# Patient Record
Sex: Male | Born: 1985 | Race: Black or African American | Hispanic: No | Marital: Single | Smoking: Current every day smoker
Health system: Southern US, Community
[De-identification: ages and names within clinical notes are randomized; demographics above are authoritative.]

## PROBLEM LIST (undated history)

## (undated) DIAGNOSIS — R112 Nausea with vomiting, unspecified: Secondary | ICD-10-CM

## (undated) DIAGNOSIS — F1290 Cannabis use, unspecified, uncomplicated: Secondary | ICD-10-CM

## (undated) DIAGNOSIS — B20 Human immunodeficiency virus [HIV] disease: Secondary | ICD-10-CM

## (undated) DIAGNOSIS — J45909 Unspecified asthma, uncomplicated: Secondary | ICD-10-CM

## (undated) DIAGNOSIS — K859 Acute pancreatitis without necrosis or infection, unspecified: Secondary | ICD-10-CM

## (undated) DIAGNOSIS — Z21 Asymptomatic human immunodeficiency virus [HIV] infection status: Secondary | ICD-10-CM

## (undated) DIAGNOSIS — F1910 Other psychoactive substance abuse, uncomplicated: Secondary | ICD-10-CM

## (undated) HISTORY — PX: NO PAST SURGERIES: SHX2092

---

## 2013-11-24 ENCOUNTER — Emergency Department: Payer: Self-pay | Admitting: Emergency Medicine

## 2013-11-24 LAB — COMPREHENSIVE METABOLIC PANEL
ALK PHOS: 76 U/L
ALT: 120 U/L — AB (ref 12–78)
Albumin: 4.2 g/dL (ref 3.4–5.0)
Anion Gap: 10 (ref 7–16)
BUN: 8 mg/dL (ref 7–18)
Bilirubin,Total: 0.5 mg/dL (ref 0.2–1.0)
CALCIUM: 9.6 mg/dL (ref 8.5–10.1)
CO2: 25 mmol/L (ref 21–32)
Chloride: 102 mmol/L (ref 98–107)
Creatinine: 1.31 mg/dL — ABNORMAL HIGH (ref 0.60–1.30)
EGFR (Non-African Amer.): >60
Glucose: 101 mg/dL — ABNORMAL HIGH (ref 65–99)
Osmolality: 272 (ref 275–301)
Potassium: 3.7 mmol/L (ref 3.5–5.1)
SGOT(AST): 135 U/L — ABNORMAL HIGH (ref 15–37)
Sodium: 137 mmol/L (ref 136–145)
Total Protein: 9.4 g/dL — ABNORMAL HIGH (ref 6.4–8.2)

## 2013-11-24 LAB — URINALYSIS, COMPLETE
BACTERIA: NONE SEEN
Blood: NEGATIVE
Glucose,UR: NEGATIVE mg/dL (ref 0–75)
Leukocyte Esterase: NEGATIVE
Nitrite: NEGATIVE
Ph: 6 (ref 4.5–8.0)
Protein: >=500
RBC,UR: 5 /HPF (ref 0–5)
SQUAMOUS EPITHELIAL: NONE SEEN
Specific Gravity: 1.03 (ref 1.003–1.030)
WBC UR: 1 /HPF (ref 0–5)

## 2013-11-24 LAB — CBC WITH DIFFERENTIAL/PLATELET
BASOS PCT: 0.5 %
Basophil #: 0.1 10*3/uL (ref 0.0–0.1)
EOS ABS: 0 10*3/uL (ref 0.0–0.7)
EOS PCT: 0.1 %
HCT: 51.1 % (ref 40.0–52.0)
HGB: 16.9 g/dL (ref 13.0–18.0)
Lymphocyte #: 6.4 10*3/uL — ABNORMAL HIGH (ref 1.0–3.6)
Lymphocyte %: 31.1 %
MCH: 29.8 pg (ref 26.0–34.0)
MCHC: 33 g/dL (ref 32.0–36.0)
MCV: 90 fL (ref 80–100)
MONO ABS: 1.3 x10 3/mm — AB (ref 0.2–1.0)
Monocyte %: 6.5 %
NEUTROS ABS: 12.8 10*3/uL — AB (ref 1.4–6.5)
Neutrophil %: 61.8 %
PLATELETS: 240 10*3/uL (ref 150–440)
RBC: 5.67 10*6/uL (ref 4.40–5.90)
RDW: 14.3 % (ref 11.5–14.5)
WBC: 20.7 10*3/uL — AB (ref 3.8–10.6)

## 2013-11-24 LAB — LIPASE, BLOOD: Lipase: 89 U/L (ref 73–393)

## 2013-11-24 LAB — ETHANOL
ETHANOL %: 0.041 % (ref 0.000–0.080)
ETHANOL LVL: 41 mg/dL

## 2014-07-27 ENCOUNTER — Encounter (HOSPITAL_COMMUNITY): Payer: Self-pay | Admitting: Emergency Medicine

## 2014-07-27 ENCOUNTER — Emergency Department (HOSPITAL_COMMUNITY): Payer: Self-pay

## 2014-07-27 ENCOUNTER — Emergency Department (HOSPITAL_COMMUNITY)
Admission: EM | Admit: 2014-07-27 | Discharge: 2014-07-27 | Disposition: A | Discharge status: Home / Self Care | Payer: Self-pay | Attending: Emergency Medicine | Admitting: Emergency Medicine

## 2014-07-27 DIAGNOSIS — J45909 Unspecified asthma, uncomplicated: Secondary | ICD-10-CM | POA: Insufficient documentation

## 2014-07-27 DIAGNOSIS — R1084 Generalized abdominal pain: Secondary | ICD-10-CM | POA: Insufficient documentation

## 2014-07-27 DIAGNOSIS — R197 Diarrhea, unspecified: Secondary | ICD-10-CM | POA: Insufficient documentation

## 2014-07-27 HISTORY — DX: Unspecified asthma, uncomplicated: J45.909

## 2014-07-27 LAB — GI PATHOGEN PANEL BY PCR, STOOL
C difficile toxin A/B: NEGATIVE
CAMPYLOBACTER BY PCR: NEGATIVE
Cryptosporidium by PCR: NEGATIVE
E coli (ETEC) LT/ST: NEGATIVE
E coli (STEC): NEGATIVE
E coli 0157 by PCR: NEGATIVE
G LAMBLIA BY PCR: NEGATIVE
NOROVIRUS G1/G2: NEGATIVE
ROTAVIRUS A BY PCR: NEGATIVE
SHIGELLA BY PCR: NEGATIVE
Salmonella by PCR: NEGATIVE

## 2014-07-27 LAB — COMPREHENSIVE METABOLIC PANEL
ALBUMIN: 3.6 g/dL (ref 3.5–5.2)
ALT: 22 U/L (ref 0–53)
AST: 34 U/L (ref 0–37)
Alkaline Phosphatase: 66 U/L (ref 39–117)
Anion gap: 9 (ref 5–15)
BUN: 13 mg/dL (ref 6–23)
CHLORIDE: 100 meq/L (ref 96–112)
CO2: 25 mEq/L (ref 19–32)
CREATININE: 1.1 mg/dL (ref 0.50–1.35)
Calcium: 9.4 mg/dL (ref 8.4–10.5)
GFR calc Af Amer: >90 mL/min (ref >90)
GFR calc non Af Amer: 89 mL/min — ABNORMAL LOW (ref >90)
Glucose, Bld: 110 mg/dL — ABNORMAL HIGH (ref 70–99)
Potassium: 4.4 mEq/L (ref 3.7–5.3)
Sodium: 134 mEq/L — ABNORMAL LOW (ref 137–147)
TOTAL PROTEIN: 8.5 g/dL — AB (ref 6.0–8.3)
Total Bilirubin: 0.4 mg/dL (ref 0.3–1.2)

## 2014-07-27 LAB — CBC WITH DIFFERENTIAL/PLATELET
BASOS ABS: 0 10*3/uL (ref 0.0–0.1)
Basophils Relative: 0 % (ref 0–1)
Eosinophils Absolute: 0 10*3/uL (ref 0.0–0.7)
Eosinophils Relative: 0 % (ref 0–5)
HCT: 49.8 % (ref 39.0–52.0)
Hemoglobin: 16.9 g/dL (ref 13.0–17.0)
Lymphocytes Relative: 35 % (ref 12–46)
Lymphs Abs: 3.7 10*3/uL (ref 0.7–4.0)
MCH: 30.2 pg (ref 26.0–34.0)
MCHC: 33.9 g/dL (ref 30.0–36.0)
MCV: 88.9 fL (ref 78.0–100.0)
MONOS PCT: 8 % (ref 3–12)
Monocytes Absolute: 0.8 10*3/uL (ref 0.1–1.0)
Neutro Abs: 6.1 10*3/uL (ref 1.7–7.7)
Neutrophils Relative %: 57 % (ref 43–77)
PLATELETS: 187 10*3/uL (ref 150–400)
RBC: 5.6 MIL/uL (ref 4.22–5.81)
RDW: 14 % (ref 11.5–15.5)
WBC: 10.6 10*3/uL — AB (ref 4.0–10.5)

## 2014-07-27 LAB — URINALYSIS, ROUTINE W REFLEX MICROSCOPIC
Bilirubin Urine: NEGATIVE
Glucose, UA: NEGATIVE mg/dL
Hgb urine dipstick: NEGATIVE
Ketones, ur: NEGATIVE mg/dL
Leukocytes, UA: NEGATIVE
Nitrite: NEGATIVE
PH: 7 (ref 5.0–8.0)
Protein, ur: NEGATIVE mg/dL
SPECIFIC GRAVITY, URINE: 1.025 (ref 1.005–1.030)
Urobilinogen, UA: 1 mg/dL (ref 0.0–1.0)

## 2014-07-27 LAB — LIPASE, BLOOD: LIPASE: 42 U/L (ref 11–59)

## 2014-07-27 LAB — CLOSTRIDIUM DIFFICILE BY PCR: CDIFFPCR: NEGATIVE

## 2014-07-27 LAB — POC OCCULT BLOOD, ED: FECAL OCCULT BLD: NEGATIVE

## 2014-07-27 NOTE — ED Notes (Signed)
PCR stool collected via stool rectum. 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

## 2014-07-27 NOTE — Discharge Instructions (Signed)
°Emergency Department Resource Guide °1) Find a Doctor and Pay Out of Pocket °Although you won't have to find out who is covered by your insurance plan, it is a good idea to ask around and get recommendations. You will then need to call the office and see if the doctor you have chosen will accept you as a new patient and what types of options they offer for patients who are self-pay. Some doctors offer discounts or will set up payment plans for their patients who do not have insurance, but you will need to ask so you aren't surprised when you get to your appointment. ° °2) Contact Your Local Health Department °Not all health departments have doctors that can see patients for sick visits, but many do, so it is worth a call to see if yours does. If you don't know where your local health department is, you can check in your phone book. The CDC also has a tool to help you locate your state's health department, and many state websites also have listings of all of their local health departments. ° °3) Find a Walk-in Clinic °If your illness is not likely to be very severe or complicated, you may want to try a walk in clinic. These are popping up all over the country in pharmacies, drugstores, and shopping centers. They're usually staffed by nurse practitioners or physician assistants that have been trained to treat common illnesses and complaints. They're usually fairly quick and inexpensive. However, if you have serious medical issues or chronic medical problems, these are probably not your best option. ° °No Primary Care Doctor: °- Call [facility] at  832-8000 - they can help you locate a primary care doctor that  accepts your insurance, provides certain services, etc. °- Physician Referral Service- 1-800-533-3463 ° °Chronic Pain Problems: °Organization         Address  Phone   Notes  °[facility]  (336) 297-2271 Patients need to be referred by their primary care doctor.  ° °Medication  Assistance: °Organization         Address  Phone   Notes  °[place] [facility] [address] °[place], [place] [address] (336) 641-8030 --Must be a resident of [place] °-- Must have NO insurance coverage whatsoever (no Medicaid/ Medicare, etc.) °-- The pt. MUST have a primary care doctor that directs their care regularly and follows them in the community °  °[facility]  (866) 331-1348   °[employer]  (888) 892-1162   ° °Agencies that provide inexpensive medical care: °Organization         Address  Phone   Notes  °[facility]  (336) 832-8035   °[facility]    (336) 832-7272   °[facility] [address] °[place], [place] [address] (336) 832-4777   °[facility] [address], °[place] (336) 271-4999   °[facility]    (336) 373-0678   °[facility]    (336) 272-1050   °[facility] ° [address], [place] Phone:  (336) 832-4444, Fax:  (336) 832-4440 Hours of Operation:  9 am - 6 pm, M-F.  Also accepts Medicaid/Medicare and self-pay.  °[facility] ° [address], [place] Phone: (336) 832-3150, Fax: (336) [phone]. Hours of Operation:  8:30 am - 5:30 pm, M-F.  Also accepts Medicaid and self-pay.  °[facility] [place] [address], [place] Phone: (336) 878-6027   °[facility] [address], [place], [place] (336)723-1848, Ext. 123 Mondays & Thursdays: 7-9 AM.  First 15 patients are seen on a first come, first serve basis. °  ° °Medicaid-accepting [place] Providers: ° °Organization         Address  Phone   Notes  °[facility] [address], [place] (336) 641-2100 Also accepts self-pay patients.  °[facility] [address], [place] ° (336) 856-9996   °[facility] [address], [place]  (336) 288-8857   °[facility] [address] [place] [address], [place] (336) 299-7000   °Veita Bland [address], [place]  ° (336) 373-1557 Only accepts [place] Access Medicaid patients after they have their name applied to their card.  ° °Self-Pay (no insurance) in [place]: ° °Organization         Address  Phone   Notes  °Sickle Cell Patients, [facility] [address], [place] (336) 832-1970   °[facility] [address], [place] (336) 832-4400   °[facility] ° [address], [place] (336) 992-4800   °[facility]/Dr. Osei-Bonsu ° [address] [place] [address], [place] or [address], [place] (336) 841-8500 Phone number for both [place] and [place] locations is the same.  °[facility] [address], [place] (336) 299-0000   °[facility] [address] [place] [address], [place] or [address] (336) 852-7530 °(336) 878-2260   °[facility] [address], [place] (336) 350-1642, phone; (336) 294-5005, fax Sees patients 1st and 3rd Saturday of every month.  Must not qualify for public or private insurance (i.e. Medicaid, Medicare, [place] Health Choice, Veterans' Benefits) • Household income should be no more than 200% of the poverty level •The clinic cannot treat you if you are pregnant or think you are pregnant • Sexually transmitted diseases are not treated at the clinic.  ° ° °Dental Care: °Organization         Address  Phone  Notes  °[place] [facility] [address], [place] (336) 641-6152 Accepts children up to age 21 who are enrolled in Medicaid or [place] Health Choice; pregnant women with a Medicaid card; and children who have applied for Medicaid or [place] Health Choice, but were declined, whose parents can pay a reduced fee at time of service.  °[place]  [facility] [place]  [address], [place] (336) 641-7733 Accepts children up to age 21 who are enrolled in Medicaid or [place] Health Choice; pregnant women with a Medicaid card; and children who have applied for Medicaid or [place] Health Choice, but were declined, whose parents can pay a reduced fee at time of service.  °[facility] ° [address], [place] (336) 641-4533 Patients are seen by appointment only. Walk-ins are not accepted. [facility] will see patients 18 years of age and older. °Monday - Tuesday (8am-5pm) °Most Wednesdays (8:30-5pm) °$30 per visit, cash only  °[facility] ° [address], [place] (336) 641-4533 Patients are seen by appointment only. Walk-ins are not accepted. [facility] will see patients 18 years of age and older. °One   Wednesday Evening (Monthly: Volunteer Based).  $30 per visit, cash only  °[facility]  (919) 537-3737 for adults; Children under age 4, call Graduate Pediatric Dentistry at ([phone]) [phone]. Children aged 4-14, please call ([phone]) [phone] to request a pediatric application. ° Dental services are provided in all areas of dental care including fillings, crowns and bridges, complete and partial dentures, implants, gum treatment, root canals, and extractions. Preventive care is also provided. Treatment is provided to both adults and children. °Patients are selected via a lottery and there is often a waiting list. °  °[facility] [address], °[place] ° (336) 763-8833 [email or web address] °  °[facility] [address], [place], [place] (336)723-1848, Ext. 123 Second and Fourth Thursday of each month, opens at 6:30 AM; Clinic ends at 9 AM.  Patients are seen on a first-come first-served basis, and a limited number are seen during each clinic.  ° °[facility] ° [address], [place], [place] (336) 723-7904    Eligibility Requirements °You must have lived in [place], [place], or [place] counties for at least the last three months. °  You cannot be eligible for state or federal sponsored healthcare insurance, including Veterans Administration, Medicaid, or Medicare. °  You generally cannot be eligible for healthcare insurance through your employer.  °  How to apply: °Eligibility screenings are held every Tuesday and Wednesday afternoon from 1:00 pm until 4:00 pm. You do not need an appointment for the interview!  °[facility] [address], [place], [place] 336-631-2330   °[facility]  336-342-8273   °[facility]  336-703-3100   °[facility]  336-570-6415   ° °Behavioral Health Resources in the Community: °Intensive Outpatient Programs °Organization         Address  Phone  Notes  °[facility] [address], [place], [place] 336-878-6098   °[facility] [address], [place], [place] 336-832-9800   °[facility] [address], [place], [place] ° 336-882-2125   °[facility] [address],  °[place], [place] 1-800-853-5163 or 336-641-4981   °Substance Abuse Resources °Organization         Address  Phone  Notes  °[facility]  336-882-2125   °[facility]  336-784-9470   °[facility]  336-285-9073   °[facility]  336-845-3988   °Residential & Outpatient Substance Abuse Program  1-800-659-3381   °Psychological Services °Organization         Address  Phone  Notes  °[facility]  336- 832-9600   °[facility]  336- 378-7881   °[facility] [address], [place] 1-800-853-5163 or 336-641-4981   ° °Mobile Crisis Teams °Organization         Address  Phone  Notes  °[facility], Mobile Crisis Care Unit  1-877-626-1772   °Assertive °[facility] ° [address]  [place], [place] 336-834-9664   °Sharon DeEsch [address] °[place] 336-554-5454   ° °Self-Help/Support Groups °Organization         Address  Phone             Notes  °[facility] of  - variety of support groups  336- 373-1402 Call for more information  °Narcotics Anonymous (NA), [facility] [address], °[place]  2 meetings at this location  ° °  Residential Treatment Programs Organization         Address  Phone  Notes  [facility] [address],    [place]  [phone]   [facility]  [address], [place] [phone], [place], [place] [phone]   [facility] [address], [place] [phone] Admissions: 8am-3pm M-F  [facility] [address]    [place], [place] [phone]   [facility] [address], [place], [place] [phone]   [facility] [address]  [place], [place] [phone]   [facility] Intensive Outpatient [address], [place], [place] [phone]   [facility] [address]  [place], [place] [phone] or [phone]   [facility] [address] [place], [place] [phone] Accepts Medicaid  [facility] [address]  [place] [phone] Substance Abuse/Addiction Treatment   [facility]         Address  Phone  Notes  [facility]  [phone](888) [phone]   Angie FavaJulie Brannon, PhD [address], [address] [place], [place]   [phone](336) (212)[phone] or [phone](336) [phone]   [facility]   [address] [place], [place] [phone](336) [phone]   [facility] [address], [place], [place] ([phone]) [phone](336) [phone] Insurance/Medicaid/sponsorship through [facility] [address]                                    [place], [place] [phone]) [phone] Therapy/tele-psych/case    [facility] [address]   [place], [place] [phone]) [phone]    Dr. Lolly MustacheArfeen  [phone]) [phone]   [facility] [place]  [facility] 1) [address], [place] 2) [address], [place] 3)  [address], [place] [phone](336) [phone](336) [phone]  [phone](336) [phone]   [facility] Child Abuse Hotline 607-325-0203(336) 620-316-6417 or (915) 191-1733(336) [phone] (After Hours)      May take over the counter anti-diarrheal medicine, as directed on packaging, as needed for diarrhea. Increase your fluid intake (ie:  Gatoraide) for the next few days, as discussed. Avoid full strength juices, as well as milk and milk products until your diarrhea has resolved.  Call your regular medical doctor today to schedule a follow up appointment this week.  Return to the Emergency Department immediately if not improving (or even worsening) despite taking the medicines as prescribed, any black or bloody stool or vomit, if you develop a fever over "101," or for any other concerns.

## 2014-07-27 NOTE — ED Provider Notes (Signed)
CSN: [id number]     Arrival date & time 07/27/14  1011 History   First MD Initiated Contact with Patient 07/27/14 1028     Chief Complaint  Patient presents with  . Abdominal Pain  . Diarrhea     HPI  Pt was seen at 1055. Per pt, c/o gradual onset and persistence of multiple intermittent episodes of diarrhea that began 2 days ago. Describes the stools as "watery" yesterday, but "more formed" today. Has been associated with generalized abd "gurgling" discomfort. Pt had one episode of N/V yesterday, none since. Denies CP/SOB, no back pain, no fevers, no black or blood in stools or emesis.    Past Medical History  Diagnosis Date  . Asthma    History reviewed. No pertinent past surgical history.  History  Substance Use Topics  . Smoking status: Never Smoker   . Smokeless tobacco: Not on file  . Alcohol Use: Yes    Review of Systems ROS: Statement: All systems negative except as marked or noted in the HPI; Constitutional: Negative for fever and chills. ; ; Eyes: Negative for eye pain, redness and discharge. ; ; ENMT: Negative for ear pain, hoarseness, nasal congestion, sinus pressure and sore throat. ; ; Cardiovascular: Negative for chest pain, palpitations, diaphoresis, dyspnea and peripheral edema. ; ; Respiratory: Negative for cough, wheezing and stridor. ; ; Gastrointestinal: +abd pain, diarrhea, N/V x1. Negative for blood in stool, hematemesis, jaundice and rectal bleeding. . ; ; Genitourinary: Negative for dysuria, flank pain and hematuria. ; ; Musculoskeletal: Negative for back pain and neck pain. Negative for swelling and trauma.; ; Skin: Negative for pruritus, rash, abrasions, blisters, bruising and skin lesion.; ; Neuro: Negative for headache, lightheadedness and neck stiffness. Negative for weakness, altered level of consciousness , altered mental status, extremity weakness, paresthesias, involuntary movement, seizure and syncope.      Allergies  Review of patient's allergies  indicates no known allergies.  Home Medications   Prior to Admission medications   Not on File   BP 130/84 mmHg  Pulse 81  Temp(Src) 98.1 F (36.7 C) (Oral)  Resp 18  SpO2 100% Physical Exam  1100: Physical examination:  Nursing notes reviewed; Vital signs and O2 SAT reviewed;  Constitutional: Well developed, Well nourished, Well hydrated, In no acute distress; Head:  Normocephalic, atraumatic; Eyes: EOMI, PERRL, No scleral icterus; ENMT: Mouth and pharynx normal, Mucous membranes moist; Neck: Supple, Full range of motion, No lymphadenopathy; Cardiovascular: Regular rate and rhythm, No murmur, rub, or gallop; Respiratory: Breath sounds clear & equal bilaterally, No rales, rhonchi, wheezes.  Speaking full sentences with ease, Normal respiratory effort/excursion; Chest: Nontender, Movement normal; Abdomen: Soft, Nontender, Nondistended, Normal bowel sounds; Genitourinary: No CVA tenderness; Extremities: Pulses normal, No tenderness, No edema, No calf edema or asymmetry.; Neuro: AA&Ox3, Major CN grossly intact.  Speech clear. No gross focal motor or sensory deficits in extremities. Climbs on and off stretcher easily by himself. Gait steady.; Skin: Color normal, Warm, Dry.   ED Course  Procedures   MDM  MDM Reviewed: previous chart, nursing note and vitals Interpretation: labs and x-ray     Results for orders placed or performed during the hospital encounter of 07/27/14  CBC with Differential  Result Value Ref Range   WBC 10.6 (H) 4.0 - 10.5 K/uL   RBC 5.60 4.22 - 5.81 MIL/uL   Hemoglobin 16.9 13.0 - 17.0 g/dL   HCT 40.949.8 81.139.0 - 91.452.0 %   MCV 88.9 78.0 - 100.0 fL  MCH 30.2 26.0 - 34.0 pg   MCHC 33.9 30.0 - 36.0 g/dL   RDW 40.914.0 81.111.5 - 91.415.5 %   Platelets 187 150 - 400 K/uL   Neutrophils Relative % 57 43 - 77 %   Lymphocytes Relative 35 12 - 46 %   Monocytes Relative 8 3 - 12 %   Eosinophils Relative 0 0 - 5 %   Basophils Relative 0 0 - 1 %   Neutro Abs 6.1 1.7 - 7.7 K/uL    Lymphs Abs 3.7 0.7 - 4.0 K/uL   Monocytes Absolute 0.8 0.1 - 1.0 K/uL   Eosinophils Absolute 0.0 0.0 - 0.7 K/uL   Basophils Absolute 0.0 0.0 - 0.1 K/uL   Smear Review MORPHOLOGY UNREMARKABLE   Comprehensive metabolic panel  Result Value Ref Range   Sodium 134 (L) 137 - 147 mEq/L   Potassium 4.4 3.7 - 5.3 mEq/L   Chloride 100 96 - 112 mEq/L   CO2 25 19 - 32 mEq/L   Glucose, Bld 110 (H) 70 - 99 mg/dL   BUN 13 6 - 23 mg/dL   Creatinine, Ser 7.821.10 0.50 - 1.35 mg/dL   Calcium 9.4 8.4 - 95.610.5 mg/dL   Total Protein 8.5 (H) 6.0 - 8.3 g/dL   Albumin 3.6 3.5 - 5.2 g/dL   AST 34 0 - 37 U/L   ALT 22 0 - 53 U/L   Alkaline Phosphatase 66 39 - 117 U/L   Total Bilirubin 0.4 0.3 - 1.2 mg/dL   GFR calc non Af Amer 89 (L) >90 mL/min   GFR calc Af Amer >90 >90 mL/min   Anion gap 9 5 - 15  Lipase, blood  Result Value Ref Range   Lipase 42 11 - 59 U/L  Urinalysis, Routine w reflex microscopic  Result Value Ref Range   Color, Urine YELLOW YELLOW   APPearance CLEAR CLEAR   Specific Gravity, Urine 1.025 1.005 - 1.030   pH 7.0 5.0 - 8.0   Glucose, UA NEGATIVE NEGATIVE mg/dL   Hgb urine dipstick NEGATIVE NEGATIVE   Bilirubin Urine NEGATIVE NEGATIVE   Ketones, ur NEGATIVE NEGATIVE mg/dL   Protein, ur NEGATIVE NEGATIVE mg/dL   Urobilinogen, UA 1.0 0.0 - 1.0 mg/dL   Nitrite NEGATIVE NEGATIVE   Leukocytes, UA NEGATIVE NEGATIVE  POC occult blood, ED  Result Value Ref Range   Fecal Occult Bld NEGATIVE NEGATIVE   Dg Abd Acute W/chest 07/27/2014   CLINICAL DATA:  Abdominal pain and diarrhea for 2 days.  EXAM: ACUTE ABDOMEN SERIES (ABDOMEN 2 VIEW & CHEST 1 VIEW)  COMPARISON:  None.  FINDINGS: The upright chest x-ray is normal.  No pleural effusion.  Two views of the abdomen demonstrate an unremarkable bowel gas pattern. No findings for obstruction or perforation. The soft tissue shadows are maintained. No worrisome calcifications. The bony structures are intact.  IMPRESSION: No acute cardiopulmonary  findings.  No plain film findings for an acute abdominal process.   Electronically Signed   By: Loralie ChampagneMark  Gallerani M.D.   On: 07/27/2014 12:10    1345:  Pt has tol PO well while in the ED without N/V.  Has stooled several times while in the ED: GI pathogen panel and cdiff PCR pending.  Abd remains benign, VSS. Feels better and wants to go home now. Dx and testing d/w pt.  Questions answered.  Verb understanding, agreeable to d/c home with outpt f/u.      Samuel JesterKathleen Kit Mollett, DO 07/29/14 [phone]

## 2014-07-27 NOTE — ED Notes (Signed)
Pt drinks ice water without complaint.

## 2014-07-27 NOTE — ED Notes (Signed)
Pt c/o abd pain, n/v/d for 2-3 days.  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  Pt states that he gets done having BM then few minutes later having to be back in the bathroom going again.

## 2015-01-04 ENCOUNTER — Encounter (HOSPITAL_BASED_OUTPATIENT_CLINIC_OR_DEPARTMENT_OTHER): Payer: Self-pay | Admitting: *Deleted

## 2015-01-04 ENCOUNTER — Emergency Department (HOSPITAL_BASED_OUTPATIENT_CLINIC_OR_DEPARTMENT_OTHER)
Admission: EM | Admit: 2015-01-04 | Discharge: 2015-01-04 | Disposition: A | Discharge status: Home / Self Care | Payer: Self-pay | Attending: Emergency Medicine | Admitting: Emergency Medicine

## 2015-01-04 DIAGNOSIS — F1010 Alcohol abuse, uncomplicated: Secondary | ICD-10-CM | POA: Insufficient documentation

## 2015-01-04 DIAGNOSIS — Z21 Asymptomatic human immunodeficiency virus [HIV] infection status: Secondary | ICD-10-CM | POA: Insufficient documentation

## 2015-01-04 DIAGNOSIS — Z720 Tobacco use: Secondary | ICD-10-CM | POA: Insufficient documentation

## 2015-01-04 DIAGNOSIS — J45909 Unspecified asthma, uncomplicated: Secondary | ICD-10-CM | POA: Insufficient documentation

## 2015-01-04 DIAGNOSIS — Z79899 Other long term (current) drug therapy: Secondary | ICD-10-CM | POA: Insufficient documentation

## 2015-01-04 DIAGNOSIS — K2920 Alcoholic gastritis without bleeding: Secondary | ICD-10-CM | POA: Insufficient documentation

## 2015-01-04 HISTORY — DX: Human immunodeficiency virus (HIV) disease: B20

## 2015-01-04 HISTORY — DX: Asymptomatic human immunodeficiency virus (hiv) infection status: Z21

## 2015-01-04 LAB — URINALYSIS, ROUTINE W REFLEX MICROSCOPIC
BILIRUBIN URINE: NEGATIVE
GLUCOSE, UA: NEGATIVE mg/dL
Ketones, ur: >80 mg/dL — AB
Leukocytes, UA: NEGATIVE
Nitrite: NEGATIVE
PH: 7 (ref 5.0–8.0)
Protein, ur: >300 mg/dL — AB
Specific Gravity, Urine: 1.029 (ref 1.005–1.030)
Urobilinogen, UA: 1 mg/dL (ref 0.0–1.0)

## 2015-01-04 LAB — COMPREHENSIVE METABOLIC PANEL
ALK PHOS: 52 U/L (ref 39–117)
ALT: 57 U/L — ABNORMAL HIGH (ref 0–53)
ANION GAP: 13 (ref 5–15)
AST: 67 U/L — ABNORMAL HIGH (ref 0–37)
Albumin: 4.6 g/dL (ref 3.5–5.2)
BUN: 11 mg/dL (ref 6–23)
CHLORIDE: 103 mmol/L (ref 96–112)
CO2: 22 mmol/L (ref 19–32)
Calcium: 9.4 mg/dL (ref 8.4–10.5)
Creatinine, Ser: 1.09 mg/dL (ref 0.50–1.35)
GFR calc Af Amer: >90 mL/min (ref >90)
GFR calc non Af Amer: 90 mL/min — ABNORMAL LOW (ref >90)
Glucose, Bld: 117 mg/dL — ABNORMAL HIGH (ref 70–99)
POTASSIUM: 3.8 mmol/L (ref 3.5–5.1)
SODIUM: 138 mmol/L (ref 135–145)
TOTAL PROTEIN: 9.4 g/dL — AB (ref 6.0–8.3)
Total Bilirubin: 0.7 mg/dL (ref 0.3–1.2)

## 2015-01-04 LAB — CBC WITH DIFFERENTIAL/PLATELET
BASOS ABS: 0 10*3/uL (ref 0.0–0.1)
BASOS PCT: 0 % (ref 0–1)
Eosinophils Absolute: 0 10*3/uL (ref 0.0–0.7)
Eosinophils Relative: 0 % (ref 0–5)
HEMATOCRIT: 44.3 % (ref 39.0–52.0)
Hemoglobin: 15.9 g/dL (ref 13.0–17.0)
LYMPHS ABS: 1.6 10*3/uL (ref 0.7–4.0)
Lymphocytes Relative: 16 % (ref 12–46)
MCH: 30.5 pg (ref 26.0–34.0)
MCHC: 35.9 g/dL (ref 30.0–36.0)
MCV: 84.9 fL (ref 78.0–100.0)
MONOS PCT: 4 % (ref 3–12)
Monocytes Absolute: 0.4 10*3/uL (ref 0.1–1.0)
NEUTROS ABS: 8.3 10*3/uL — AB (ref 1.7–7.7)
Neutrophils Relative %: 80 % — ABNORMAL HIGH (ref 43–77)
Platelets: 198 10*3/uL (ref 150–400)
RBC: 5.22 MIL/uL (ref 4.22–5.81)
RDW: 13.1 % (ref 11.5–15.5)
WBC: 10.3 10*3/uL (ref 4.0–10.5)

## 2015-01-04 LAB — I-STAT CG4 LACTIC ACID, ED: LACTIC ACID, VENOUS: 2.97 mmol/L — AB (ref 0.5–2.0)

## 2015-01-04 LAB — URINE MICROSCOPIC-ADD ON

## 2015-01-04 LAB — LIPASE, BLOOD: Lipase: 21 U/L (ref 11–59)

## 2015-01-04 MED ORDER — SODIUM CHLORIDE 0.9 % IV BOLUS (SEPSIS)
1000.0000 mL | Freq: Once | INTRAVENOUS | Status: AC
  Administered 2015-01-04: 1000 mL via INTRAVENOUS

## 2015-01-04 MED ORDER — RANITIDINE HCL 150 MG PO TABS
150.0000 mg | ORAL_TABLET | Freq: Two times a day (BID) | ORAL | Status: DC
Start: 2015-01-04 — End: 2017-04-23

## 2015-01-04 MED ORDER — ONDANSETRON HCL 4 MG/2ML IJ SOLN
4.0000 mg | Freq: Once | INTRAMUSCULAR | Status: AC
  Administered 2015-01-04: 4 mg via INTRAVENOUS
  Filled 2015-01-04: qty 2

## 2015-01-04 MED ORDER — FAMOTIDINE IN NACL 20-0.9 MG/50ML-% IV SOLN
20.0000 mg | Freq: Once | INTRAVENOUS | Status: AC
  Administered 2015-01-04: 20 mg via INTRAVENOUS
  Filled 2015-01-04: qty 50

## 2015-01-04 MED ORDER — HYDROMORPHONE HCL 1 MG/ML IJ SOLN
1.0000 mg | Freq: Once | INTRAMUSCULAR | Status: AC
  Administered 2015-01-04: 1 mg via INTRAVENOUS
  Filled 2015-01-04: qty 1

## 2015-01-04 NOTE — Discharge Instructions (Signed)

## 2015-01-04 NOTE — ED Provider Notes (Addendum)
CSN: [id number]     Arrival date & time 01/04/15  1623 History   First MD Initiated Contact with Patient 01/04/15 1636     Chief Complaint  Patient presents with  . Abdominal Pain     (Consider location/radiation/quality/duration/timing/severity/associated sxs/prior Treatment) Patient is a 29 y.o. male presenting with abdominal pain. The history is provided by the patient.  Abdominal Pain Pain location:  Epigastric, LUQ and RUQ Pain quality: aching, burning, gnawing and sharp   Pain radiates to:  Does not radiate Pain severity:  Severe Onset quality:  Gradual Duration:  2 days Timing:  Constant Progression:  Worsening Chronicity:  New Context: alcohol use   Context comment:  Pain started after drinking a 5th of vodka Relieved by:  Nothing Worsened by:  Eating Ineffective treatments:  Liquids Associated symptoms: anorexia, cough, diarrhea, nausea and vomiting   Associated symptoms: no chest pain, no dysuria, no fever and no shortness of breath   Associated symptoms comment:  Some blood tinged vomit. No frank blood Risk factors: alcohol abuse   Risk factors: has not had multiple surgeries, no NSAID use and no recent hospitalization     Past Medical History  Diagnosis Date  . Asthma   . HIV (human immunodeficiency virus infection)    History reviewed. No pertinent past surgical history. History reviewed. No pertinent family history. History  Substance Use Topics  . Smoking status: Current Every Day Smoker -- 1.00 packs/day    Types: Cigarettes  . Smokeless tobacco: Not on file  . Alcohol Use: Yes    Review of Systems  Constitutional: Negative for fever.  Respiratory: Positive for cough. Negative for shortness of breath.   Cardiovascular: Negative for chest pain.  Gastrointestinal: Positive for nausea, vomiting, abdominal pain, diarrhea and anorexia.  Genitourinary: Negative for dysuria.  All other systems reviewed and are negative.     Allergies  Review of  patient's allergies indicates no known allergies.  Home Medications   Prior to Admission medications   Medication Sig Start Date End Date Taking? Authorizing Provider  albuterol (PROVENTIL HFA;VENTOLIN HFA) 108 (90 BASE) MCG/ACT inhaler Inhale 2 puffs into the lungs every 6 (six) hours as needed for wheezing or shortness of breath.   Yes Historical Provider, MD   BP 129/77 mmHg  Pulse 58  Temp(Src) 97.6 F (36.4 C)  Resp 28  Ht  (1.854 m)  Wt 180 lb (81.647 kg)  BMI 23.75 kg/m2  SpO2 99% Physical Exam  Constitutional: He is oriented to person, place, and time. He appears well-developed and well-nourished. No distress.  HENT:  Head: Normocephalic and atraumatic.  Mouth/Throat: Oropharynx is clear and moist. Mucous membranes are dry.  Eyes: Conjunctivae and EOM are normal. Pupils are equal, round, and reactive to light.  Neck: Normal range of motion. Neck supple.  Cardiovascular: Normal rate, regular rhythm and intact distal pulses.   No murmur heard. Pulmonary/Chest: Effort normal and breath sounds normal. No respiratory distress. He has no wheezes. He has no rales.  Abdominal: Soft. Normal appearance and bowel sounds are normal. He exhibits no distension. There is tenderness in the epigastric area and left upper quadrant. There is guarding. There is no rebound, no CVA tenderness and negative Murphy's sign.  Musculoskeletal: Normal range of motion. He exhibits no edema or tenderness.  Neurological: He is alert and oriented to person, place, and time.  Skin: Skin is warm and dry. No rash noted. No erythema.  Psychiatric: He has a normal mood and affect. His  behavior is normal.  Nursing note and vitals reviewed.   ED Course  Procedures (including critical care time) Labs Review Labs Reviewed  CBC WITH DIFFERENTIAL/PLATELET - Abnormal; Notable for the following:    Neutrophils Relative % 80 (*)    Neutro Abs 8.3 (*)    All other components within normal limits   COMPREHENSIVE METABOLIC PANEL - Abnormal; Notable for the following:    Glucose, Bld 117 (*)    Total Protein 9.4 (*)    AST 67 (*)    ALT 57 (*)    GFR calc non Af Amer 90 (*)    All other components within normal limits  URINALYSIS, ROUTINE W REFLEX MICROSCOPIC - Abnormal; Notable for the following:    Hgb urine dipstick MODERATE (*)    Ketones, ur >80 (*)    Protein, ur >300 (*)    All other components within normal limits  I-STAT CG4 LACTIC ACID, ED - Abnormal; Notable for the following:    Lactic Acid, Venous 2.97 (*)    All other components within normal limits  LIPASE, BLOOD  URINE MICROSCOPIC-ADD ON    Imaging Review No results found.   EKG Interpretation None      MDM   Final diagnoses:  Alcoholic gastritis    Patient here complaining of abdominal pain that started yesterday after drinking a substantial amount of alcohol. He has had excessive vomiting some blood tinged and minimal diarrhea with significant abdominal tenderness. [facility] that after waiting for 4 hours he came here for further evaluation. He currently denies taking any medication and has a history of asthma and HIV. On exam he has significant epigastric and left upper quadrant tenderness concerning for alcoholic gastritis versus pancreatitis. Low suspicion at this time for appendicitis or diverticulitis.  Patient given IV fluids, pain, nausea medication as well as Pepcid. CBC, CMP, lipase, UA, lactate pending  5:43 PM Lactic acid is mildly elevated at 2.9. Urine with greater than 80 ketones but otherwise LFTs only mildly elevated compared to prior with normal renal function and lipase normal. Feel most likely that patient has alcoholic gastritis. We'll continue to treat symptomatically  6:10 PM On reevaluation patient is feeling much better. He's tolerating by mouth's and requesting to go home. He will start on a two-week course of Zantac and stop alcohol  consumption.  Gwyneth SproutWhitney Cymone Yeske, MD 01/04/15 1811  Gwyneth SproutWhitney Mateus Rewerts, MD 01/04/15 [phone]

## 2015-01-04 NOTE — ED Notes (Signed)
Pt c/o abd pain with n/v/d x 2 days.  

## 2017-04-22 ENCOUNTER — Encounter (HOSPITAL_BASED_OUTPATIENT_CLINIC_OR_DEPARTMENT_OTHER): Payer: Self-pay | Admitting: *Deleted

## 2017-04-22 ENCOUNTER — Emergency Department (HOSPITAL_BASED_OUTPATIENT_CLINIC_OR_DEPARTMENT_OTHER)
Admission: EM | Admit: 2017-04-22 | Discharge: 2017-04-23 | Disposition: A | Discharge status: Home / Self Care | Payer: Self-pay | Attending: Emergency Medicine | Admitting: Emergency Medicine

## 2017-04-22 DIAGNOSIS — F17210 Nicotine dependence, cigarettes, uncomplicated: Secondary | ICD-10-CM | POA: Insufficient documentation

## 2017-04-22 DIAGNOSIS — R600 Localized edema: Secondary | ICD-10-CM | POA: Insufficient documentation

## 2017-04-22 DIAGNOSIS — K047 Periapical abscess without sinus: Secondary | ICD-10-CM

## 2017-04-22 DIAGNOSIS — J45909 Unspecified asthma, uncomplicated: Secondary | ICD-10-CM | POA: Insufficient documentation

## 2017-04-22 DIAGNOSIS — B20 Human immunodeficiency virus [HIV] disease: Secondary | ICD-10-CM | POA: Insufficient documentation

## 2017-04-22 NOTE — ED Triage Notes (Signed)
Pt reports left sided dental pain, facial swelling, pain that radiates up the left side of his face for several days. Denies fevers

## 2017-04-23 ENCOUNTER — Emergency Department (HOSPITAL_BASED_OUTPATIENT_CLINIC_OR_DEPARTMENT_OTHER)
Admission: EM | Admit: 2017-04-23 | Discharge: 2017-04-23 | Disposition: A | Discharge status: Home / Self Care | Payer: Self-pay | Attending: Emergency Medicine | Admitting: Emergency Medicine

## 2017-04-23 ENCOUNTER — Encounter (HOSPITAL_BASED_OUTPATIENT_CLINIC_OR_DEPARTMENT_OTHER): Payer: Self-pay

## 2017-04-23 ENCOUNTER — Emergency Department (HOSPITAL_BASED_OUTPATIENT_CLINIC_OR_DEPARTMENT_OTHER): Payer: Self-pay

## 2017-04-23 DIAGNOSIS — J45909 Unspecified asthma, uncomplicated: Secondary | ICD-10-CM | POA: Insufficient documentation

## 2017-04-23 DIAGNOSIS — K047 Periapical abscess without sinus: Secondary | ICD-10-CM | POA: Insufficient documentation

## 2017-04-23 DIAGNOSIS — F17210 Nicotine dependence, cigarettes, uncomplicated: Secondary | ICD-10-CM | POA: Insufficient documentation

## 2017-04-23 LAB — CBC
HEMATOCRIT: 39.9 % (ref 39.0–52.0)
HEMOGLOBIN: 13.5 g/dL (ref 13.0–17.0)
MCH: 29.7 pg (ref 26.0–34.0)
MCHC: 33.8 g/dL (ref 30.0–36.0)
MCV: 87.9 fL (ref 78.0–100.0)
Platelets: 214 10*3/uL (ref 150–400)
RBC: 4.54 MIL/uL (ref 4.22–5.81)
RDW: 14.6 % (ref 11.5–15.5)
WBC: 14 10*3/uL — ABNORMAL HIGH (ref 4.0–10.5)

## 2017-04-23 LAB — CBC WITH DIFFERENTIAL/PLATELET
BASOS PCT: 0 %
Basophils Absolute: 0 10*3/uL (ref 0.0–0.1)
EOS PCT: 1 %
Eosinophils Absolute: 0.1 10*3/uL (ref 0.0–0.7)
HCT: 38 % — ABNORMAL LOW (ref 39.0–52.0)
Hemoglobin: 12.9 g/dL — ABNORMAL LOW (ref 13.0–17.0)
Lymphocytes Relative: 25 %
Lymphs Abs: 2.6 10*3/uL (ref 0.7–4.0)
MCH: 29.7 pg (ref 26.0–34.0)
MCHC: 33.9 g/dL (ref 30.0–36.0)
MCV: 87.4 fL (ref 78.0–100.0)
MONO ABS: 0.9 10*3/uL (ref 0.1–1.0)
Monocytes Relative: 8 %
Neutro Abs: 7.1 10*3/uL (ref 1.7–7.7)
Neutrophils Relative %: 66 %
Platelets: 217 10*3/uL (ref 150–400)
RBC: 4.35 MIL/uL (ref 4.22–5.81)
RDW: 14.2 % (ref 11.5–15.5)
WBC: 10.7 10*3/uL — ABNORMAL HIGH (ref 4.0–10.5)

## 2017-04-23 LAB — BASIC METABOLIC PANEL
Anion gap: 7 (ref 5–15)
Anion gap: 8 (ref 5–15)
BUN: 12 mg/dL (ref 6–20)
BUN: 9 mg/dL (ref 6–20)
CALCIUM: 9.2 mg/dL (ref 8.9–10.3)
CHLORIDE: 101 mmol/L (ref 101–111)
CO2: 26 mmol/L (ref 22–32)
CO2: 28 mmol/L (ref 22–32)
CREATININE: 1.32 mg/dL — AB (ref 0.61–1.24)
CREATININE: 1.35 mg/dL — AB (ref 0.61–1.24)
Calcium: 8.9 mg/dL (ref 8.9–10.3)
Chloride: 102 mmol/L (ref 101–111)
GFR calc Af Amer: >60 mL/min (ref >60)
GFR calc non Af Amer: >60 mL/min (ref >60)
GFR calc non Af Amer: >60 mL/min (ref >60)
GLUCOSE: 112 mg/dL — AB (ref 65–99)
Glucose, Bld: 92 mg/dL (ref 65–99)
Potassium: 3.6 mmol/L (ref 3.5–5.1)
Potassium: 4 mmol/L (ref 3.5–5.1)
Sodium: 136 mmol/L (ref 135–145)
Sodium: 136 mmol/L (ref 135–145)

## 2017-04-23 MED ORDER — MORPHINE SULFATE (PF) 4 MG/ML IV SOLN
4.0000 mg | Freq: Once | INTRAVENOUS | Status: AC
  Administered 2017-04-23: 4 mg via INTRAVENOUS
  Filled 2017-04-23: qty 1

## 2017-04-23 MED ORDER — CLINDAMYCIN PHOSPHATE 600 MG/50ML IV SOLN
600.0000 mg | Freq: Once | INTRAVENOUS | Status: AC
  Administered 2017-04-23: 600 mg via INTRAVENOUS
  Filled 2017-04-23: qty 50

## 2017-04-23 MED ORDER — HYDROCODONE-ACETAMINOPHEN 7.5-325 MG/15ML PO SOLN: 10.0000 mL | Freq: Four times a day (QID) | ORAL | 0 refills | Status: AC | PRN

## 2017-04-23 MED ORDER — DIAZEPAM 5 MG/ML IJ SOLN
5.0000 mg | Freq: Once | INTRAMUSCULAR | Status: AC
  Administered 2017-04-23: 5 mg via INTRAVENOUS
  Filled 2017-04-23: qty 2

## 2017-04-23 MED ORDER — CLINDAMYCIN HCL 150 MG PO CAPS
300.0000 mg | ORAL_CAPSULE | Freq: Once | ORAL | Status: AC
  Administered 2017-04-23: 300 mg via ORAL
  Filled 2017-04-23: qty 2

## 2017-04-23 MED ORDER — DEXAMETHASONE SODIUM PHOSPHATE 10 MG/ML IJ SOLN
10.0000 mg | Freq: Once | INTRAMUSCULAR | Status: AC
  Administered 2017-04-23: 10 mg via INTRAVENOUS
  Filled 2017-04-23: qty 1

## 2017-04-23 MED ORDER — HYDROMORPHONE HCL 1 MG/ML IJ SOLN
1.0000 mg | Freq: Once | INTRAMUSCULAR | Status: AC
  Administered 2017-04-23: 1 mg via INTRAVENOUS
  Filled 2017-04-23: qty 1

## 2017-04-23 MED ORDER — KETOROLAC TROMETHAMINE 15 MG/ML IJ SOLN
15.0000 mg | Freq: Once | INTRAMUSCULAR | Status: AC
  Administered 2017-04-23: 15 mg via INTRAVENOUS
  Filled 2017-04-23: qty 1

## 2017-04-23 MED ORDER — CLINDAMYCIN HCL 300 MG PO CAPS: 600.0000 mg | ORAL_CAPSULE | Freq: Three times a day (TID) | ORAL | 0 refills | Status: DC

## 2017-04-23 MED ORDER — IOPAMIDOL (ISOVUE-300) INJECTION 61%
80.0000 mL | Freq: Once | INTRAVENOUS | Status: AC | PRN
  Administered 2017-04-23: 80 mL via INTRAVENOUS

## 2017-04-23 MED FILL — CLINDAMYCIN HCL 300 MG CAPS: 300 | 7 days supply | Qty: 21 | Fill #0

## 2017-04-23 MED FILL — HYDROCOD-APAP 7.5-325/15ML: 7.5-325 | 3 days supply | Qty: 120 | Fill #0

## 2017-04-23 NOTE — Discharge Instructions (Signed)
Return to the ED with any concerns including increased swelling, difficulty breathing or swallowing, vomiting and not able to keep down liquids, fever/chills, decreased level of alertness/lethargy, or any other alarming symptoms

## 2017-04-23 NOTE — ED Notes (Signed)
Pt directed to pharmacy to pick up Rx. Pt has family at bedside to drive. Pt and family Asencion Gowda(Aunt Cheryl) verbalize understanding to f/u with Dr. Chales Salmonwsley today at 1230 and to call office this am to confirm time they should arrive at his office. Pt ambulatory to d/c window without need for assist.

## 2017-04-23 NOTE — ED Provider Notes (Signed)
[facility]-EMERGENCY DEPT Provider Note   CSN: [id number] Arrival date & time: 04/22/17  2309     History   Chief Complaint Chief Complaint  Patient presents with  . Facial Swelling    HPI Render Carlos Guzman is a 31 y.o. male.  HPI  Pt with hx of HIV, Hepatitis last CD4 was 750 comes in with cc of facial swelling. Pt reports that he started having some tooth ache few days back, but over the past 2 days he has had L sided facial swelling and worsening pain. Pt denies n/v/f/c. Pt does indicate that he has trouble opening his mouth and unable to eat or chew food the last 2 days. Pt denies sore throat.  Past Medical History:  Diagnosis Date  . Asthma   . HIV (human immunodeficiency virus infection) (HCC)     There are no active problems to display for this patient.   History reviewed. No pertinent surgical history.     Home Medications    Prior to Admission medications   Medication Sig Start Date End Date Taking? Authorizing Provider  clindamycin (CLEOCIN) 300 MG capsule Take 2 capsules (600 mg total) by mouth 3 (three) times daily. 04/23/17   Derwood Kaplan, MD  HYDROcodone-acetaminophen (HYCET) 7.5-325 mg/15 ml solution Take 10 mLs by mouth 4 (four) times daily as needed for moderate pain. 04/23/17 04/23/18  Azalia Bilis, MD    Family History No family history on file.  Social History Social History  Substance Use Topics  . Smoking status: Current Every Day Smoker    Packs/day: 1.00    Types: Cigarettes  . Smokeless tobacco: Never Used  . Alcohol use Yes     Allergies   Patient has no known allergies.   Review of Systems Review of Systems  Constitutional: Positive for activity change.  HENT: Positive for dental problem and facial swelling. Negative for drooling, rhinorrhea and trouble swallowing.   Respiratory: Negative for shortness of breath.   Cardiovascular: Negative for chest pain.  Allergic/Immunologic: Positive for immunocompromised state.  All other  systems reviewed and are negative.    Physical Exam Updated Vital Signs BP 135/82   Pulse 60   Temp 98.6 F (37 C) (Oral)   Resp 16   Ht 6\' 1"  (1.854 m)   Wt 83.9 kg (185 lb)   SpO2 100%   BMI 24.41 kg/m   Physical Exam  Constitutional: He is oriented to person, place, and time. He appears well-developed.  HENT:  Head: Atraumatic.  L sided facial swelling and pt has toothache in the LUQ and LLQ. There is no gingival swelling  Neck: Neck supple.  Cardiovascular: Normal rate.   Pulmonary/Chest: Effort normal. No stridor.  Lymphadenopathy:    He has no cervical adenopathy.  Neurological: He is alert and oriented to person, place, and time.  Skin: Skin is warm.  Nursing note and vitals reviewed.    ED Treatments / Results  Labs (all labs ordered are listed, but only abnormal results are displayed) Labs Reviewed  CBC WITH DIFFERENTIAL/PLATELET - Abnormal; Notable for the following:       Result Value   WBC 10.7 (*)    Hemoglobin 12.9 (*)    HCT 38.0 (*)    All other components within normal limits  BASIC METABOLIC PANEL - Abnormal; Notable for the following:    Creatinine, Ser 1.32 (*)    All other components within normal limits    EKG  EKG Interpretation None  Radiology Ct Soft Tissue Neck W Contrast  Result Date: 04/23/2017 CLINICAL DATA:  LEFT dental pain and facial swelling for several days, difficulty chewing. History of HIV. EXAM: CT NECK WITH CONTRAST TECHNIQUE: Multidetector CT imaging of the neck was performed using the standard protocol following the bolus administration of intravenous contrast. CONTRAST:  80mL ISOVUE-300 IOPAMIDOL (ISOVUE-300) INJECTION 61% COMPARISON:  None. FINDINGS: PHARYNX AND LARYNX: Normal.  Widely patent airway. SALIVARY GLANDS: Normal. THYROID: Normal. LYMPH NODES: Multilevel mild lymphadenopathy with reniform morphology, likely reactive. VASCULAR: Normal. LIMITED INTRACRANIAL: Normal. VISUALIZED ORBITS: Normal. MASTOIDS  AND VISUALIZED PARANASAL SINUSES: Well-aerated. SKELETON: Tooth 16 dental carie, dehiscent periapical lucency/abscess without oral antral fistula. Adjacent gingival 3 x 10 mm abscess. UPPER CHEST: Lung apices are clear. No superior mediastinal lymphadenopathy. OTHER: None. IMPRESSION: 1. Tooth 16 dental carie, periapical abscess and adjacent 3 x 10 mm gingival abscess. 2. Mild cervical lymphadenopathy is likely reactive, may be related to patient's immunocompromised state. Electronically Signed   By: Awilda Metroourtnay  Bloomer M.D.   On: 04/23/2017 02:12    Procedures Procedures (including critical care time)  Medications Ordered in ED Medications  clindamycin (CLEOCIN) IVPB 600 mg (0 mg Intravenous Stopped 04/23/17 0136)  ketorolac (TORADOL) 15 MG/ML injection 15 mg (15 mg Intravenous Given 04/23/17 0059)  iopamidol (ISOVUE-300) 61 % injection 80 mL (80 mLs Intravenous Contrast Given 04/23/17 0113)  diazepam (VALIUM) injection 5 mg (5 mg Intravenous Given 04/23/17 0746)  HYDROmorphone (DILAUDID) injection 1 mg (1 mg Intravenous Given 04/23/17 0746)  clindamycin (CLEOCIN) capsule 300 mg (300 mg Oral Given 04/23/17 0849)     Initial Impression / Assessment and Plan / ED Course  I have reviewed the triage vital signs and the nursing notes.  Pertinent labs & imaging results that were available during my care of the patient were reviewed by me and considered in my medical decision making (see chart for details).  Clinical Course as of Apr 23 2301  Wed Apr 23, 2017  0802 Exam after anesthesia reveals no clear abscess right over the gingiva, instead there is abscess on the buccal side, and it is not big enough for us to drain. Dr. Chales Salmonwsley to see the patient at 12:30 today.  [AN]    Clinical Course User Index [AN] Derwood KaplanNanavati, Natalia Wittmeyer, MD    Pt with HIV and hepatitis comes in with cc of facial swelling. DDX: Dental abscess Dental infection Parotitis  CT ordered. Pt has trismus - which is concerning but  there is no stridor or crepitus over the neck. Pt has no tonsillar enlargement and exudates.   Final Clinical Impressions(s) / ED Diagnoses   Final diagnoses:  Dental abscess    New Prescriptions Discharge Medication List as of 04/23/2017  8:31 AM    START taking these medications   Details  clindamycin (CLEOCIN) 300 MG capsule Take 2 capsules (600 mg total) by mouth 3 (three) times daily., Starting Wed 04/23/2017, Print    HYDROcodone-acetaminophen (HYCET) 7.5-325 mg/15 ml solution Take 10 mLs by mouth 4 (four) times daily as needed for moderate pain., Starting Wed 04/23/2017, Until Thu 04/23/2018, Print         Derwood KaplanNanavati, Lymon Kidney, MD 04/23/17 [phone]

## 2017-04-23 NOTE — ED Provider Notes (Signed)
[facility]-EMERGENCY DEPT Provider Note   CSN: [id number] Arrival date & time: 04/23/17  1626     History   Chief Complaint Chief Complaint  Patient presents with  . Dental Pain    HPI Lourdes Sledgeaul Topel is a 31 y.o. male.  HPI  Pt with hx of asthma, HIV presenting with worsening left facial pain and swelling.  He was seen in the ED last night, saw oral surgeon today and had left upper posterior molar pulled in the setting of periapical and gingival abscess.  Pt was sedated for the procedure.  He states after the procedure the swelling and pain worsened.  No fever/chills.  He states it is difficult to swallow due to the pain.  No difficulty breathing.  There are no other associated systemic symptoms, there are no other alleviating or modifying factors.   Past Medical History:  Diagnosis Date  . Asthma   . HIV (human immunodeficiency virus infection) (HCC)     There are no active problems to display for this patient.   History reviewed. No pertinent surgical history.     Home Medications    Prior to Admission medications   Medication Sig Start Date End Date Taking? Authorizing Provider  clindamycin (CLEOCIN) 300 MG capsule Take 2 capsules (600 mg total) by mouth 3 (three) times daily. 04/23/17   Derwood KaplanNanavati, Ankit, MD  HYDROcodone-acetaminophen (HYCET) 7.5-325 mg/15 ml solution Take 10 mLs by mouth 4 (four) times daily as needed for moderate pain. 04/23/17 04/23/18  Azalia Bilisampos, Kevin, MD    Family History No family history on file.  Social History Social History  Substance Use Topics  . Smoking status: Current Every Day Smoker    Packs/day: 1.00    Types: Cigarettes  . Smokeless tobacco: Never Used  . Alcohol use Yes     Allergies   Patient has no known allergies.   Review of Systems Review of Systems  ROS reviewed and all otherwise negative except for mentioned in HPI   Physical Exam Updated Vital Signs BP (!) 143/83 (BP Location: Right Arm)   Pulse 74   Temp 98.9 F  (37.2 C) (Oral)   Resp 16   SpO2 97%  Vitals reviewed Physical Exam  Physical Examination: General appearance - alert, well appearing, and in no distress Mental status - alert, oriented to person, place, and time Eyes - no conjunctival injection, no scleral icterus Mouth - mucous membranes moist, pharynx normal without lesions, left sided facial swelling, left upper posterior molar extracted, no significant bleeding, no lip or tongue swelling, OP clear Neck - supple, no significant adenopathy Chest - clear to auscultation, no wheezes, rales or rhonchi, symmetric air entry, no stridor Heart - normal rate, regular rhythm, normal S1, S2, no murmurs, rubs, clicks or gallops Neurological - alert, oriented, normal speech, Extremities - peripheral pulses normal, no pedal edema, no clubbing or cyanosis Skin - normal coloration and turgor, no rashes   ED Treatments / Results  Labs (all labs ordered are listed, but only abnormal results are displayed) Labs Reviewed  CBC - Abnormal; Notable for the following:       Result Value   WBC 14.0 (*)    All other components within normal limits  BASIC METABOLIC PANEL - Abnormal; Notable for the following:    Glucose, Bld 112 (*)    Creatinine, Ser 1.35 (*)    All other components within normal limits    EKG  EKG Interpretation None       Radiology  Ct Soft Tissue Neck W Contrast  Result Date: 04/23/2017 CLINICAL DATA:  LEFT dental pain and facial swelling for several days, difficulty chewing. History of HIV. EXAM: CT NECK WITH CONTRAST TECHNIQUE: Multidetector CT imaging of the neck was performed using the standard protocol following the bolus administration of intravenous contrast. CONTRAST:  80mL ISOVUE-300 IOPAMIDOL (ISOVUE-300) INJECTION 61% COMPARISON:  None. FINDINGS: PHARYNX AND LARYNX: Normal.  Widely patent airway. SALIVARY GLANDS: Normal. THYROID: Normal. LYMPH NODES: Multilevel mild lymphadenopathy with reniform morphology, likely  reactive. VASCULAR: Normal. LIMITED INTRACRANIAL: Normal. VISUALIZED ORBITS: Normal. MASTOIDS AND VISUALIZED PARANASAL SINUSES: Well-aerated. SKELETON: Tooth 16 dental carie, dehiscent periapical lucency/abscess without oral antral fistula. Adjacent gingival 3 x 10 mm abscess. UPPER CHEST: Lung apices are clear. No superior mediastinal lymphadenopathy. OTHER: None. IMPRESSION: 1. Tooth 16 dental carie, periapical abscess and adjacent 3 x 10 mm gingival abscess. 2. Mild cervical lymphadenopathy is likely reactive, may be related to patient's immunocompromised state. Electronically Signed   By: Awilda Metro M.D.   On: 04/23/2017 02:12    Procedures Procedures (including critical care time)  Medications Ordered in ED Medications  clindamycin (CLEOCIN) IVPB 600 mg (0 mg Intravenous Stopped 04/23/17 1804)  dexamethasone (DECADRON) injection 10 mg (10 mg Intravenous Given 04/23/17 1811)  morphine 4 MG/ML injection 4 mg (4 mg Intravenous Given 04/23/17 1811)     Initial Impression / Assessment and Plan / ED Course  I have reviewed the triage vital signs and the nursing notes.  Pertinent labs & imaging results that were available during my care of the patient were reviewed by me and considered in my medical decision making (see chart for details).    6:38 PM pt was able to drink liquids without difficulty in the ED.  CT scan was reviewed from last night and showed periapical abscess and gingival abscess of left upper molar.  Pt has no stridor or difficulty breathing.  WBC is increased but he will continue antibiotics orally as he can tolerate fluids by mouth.  Discharged with strict return precautions.  Pt agreeable with plan.   Final Clinical Impressions(s) / ED Diagnoses   Final diagnoses:  Dental abscess    New Prescriptions Discharge Medication List as of 04/23/2017  6:42 PM       Chrislynn Mosely, Latanya Maudlin, MD 04/24/17 1821

## 2017-04-23 NOTE — ED Notes (Signed)
ED Provider at bedside. 

## 2017-04-23 NOTE — ED Notes (Signed)
Given sprite for fluid challenge  

## 2017-04-23 NOTE — Discharge Instructions (Signed)
We saw you in the ER for the toothache.  YOU WILL NEED TO SEE Dr. Chales Salmonwsley today at 12:30 pm for optimal care. Please take the antibiotics.  Return to the ER if there is drooling, difficulty breathing, difficulty opening mouth, fevers, chills, confusion, headaches, seizures.

## 2017-04-23 NOTE — ED Triage Notes (Signed)
Pt was seen here yesterday for dental abscess-saw dentist today and had tooth extracted-increase in pain-steady gait-grimacing in pain

## 2017-04-23 NOTE — ED Notes (Signed)
ED Provider at bedside discussing dispo plan of care. 

## 2017-10-11 ENCOUNTER — Other Ambulatory Visit: Payer: Self-pay

## 2017-10-11 ENCOUNTER — Emergency Department (HOSPITAL_COMMUNITY): Payer: Self-pay

## 2017-10-11 ENCOUNTER — Encounter (HOSPITAL_COMMUNITY): Payer: Self-pay | Admitting: Emergency Medicine

## 2017-10-11 ENCOUNTER — Emergency Department (HOSPITAL_COMMUNITY)
Admission: EM | Admit: 2017-10-11 | Discharge: 2017-10-11 | Disposition: A | Discharge status: Home / Self Care | Payer: Self-pay | Attending: Emergency Medicine | Admitting: Emergency Medicine

## 2017-10-11 DIAGNOSIS — J45909 Unspecified asthma, uncomplicated: Secondary | ICD-10-CM | POA: Insufficient documentation

## 2017-10-11 DIAGNOSIS — K2920 Alcoholic gastritis without bleeding: Secondary | ICD-10-CM | POA: Insufficient documentation

## 2017-10-11 DIAGNOSIS — Z79899 Other long term (current) drug therapy: Secondary | ICD-10-CM | POA: Insufficient documentation

## 2017-10-11 DIAGNOSIS — F1410 Cocaine abuse, uncomplicated: Secondary | ICD-10-CM | POA: Insufficient documentation

## 2017-10-11 DIAGNOSIS — F17210 Nicotine dependence, cigarettes, uncomplicated: Secondary | ICD-10-CM | POA: Insufficient documentation

## 2017-10-11 DIAGNOSIS — F1010 Alcohol abuse, uncomplicated: Secondary | ICD-10-CM | POA: Insufficient documentation

## 2017-10-11 LAB — BASIC METABOLIC PANEL
ANION GAP: 13 (ref 5–15)
BUN: 11 mg/dL (ref 6–20)
CHLORIDE: 104 mmol/L (ref 101–111)
CO2: 21 mmol/L — AB (ref 22–32)
Calcium: 9.1 mg/dL (ref 8.9–10.3)
Creatinine, Ser: 1.24 mg/dL (ref 0.61–1.24)
GFR calc Af Amer: >60 mL/min (ref >60)
GFR calc non Af Amer: >60 mL/min (ref >60)
GLUCOSE: 109 mg/dL — AB (ref 65–99)
POTASSIUM: 3.5 mmol/L (ref 3.5–5.1)
Sodium: 138 mmol/L (ref 135–145)

## 2017-10-11 LAB — CBC
HEMATOCRIT: 43.5 % (ref 39.0–52.0)
HEMOGLOBIN: 14.6 g/dL (ref 13.0–17.0)
MCH: 30.8 pg (ref 26.0–34.0)
MCHC: 33.6 g/dL (ref 30.0–36.0)
MCV: 91.8 fL (ref 78.0–100.0)
Platelets: 229 10*3/uL (ref 150–400)
RBC: 4.74 MIL/uL (ref 4.22–5.81)
RDW: 13.6 % (ref 11.5–15.5)
WBC: 9.4 10*3/uL (ref 4.0–10.5)

## 2017-10-11 LAB — I-STAT TROPONIN, ED
Troponin i, poc: 0.01 ng/mL (ref 0.00–0.08)
Troponin i, poc: 0 ng/mL (ref 0.00–0.08)

## 2017-10-11 LAB — RAPID URINE DRUG SCREEN, HOSP PERFORMED
Amphetamines: NOT DETECTED
BENZODIAZEPINES: NOT DETECTED
Barbiturates: NOT DETECTED
COCAINE: POSITIVE — AB
Opiates: NOT DETECTED
TETRAHYDROCANNABINOL: POSITIVE — AB

## 2017-10-11 LAB — ETHANOL: Alcohol, Ethyl (B): 105 mg/dL — ABNORMAL HIGH (ref <10)

## 2017-10-11 MED ORDER — GI COCKTAIL ~~LOC~~
30.0000 mL | Freq: Once | ORAL | Status: AC
  Administered 2017-10-11: 30 mL via ORAL
  Filled 2017-10-11: qty 30

## 2017-10-11 MED ORDER — ONDANSETRON HCL 4 MG/2ML IJ SOLN
4.0000 mg | Freq: Once | INTRAMUSCULAR | Status: AC
  Administered 2017-10-11: 4 mg via INTRAVENOUS
  Filled 2017-10-11: qty 2

## 2017-10-11 MED ORDER — SODIUM CHLORIDE 0.9 % IV BOLUS (SEPSIS)
1000.0000 mL | Freq: Once | INTRAVENOUS | Status: AC
Start: 2017-10-11 — End: 2017-10-11
  Administered 2017-10-11: 1000 mL via INTRAVENOUS

## 2017-10-11 MED ORDER — FAMOTIDINE 20 MG PO TABS: 20.0000 mg | ORAL_TABLET | Freq: Two times a day (BID) | ORAL | 0 refills | Status: DC

## 2017-10-11 MED ORDER — LORAZEPAM 2 MG/ML IJ SOLN
2.0000 mg | Freq: Once | INTRAMUSCULAR | Status: AC
Start: 2017-10-11 — End: 2017-10-11
  Administered 2017-10-11: 2 mg via INTRAVENOUS
  Filled 2017-10-11: qty 1

## 2017-10-11 MED ORDER — LORAZEPAM 2 MG/ML IJ SOLN: 1.0000 mg | Freq: Once | INTRAMUSCULAR | Status: DC

## 2017-10-11 MED ORDER — CHLORDIAZEPOXIDE HCL 25 MG PO CAPS: ORAL_CAPSULE | ORAL | 0 refills | Status: DC

## 2017-10-11 NOTE — ED Notes (Signed)
ED Provider at bedside. 

## 2017-10-11 NOTE — ED Triage Notes (Signed)
Pt arrives via [facility] c/o  Central CP, denies radiation, describes as "heavy," with epigastric pain.  Pt reports ETOH and cocaine use last night. EMS reports giving 324 ASA and 1 NTG with some relief.  EMS reports 1 episode emesis en route.  Pt sleeping at this time. resp e/u.

## 2017-10-11 NOTE — ED Notes (Signed)
Gave patient the urinal to get the urine for patient test patient is resting with call bell in reach

## 2017-10-11 NOTE — ED Provider Notes (Signed)
[facility] Provider Note   CSN: [id number] Arrival date & time: 10/11/17  0755     History   Chief Complaint Chief Complaint  Patient presents with  . Chest Pain    HPI Lourdes Sledgeaul Calico is a 32 y.o. male.  The history is provided by the patient. No language interpreter was used.  Chest Pain   This is a new problem. The current episode started 6 to 12 hours ago. The problem occurs constantly. The problem has been gradually improving. The pain is associated with coughing (vomiting). The pain is present in the substernal region. The pain is moderate. The pain radiates to the epigastrium. Associated symptoms include nausea. He has tried nothing for the symptoms. The treatment provided no relief. Risk factors include alcohol intake, substance abuse and male gender.  Pt reports he drinks every day.  Pt reports he used cocaine last pm.  Pt complains of vomiting today.  Pt complains of pain in epigastric area. Pt reports he has a history of reflux and gastritis from drinking.    Past Medical History:  Diagnosis Date  . Asthma   . HIV (human immunodeficiency virus infection) (HCC)     There are no active problems to display for this patient.   History reviewed. No pertinent surgical history.     Home Medications    Prior to Admission medications   Medication Sig Start Date End Date Taking? Authorizing Provider  clindamycin (CLEOCIN) 300 MG capsule Take 2 capsules (600 mg total) by mouth 3 (three) times daily. 04/23/17   Derwood KaplanNanavati, Ankit, MD  HYDROcodone-acetaminophen (HYCET) 7.5-325 mg/15 ml solution Take 10 mLs by mouth 4 (four) times daily as needed for moderate pain. 04/23/17 04/23/18  Azalia Bilisampos, Kevin, MD    Family History No family history on file.  Social History Social History   Tobacco Use  . Smoking status: Current Every Day Smoker    Packs/day: 1.00    Types: Cigarettes  . Smokeless tobacco: Never Used  Substance Use Topics  .  Alcohol use: Yes  . Drug use: Yes    Types: Cocaine    Comment: pt reports use on 10/10/17     Allergies   Patient has no known allergies.   Review of Systems Review of Systems  Cardiovascular: Positive for chest pain.  Gastrointestinal: Positive for nausea.     Physical Exam Updated Vital Signs BP (!) 142/89 (BP Location: Right Arm)   Pulse 60   Temp 98.6 F (37 C) (Oral)   Resp 18   SpO2 99%   Physical Exam  Constitutional: He is oriented to person, place, and time. He appears well-developed and well-nourished.  HENT:  Head: Normocephalic.  Eyes: Pupils are equal, round, and reactive to light.  Neck: Normal range of motion.  Cardiovascular: Normal rate, regular rhythm and normal pulses.  Pulmonary/Chest: Effort normal and breath sounds normal.  Musculoskeletal:       Right lower leg: Normal.       Left lower leg: Normal.  Neurological: He is alert and oriented to person, place, and time.  Skin: Skin is warm.  Psychiatric: He has a normal mood and affect.  Nursing note and vitals reviewed.    ED Treatments / Results  Labs (all labs ordered are listed, but only abnormal results are displayed) Labs Reviewed  BASIC METABOLIC PANEL - Abnormal; Notable for the following components:      Result Value   CO2 21 (*)    Glucose,  Bld 109 (*)    All other components within normal limits  ETHANOL - Abnormal; Notable for the following components:   Alcohol, Ethyl (B) 105 (*)    All other components within normal limits  RAPID URINE DRUG SCREEN, HOSP PERFORMED - Abnormal; Notable for the following components:   Cocaine POSITIVE (*)    Tetrahydrocannabinol POSITIVE (*)    All other components within normal limits  CBC  I-STAT TROPONIN, ED  I-STAT TROPONIN, ED    EKG  EKG Interpretation  Date/Time:  Saturday October 11 2017 07:56:52 EST Ventricular Rate:  62 PR Interval:    QRS Duration: 109 QT Interval:  442 QTC Calculation: 449 R Axis:   85 Text  Interpretation:  Sinus arrhythmia ECG OTHERWISE WITHIN NORMAL LIMITS No old tracing to compare Confirmed by Eber Hong ([id number]) on 10/11/2017 8:03:24 AM       Radiology Dg Chest 2 View  Result Date: 10/11/2017 CLINICAL DATA:  Chest pain. EXAM: CHEST  2 VIEW COMPARISON:  July 27, 2014 FINDINGS: The cardiac silhouette is borderline and mildly more prominent since 2015. The hila and mediastinum are normal. No pneumothorax. No pulmonary nodules, masses, or focal infiltrates. IMPRESSION: The cardiac silhouette is borderline and mildly more prominent since 2015. No other abnormalities or changes. Electronically Signed   By: Gerome Sam III M.D   On: 10/11/2017 08:43    Procedures Procedures (including critical care time)  Medications Ordered in ED Medications - No data to display   Initial Impression / Assessment and Plan / ED Course  I have reviewed the triage vital signs and the nursing notes.  Pertinent labs & imaging results that were available during my care of the patient were reviewed by me and considered in my medical decision making (see chart for details).     ED course.  Pt given Iv fluids and zofran.  He reports feeling better.  Pt has normal EKG.  While he is waiting for second troponin. He ambulated and reported increased pain.  Pt became nauseated, vomited and developed shaking.    Pt given ativan 2mg  Iv.  All symptoms resolved.  Pt reports feeling better.  Troponin is negative x 2.    Pt reports he does not feel he needs detox.  He is not interested alcohol treatment program.  I will give him rx for librium and pepcid.      Final Clinical Impressions(s) / ED Diagnoses   Final diagnoses:  Acute alcoholic gastritis without hemorrhage  Cocaine abuse (HCC)  Alcohol abuse    ED Discharge Orders    None    An After Visit Summary was printed and given to the patient.    Elson Areas, PA-C 10/11/17 1459    Eber Hong, MD 10/12/17 [phone]

## 2017-10-11 NOTE — ED Provider Notes (Signed)
Is a 32 year old male, states that he drinks heavily every day including a pint of liquor without difficulty, states that he was drinking most of the night, was also using cocaine which she states was the first time ever, he now reports that he is having epigastric pain with multiple episodes of nausea and vomiting.  He denies chest pain above the epigastrium, denies back pain, denies shortness of breath, swelling of the legs or any other problems.  He reports that he feels better after medication given by paramedics though he still has his epigastric pain his nausea is less.  He denies any prior cardiac history, prior exertional history, denies injuries, denies fevers or significant amount of coughing.  He reports this is my "alcoholic gastritis"  On exam the patient has clear heart and lung sounds without murmurs, soft nontender abdomen except for minimal tenderness in the epigastrium, no edema, he is sleepy but easily arousable.  Follows commands without any difficulties.   EKG Interpretation  Date/Time:  Saturday October 11 2017 07:56:52 EST Ventricular Rate:  62 PR Interval:    QRS Duration: 109 QT Interval:  442 QTC Calculation: 449 R Axis:   85 Text Interpretation:  Sinus arrhythmia ECG OTHERWISE WITHIN NORMAL LIMITS No old tracing to compare Confirmed by Eber HongMiller, Aleiya Rye ([id number]) on 10/11/2017 8:03:24 AM Also confirmed by Eber HongMiller, Lacresia Darwish ([id number]), editor Barbette Hairassel, Kerry [phone]([id number])  on 10/11/2017 8:40:38 AM       Medical screening examination/treatment/procedure(s) were conducted as a shared visit with non-physician practitioner(s) and myself.  I personally evaluated the patient during the encounter.  Clinical Impression:   Final diagnoses:  Acute alcoholic gastritis without hemorrhage  Cocaine abuse (HCC)  Alcohol abuse         Eber HongMiller, Marlean Mortell, MD 10/12/17 [phone]

## 2017-10-11 NOTE — Discharge Instructions (Signed)
Return if any problems.

## 2017-10-11 NOTE — ED Notes (Signed)
Pt calling out frequently for pain medication. PA made aware. Pt stating the room is cold, adjusted thermostat in both surrounding rooms to help. Offered warm blanket.

## 2019-06-02 ENCOUNTER — Other Ambulatory Visit: Payer: Self-pay

## 2019-06-02 ENCOUNTER — Encounter (HOSPITAL_COMMUNITY): Payer: Self-pay

## 2019-06-02 DIAGNOSIS — Z5321 Procedure and treatment not carried out due to patient leaving prior to being seen by health care provider: Secondary | ICD-10-CM | POA: Insufficient documentation

## 2019-06-02 MED ORDER — SODIUM CHLORIDE 0.9% FLUSH: 3.0000 mL | Freq: Once | INTRAVENOUS | Status: DC

## 2019-06-02 NOTE — ED Triage Notes (Signed)
Pt reports generalized abdominal pain and vomiting x3 days. Denies fever or chest pain. Reports intermittent SOB with nausea episodes. A&Ox4. Denies any sick contacts.

## 2019-06-03 ENCOUNTER — Emergency Department (HOSPITAL_COMMUNITY)
Admission: EM | Admit: 2019-06-03 | Discharge: 2019-06-03 | Discharge status: Against Medical Advice | Payer: Self-pay | Attending: Emergency Medicine | Admitting: Emergency Medicine

## 2019-06-08 ENCOUNTER — Emergency Department (HOSPITAL_BASED_OUTPATIENT_CLINIC_OR_DEPARTMENT_OTHER)
Admission: EM | Admit: 2019-06-08 | Discharge: 2019-06-08 | Disposition: A | Discharge status: Home / Self Care | Payer: Self-pay | Attending: Emergency Medicine | Admitting: Emergency Medicine

## 2019-06-08 ENCOUNTER — Encounter (HOSPITAL_BASED_OUTPATIENT_CLINIC_OR_DEPARTMENT_OTHER): Payer: Self-pay | Admitting: Emergency Medicine

## 2019-06-08 ENCOUNTER — Other Ambulatory Visit: Payer: Self-pay

## 2019-06-08 DIAGNOSIS — F1110 Opioid abuse, uncomplicated: Secondary | ICD-10-CM | POA: Insufficient documentation

## 2019-06-08 DIAGNOSIS — R1013 Epigastric pain: Secondary | ICD-10-CM | POA: Insufficient documentation

## 2019-06-08 DIAGNOSIS — F1210 Cannabis abuse, uncomplicated: Secondary | ICD-10-CM | POA: Insufficient documentation

## 2019-06-08 DIAGNOSIS — Z79899 Other long term (current) drug therapy: Secondary | ICD-10-CM | POA: Insufficient documentation

## 2019-06-08 DIAGNOSIS — F17210 Nicotine dependence, cigarettes, uncomplicated: Secondary | ICD-10-CM | POA: Insufficient documentation

## 2019-06-08 DIAGNOSIS — F1410 Cocaine abuse, uncomplicated: Secondary | ICD-10-CM | POA: Insufficient documentation

## 2019-06-08 DIAGNOSIS — R112 Nausea with vomiting, unspecified: Secondary | ICD-10-CM | POA: Insufficient documentation

## 2019-06-08 DIAGNOSIS — J45909 Unspecified asthma, uncomplicated: Secondary | ICD-10-CM | POA: Insufficient documentation

## 2019-06-08 DIAGNOSIS — B20 Human immunodeficiency virus [HIV] disease: Secondary | ICD-10-CM | POA: Insufficient documentation

## 2019-06-08 LAB — URINALYSIS, ROUTINE W REFLEX MICROSCOPIC
Bilirubin Urine: NEGATIVE
Glucose, UA: NEGATIVE mg/dL
Ketones, ur: 15 mg/dL — AB
Leukocytes,Ua: NEGATIVE
Nitrite: NEGATIVE
Protein, ur: NEGATIVE mg/dL
Specific Gravity, Urine: 1.02 (ref 1.005–1.030)
pH: 8 (ref 5.0–8.0)

## 2019-06-08 LAB — CBC WITH DIFFERENTIAL/PLATELET
Abs Immature Granulocytes: 0.09 10*3/uL — ABNORMAL HIGH (ref 0.00–0.07)
Basophils Absolute: 0.1 10*3/uL (ref 0.0–0.1)
Basophils Relative: 0 %
Eosinophils Absolute: 0 10*3/uL (ref 0.0–0.5)
Eosinophils Relative: 0 %
HCT: 50.7 % (ref 39.0–52.0)
Hemoglobin: 16.7 g/dL (ref 13.0–17.0)
Immature Granulocytes: 0 %
Lymphocytes Relative: 9 %
Lymphs Abs: 1.8 10*3/uL (ref 0.7–4.0)
MCH: 30.3 pg (ref 26.0–34.0)
MCHC: 32.9 g/dL (ref 30.0–36.0)
MCV: 91.8 fL (ref 80.0–100.0)
Monocytes Absolute: 0.6 10*3/uL (ref 0.1–1.0)
Monocytes Relative: 3 %
Neutro Abs: 18 10*3/uL — ABNORMAL HIGH (ref 1.7–7.7)
Neutrophils Relative %: 88 %
Platelets: 283 10*3/uL (ref 150–400)
RBC: 5.52 MIL/uL (ref 4.22–5.81)
RDW: 12.7 % (ref 11.5–15.5)
WBC: 20.6 10*3/uL — ABNORMAL HIGH (ref 4.0–10.5)
nRBC: 0 % (ref 0.0–0.2)

## 2019-06-08 LAB — RAPID URINE DRUG SCREEN, HOSP PERFORMED
Amphetamines: NOT DETECTED
Barbiturates: NOT DETECTED
Benzodiazepines: NOT DETECTED
Cocaine: NOT DETECTED
Opiates: POSITIVE — AB
Tetrahydrocannabinol: POSITIVE — AB

## 2019-06-08 LAB — COMPREHENSIVE METABOLIC PANEL
ALT: 17 U/L (ref 0–44)
AST: 23 U/L (ref 15–41)
Albumin: 4.3 g/dL (ref 3.5–5.0)
Alkaline Phosphatase: 56 U/L (ref 38–126)
Anion gap: 13 (ref 5–15)
BUN: 10 mg/dL (ref 6–20)
CO2: 20 mmol/L — ABNORMAL LOW (ref 22–32)
Calcium: 9.6 mg/dL (ref 8.9–10.3)
Chloride: 106 mmol/L (ref 98–111)
Creatinine, Ser: 1.17 mg/dL (ref 0.61–1.24)
GFR calc Af Amer: >60 mL/min (ref >60)
GFR calc non Af Amer: >60 mL/min (ref >60)
Glucose, Bld: 144 mg/dL — ABNORMAL HIGH (ref 70–99)
Potassium: 3.5 mmol/L (ref 3.5–5.1)
Sodium: 139 mmol/L (ref 135–145)
Total Bilirubin: 0.7 mg/dL (ref 0.3–1.2)
Total Protein: 8.7 g/dL — ABNORMAL HIGH (ref 6.5–8.1)

## 2019-06-08 LAB — URINALYSIS, MICROSCOPIC (REFLEX): Squamous Epithelial / LPF: NONE SEEN (ref 0–5)

## 2019-06-08 LAB — ETHANOL: Alcohol, Ethyl (B): <10 mg/dL (ref <10)

## 2019-06-08 LAB — LIPASE, BLOOD: Lipase: 24 U/L (ref 11–51)

## 2019-06-08 MED ORDER — HALOPERIDOL LACTATE 5 MG/ML IJ SOLN
5.0000 mg | Freq: Once | INTRAMUSCULAR | Status: AC
  Administered 2019-06-08: 13:00:00 5 mg via INTRAVENOUS
  Filled 2019-06-08: qty 1

## 2019-06-08 MED ORDER — SODIUM CHLORIDE 0.9 % IV BOLUS
1000.0000 mL | Freq: Once | INTRAVENOUS | Status: AC
  Administered 2019-06-08: 1000 mL via INTRAVENOUS

## 2019-06-08 MED ORDER — LIDOCAINE VISCOUS HCL 2 % MT SOLN
15.0000 mL | Freq: Once | OROMUCOSAL | Status: AC
  Administered 2019-06-08: 15 mL via ORAL
  Filled 2019-06-08: qty 15

## 2019-06-08 MED ORDER — MORPHINE SULFATE (PF) 4 MG/ML IV SOLN
4.0000 mg | Freq: Once | INTRAVENOUS | Status: AC
  Administered 2019-06-08: 4 mg via INTRAVENOUS
  Filled 2019-06-08: qty 1

## 2019-06-08 MED ORDER — MORPHINE SULFATE (PF) 4 MG/ML IV SOLN
4.0000 mg | Freq: Once | INTRAVENOUS | Status: AC
Start: 2019-06-08 — End: 2019-06-08
  Administered 2019-06-08: 13:00:00 4 mg via INTRAVENOUS
  Filled 2019-06-08: qty 1

## 2019-06-08 MED ORDER — ALUM & MAG HYDROXIDE-SIMETH 200-200-20 MG/5ML PO SUSP
30.0000 mL | Freq: Once | ORAL | Status: AC
  Administered 2019-06-08: 13:00:00 30 mL via ORAL
  Filled 2019-06-08: qty 30

## 2019-06-08 MED ORDER — ONDANSETRON HCL 4 MG/2ML IJ SOLN
4.0000 mg | Freq: Once | INTRAMUSCULAR | Status: AC
  Administered 2019-06-08: 11:00:00 4 mg via INTRAVENOUS
  Filled 2019-06-08: qty 2

## 2019-06-08 MED ORDER — PANTOPRAZOLE SODIUM 20 MG PO TBEC: 20.0000 mg | DELAYED_RELEASE_TABLET | Freq: Every day | ORAL | 0 refills | Status: DC

## 2019-06-08 NOTE — Discharge Instructions (Signed)
Your history, exam, and labs today are consistent with nausea and vomiting due to alcohol-related gastritis.  As she did not fill or take the prescription of Protonix, we are giving another prescription.  Please use your home nausea medicine and maintain hydration.  Please avoid alcohol.  After our shared decision made conversation, we agreed to hold on repeat imaging and since you are feeling better, we feel you are safe for discharge home.  Your white blood cell count was elevated likely due to all of the vomiting you are doing.  Please follow-up with a GI doctor and a PCP for further management.  If any symptoms change or worsen, please return to the nearest emergency department.

## 2019-06-08 NOTE — ED Triage Notes (Signed)
Pt having abdominal pain with N/V x one week.

## 2019-06-08 NOTE — ED Notes (Signed)
Pt unable to provide urine sample at this time 

## 2019-06-08 NOTE — ED Notes (Signed)
abd pain w n/v x 4 days  diaphoretic

## 2019-06-08 NOTE — ED Provider Notes (Signed)
[facility] EMERGENCY DEPARTMENT Provider Note   CSN: [id number] Arrival date & time: 06/08/19  1016     History   Chief Complaint Chief Complaint  Patient presents with  . Abdominal Pain    HPI Wolf Boulay is a 33 y.o. male.     The history is provided by the patient, a significant other and medical records.  Abdominal Pain Pain location:  Generalized Pain quality: aching and cramping   Pain radiates to:  Does not radiate Pain severity:  Severe Onset quality:  Gradual Duration:  1 day Timing:  Constant Progression:  Worsening Chronicity:  Recurrent Context: alcohol use   Context: not previous surgeries   Relieved by:  Nothing Worsened by:  Nothing Ineffective treatments:  None tried Associated symptoms: nausea and vomiting   Associated symptoms: no chest pain, no chills, no constipation, no cough, no diarrhea, no dysuria, no fatigue, no fever, no flatus and no shortness of breath   Risk factors: alcohol abuse     Past Medical History:  Diagnosis Date  . Asthma   . HIV (human immunodeficiency virus infection) ([place])     There are no active problems to display for this patient.   History reviewed. No pertinent surgical history.      Home Medications    Prior to Admission medications   Medication Sig Start Date End Date Taking? Authorizing Provider  abacavir-dolutegravir-lamiVUDine (TRIUMEQ) 600-50-300 MG tablet Take 1 tablet by mouth daily. 10/02/17   [provider]  chlordiazePOXIDE (LIBRIUM) 25 MG capsule 50mg  PO TID x 1D, then 25-50mg  PO BID X 1D, then 25-50mg  PO QD X 1D 10/11/17   Fransico Meadow, PA-C  clindamycin (CLEOCIN) 300 MG capsule Take 2 capsules (600 mg total) by mouth 3 (three) times daily. Patient not taking: Reported on 10/11/2017 04/23/17   Varney Biles, MD  famotidine (PEPCID) 20 MG tablet Take 1 tablet (20 mg total) by mouth 2 (two) times daily. 10/11/17   Fransico Meadow, PA-C    Family History History reviewed. No  pertinent family history.  Social History Social History   Tobacco Use  . Smoking status: Current Every Day Smoker    Packs/day: 1.00    Types: Cigarettes  . Smokeless tobacco: Never Used  Substance Use Topics  . Alcohol use: Yes  . Drug use: Yes    Types: Cocaine, Marijuana    Comment: pt reports use on 10/10/17     Allergies   Patient has no known allergies.   Review of Systems Review of Systems  Constitutional: Positive for diaphoresis. Negative for chills, fatigue and fever.  HENT: Negative for congestion.   Eyes: Negative for visual disturbance.  Respiratory: Negative for cough, chest tightness, shortness of breath, wheezing and stridor.   Cardiovascular: Negative for chest pain, palpitations and leg swelling.  Gastrointestinal: Positive for abdominal pain, nausea and vomiting. Negative for blood in stool, constipation, diarrhea and flatus.  Genitourinary: Negative for dysuria, flank pain and frequency.  Musculoskeletal: Negative for back pain, neck pain and neck stiffness.  Skin: Negative for rash and wound.  Neurological: Negative for light-headedness and headaches.  Psychiatric/Behavioral: Negative for agitation.  All other systems reviewed and are negative.    Physical Exam Updated Vital Signs BP (!) 150/108   Pulse 66   Temp (!) 96.9 F (36.1 C) (Axillary)   Resp (!) 22   Ht 6\' 1"  (1.854 m)   Wt 79.4 kg   SpO2 100%   BMI 23.09 kg/m  Physical Exam Vitals signs and nursing note reviewed.  Constitutional:      General: He is not in acute distress.    Appearance: He is well-developed. He is not ill-appearing, toxic-appearing or diaphoretic.  HENT:     Head: Normocephalic and atraumatic.     Mouth/Throat:     Mouth: Mucous membranes are moist.     Pharynx: No pharyngeal swelling.  Eyes:     Extraocular Movements: Extraocular movements intact.     Conjunctiva/sclera: Conjunctivae normal.  Neck:     Musculoskeletal: Neck supple.  Cardiovascular:      Rate and Rhythm: Normal rate and regular rhythm.     Heart sounds: Normal heart sounds. No murmur.  Pulmonary:     Effort: Pulmonary effort is normal. No respiratory distress.     Breath sounds: Normal breath sounds. No wheezing, rhonchi or rales.  Chest:     Chest wall: No tenderness.  Abdominal:     General: Abdomen is flat. Bowel sounds are normal. There is no distension.     Palpations: Abdomen is soft.     Tenderness: There is generalized abdominal tenderness. There is no right CVA tenderness or left CVA tenderness.  Skin:    General: Skin is warm and dry.     Capillary Refill: Capillary refill takes less than 2 seconds.  Neurological:     General: No focal deficit present.     Mental Status: He is alert.  Psychiatric:        Mood and Affect: Mood normal.      ED Treatments / Results  Labs (all labs ordered are listed, but only abnormal results are displayed) Labs Reviewed  CBC WITH DIFFERENTIAL/PLATELET - Abnormal; Notable for the following components:      Result Value   WBC 20.6 (*)    Neutro Abs 18.0 (*)    Abs Immature Granulocytes 0.09 (*)    All other components within normal limits  COMPREHENSIVE METABOLIC PANEL - Abnormal; Notable for the following components:   CO2 20 (*)    Glucose, Bld 144 (*)    Total Protein 8.7 (*)    All other components within normal limits  URINALYSIS, ROUTINE W REFLEX MICROSCOPIC - Abnormal; Notable for the following components:   Hgb urine dipstick TRACE (*)    Ketones, ur 15 (*)    All other components within normal limits  RAPID URINE DRUG SCREEN, HOSP PERFORMED - Abnormal; Notable for the following components:   Opiates POSITIVE (*)    Tetrahydrocannabinol POSITIVE (*)    All other components within normal limits  URINALYSIS, MICROSCOPIC (REFLEX) - Abnormal; Notable for the following components:   Bacteria, UA MANY (*)    All other components within normal limits  LIPASE, BLOOD  ETHANOL    EKG None  Radiology No  results found.  Procedures Procedures (including critical care time)  Medications Ordered in ED Medications  sodium chloride 0.9 % bolus 1,000 mL (0 mLs Intravenous Stopped 06/08/19 1227)  ondansetron (ZOFRAN) injection 4 mg (4 mg Intravenous Given 06/08/19 1118)  alum & mag hydroxide-simeth (MAALOX/MYLANTA) 200-200-20 MG/5ML suspension 30 mL (30 mLs Oral Given 06/08/19 1231)    And  lidocaine (XYLOCAINE) 2 % viscous mouth solution 15 mL (15 mLs Oral Given 06/08/19 1231)  morphine 4 MG/ML injection 4 mg (4 mg Intravenous Given 06/08/19 1122)  haloperidol lactate (HALDOL) injection 5 mg (5 mg Intravenous Given 06/08/19 1230)  sodium chloride 0.9 % bolus 1,000 mL (0 mLs Intravenous Stopped 06/08/19  1517)  morphine 4 MG/ML injection 4 mg (4 mg Intravenous Given 06/08/19 1231)     Initial Impression / Assessment and Plan / ED Course  I have reviewed the triage vital signs and the nursing notes.  Pertinent labs & imaging results that were available during my care of the patient were reviewed by me and considered in my medical decision making (see chart for details).        Casper Pagliuca is a 33 y.o. male with a past medical history significant for asthma, HIV, prior pancreatitis, and prior visits over the last week for abdominal pain diagnosed with alcoholic gastritis who presents with continued abdominal pain, nausea, vomiting.  Patient is coming by family who reports that last week patient went 3 times to emergency department to other facilities for similar abdominal pain nausea, and vomiting.  Patient symptoms are all related to after drinking alcohol.  He had CT imaging that was reassuring.  He reports that he had done well for several days but last night drank alcohol again.  Today he is having the same nausea vomiting, and abdominal pain as before.  He has not been taking the Zofran or the Protonix according to family.  He describes the pain is severe and across his abdomen.  Denies any testicle  pains.  Denies any constipation or diarrhea.  Denies any fevers, chills, chest pain, shortness of breath.  No recent injuries.  On exam, abdomen is tender to palpation.  Lungs are clear and chest is nontender.  Patient is not tachycardic on exam.  Good pulses in all extremities.  Patient is actively vomiting in the room.  Clinically I suspect recurrent and re-aggravation of alcoholic gastritis versus pancreatitis.  Had a shared decision-making conversation with patient and family and we agreed to hold on CT imaging or other imaging at this time as symptoms are the same as they were last week with reassuring imaging.  They would rather focus on rehydration and getting his symptoms taken care of.  We will give further pain medicine, nausea medicine, and fluids.  Anticipate reassessment after labs.  Patient's labs showed elevated leukocytosis but were otherwise reassuring.  Suspect this is due to the nausea, vomiting, and demargination.  Lower suspicion for infection at this time.  Patient was offered imaging given a leukocytosis but he does not want to pursue this.  After medications, patient felt much better and was able to tolerate p.o.  Patient now wants to go home.  We agreed to do no imaging at this time and patient was given a new prescription for the Protonix which was prescribed before.  He reports he will fill this and still has nausea medicine at home.  He wants to go home.  Patient will follow-up with a gastroenterologist and PCP.  He understands return precautions.  He will avoid alcohol.  He had no questions or concerns and was discharged in good condition with improved symptoms.   Final Clinical Impressions(s) / ED Diagnoses   Final diagnoses:  Epigastric pain  Non-intractable vomiting with nausea, unspecified vomiting type    ED Discharge Orders         Ordered    pantoprazole (PROTONIX) 20 MG tablet  Daily     06/08/19 1446          Clinical Impression: 1. Epigastric  pain   2. Non-intractable vomiting with nausea, unspecified vomiting type     Disposition: Discharge  Condition: Good  I have discussed the results, Dx and  Tx plan with the pt(& family if present). He/she/they expressed understanding and agree(s) with the plan. Discharge instructions discussed at great length. Strict return precautions discussed and pt &/or family have verbalized understanding of the instructions. No further questions at time of discharge.    New Prescriptions   PANTOPRAZOLE (PROTONIX) 20 MG TABLET    Take 1 tablet (20 mg total) by mouth daily.    Follow Up: [facility] [address]  [place] [id number] [phone]       Maanav Kassabian, Canary Brimhristopher J, MD 06/08/19 [phone]

## 2019-06-10 ENCOUNTER — Encounter (HOSPITAL_BASED_OUTPATIENT_CLINIC_OR_DEPARTMENT_OTHER): Payer: Self-pay | Admitting: *Deleted

## 2019-06-10 ENCOUNTER — Other Ambulatory Visit: Payer: Self-pay

## 2019-06-10 ENCOUNTER — Emergency Department (HOSPITAL_BASED_OUTPATIENT_CLINIC_OR_DEPARTMENT_OTHER)
Admission: EM | Admit: 2019-06-10 | Discharge: 2019-06-10 | Disposition: A | Discharge status: Home / Self Care | Payer: Self-pay | Attending: Emergency Medicine | Admitting: Emergency Medicine

## 2019-06-10 DIAGNOSIS — R1013 Epigastric pain: Secondary | ICD-10-CM | POA: Insufficient documentation

## 2019-06-10 DIAGNOSIS — F17210 Nicotine dependence, cigarettes, uncomplicated: Secondary | ICD-10-CM | POA: Insufficient documentation

## 2019-06-10 DIAGNOSIS — B20 Human immunodeficiency virus [HIV] disease: Secondary | ICD-10-CM | POA: Insufficient documentation

## 2019-06-10 DIAGNOSIS — Z79899 Other long term (current) drug therapy: Secondary | ICD-10-CM | POA: Insufficient documentation

## 2019-06-10 HISTORY — DX: Acute pancreatitis without necrosis or infection, unspecified: K85.90

## 2019-06-10 LAB — CBC WITH DIFFERENTIAL/PLATELET
Abs Immature Granulocytes: 0.1 10*3/uL — ABNORMAL HIGH (ref 0.00–0.07)
Basophils Absolute: 0.1 10*3/uL (ref 0.0–0.1)
Basophils Relative: 0 %
Eosinophils Absolute: 0.1 10*3/uL (ref 0.0–0.5)
Eosinophils Relative: 1 %
HCT: 49.3 % (ref 39.0–52.0)
Hemoglobin: 16.1 g/dL (ref 13.0–17.0)
Immature Granulocytes: 1 %
Lymphocytes Relative: 17 %
Lymphs Abs: 3 10*3/uL (ref 0.7–4.0)
MCH: 30.3 pg (ref 26.0–34.0)
MCHC: 32.7 g/dL (ref 30.0–36.0)
MCV: 92.7 fL (ref 80.0–100.0)
Monocytes Absolute: 0.8 10*3/uL (ref 0.1–1.0)
Monocytes Relative: 5 %
Neutro Abs: 13.6 10*3/uL — ABNORMAL HIGH (ref 1.7–7.7)
Neutrophils Relative %: 76 %
Platelets: 284 10*3/uL (ref 150–400)
RBC: 5.32 MIL/uL (ref 4.22–5.81)
RDW: 12.9 % (ref 11.5–15.5)
WBC: 17.7 10*3/uL — ABNORMAL HIGH (ref 4.0–10.5)
nRBC: 0 % (ref 0.0–0.2)

## 2019-06-10 LAB — COMPREHENSIVE METABOLIC PANEL
ALT: 17 U/L (ref 0–44)
AST: 29 U/L (ref 15–41)
Albumin: 3.6 g/dL (ref 3.5–5.0)
Alkaline Phosphatase: 48 U/L (ref 38–126)
Anion gap: 10 (ref 5–15)
BUN: 8 mg/dL (ref 6–20)
CO2: 24 mmol/L (ref 22–32)
Calcium: 8.9 mg/dL (ref 8.9–10.3)
Chloride: 105 mmol/L (ref 98–111)
Creatinine, Ser: 1.2 mg/dL (ref 0.61–1.24)
GFR calc Af Amer: >60 mL/min (ref >60)
GFR calc non Af Amer: >60 mL/min (ref >60)
Glucose, Bld: 114 mg/dL — ABNORMAL HIGH (ref 70–99)
Potassium: 3.8 mmol/L (ref 3.5–5.1)
Sodium: 139 mmol/L (ref 135–145)
Total Bilirubin: 0.9 mg/dL (ref 0.3–1.2)
Total Protein: 7.3 g/dL (ref 6.5–8.1)

## 2019-06-10 LAB — LIPASE, BLOOD: Lipase: 36 U/L (ref 11–51)

## 2019-06-10 LAB — ETHANOL: Alcohol, Ethyl (B): <10 mg/dL (ref <10)

## 2019-06-10 MED ORDER — PANTOPRAZOLE SODIUM 40 MG IV SOLR
40.0000 mg | Freq: Once | INTRAVENOUS | Status: AC
  Administered 2019-06-10: 40 mg via INTRAVENOUS
  Filled 2019-06-10: qty 40

## 2019-06-10 MED ORDER — SODIUM CHLORIDE 0.9 % IV BOLUS
1000.0000 mL | Freq: Once | INTRAVENOUS | Status: AC
  Administered 2019-06-10: 1000 mL via INTRAVENOUS

## 2019-06-10 MED ORDER — HALOPERIDOL LACTATE 5 MG/ML IJ SOLN
5.0000 mg | Freq: Once | INTRAMUSCULAR | Status: AC
  Administered 2019-06-10: 5 mg via INTRAVENOUS
  Filled 2019-06-10: qty 1

## 2019-06-10 MED ORDER — METOCLOPRAMIDE HCL 10 MG PO TABS: 10.0000 mg | ORAL_TABLET | Freq: Four times a day (QID) | ORAL | 0 refills | Status: DC | PRN

## 2019-06-10 MED ORDER — ONDANSETRON HCL 4 MG/2ML IJ SOLN
4.0000 mg | Freq: Once | INTRAMUSCULAR | Status: AC
  Administered 2019-06-10: 4 mg via INTRAVENOUS
  Filled 2019-06-10: qty 2

## 2019-06-10 MED ORDER — SUCRALFATE 1 GM/10ML PO SUSP
1.0000 g | Freq: Once | ORAL | Status: AC
  Administered 2019-06-10: 1 g via ORAL
  Filled 2019-06-10: qty 10

## 2019-06-10 MED ORDER — FENTANYL CITRATE (PF) 100 MCG/2ML IJ SOLN
100.0000 ug | Freq: Once | INTRAMUSCULAR | Status: AC
  Administered 2019-06-10: 100 ug via INTRAVENOUS
  Filled 2019-06-10: qty 2

## 2019-06-10 MED ORDER — SUCRALFATE 1 G PO TABS: 1.0000 g | ORAL_TABLET | Freq: Three times a day (TID) | ORAL | 0 refills | Status: DC

## 2019-06-10 NOTE — ED Notes (Signed)
ED Provider went in to assess pt  Pt resting quietly with eyes closed  No acute distress noted

## 2019-06-10 NOTE — ED Notes (Signed)
ED Provider at bedside. 

## 2019-06-10 NOTE — ED Notes (Signed)
Pt states did not consume alcohol yesterday.

## 2019-06-10 NOTE — ED Notes (Signed)
Dr Long in to see pt.

## 2019-06-10 NOTE — ED Triage Notes (Signed)
Pt c/o diffuse abd pain x 2 hrs after eating , seen here yesterday for same

## 2019-06-10 NOTE — ED Notes (Signed)
Patient verbalizes understanding of discharge instructions. Opportunity for questioning and answers were provided. Armband removed by staff, pt discharged from ED.  

## 2019-06-10 NOTE — ED Provider Notes (Signed)
[facility] Provider Note: Georgena Spurling, MD, FACEP  CSN: [id number] MRN: [id number] ARRIVAL: 06/10/19 at [place]: [place]  Abdominal Pain   HISTORY OF PRESENT ILLNESS  06/10/19 1:34 AM Carlos Guzman is a 33 y.o. male on [place] for HIV infection.  He was seen 2 days for epigastric pain and discharged on pantoprazole which he did not get filled.  Differential diagnosis at that time included alcoholic gastritis and pancreatitis he returns with a return of his epigastric pain after eating yesterday.  He rates the pain as a 10 out of 10.  Localizes it to the epigastrium, and states it feels like previous episodes of pancreatitis.  He has had associated nausea and vomiting.  He has not been able to have a bowel movement.  His abdomen is not distended.  He was seen at [facility] for this on the 21st and a CT scan was unremarkable except for hepatic steatosis.  He has a history of alcohol, cocaine and marijuana abuse.  His most recent drug screen was positive for cannabis.   Past Medical History:  Diagnosis Date  . Asthma   . HIV (human immunodeficiency virus infection) ([place])   . Pancreatitis     History reviewed. No pertinent surgical history.  History reviewed. No pertinent family history.  Social History   Tobacco Use  . Smoking status: Current Every Day Smoker    Packs/day: 1.00    Types: Cigarettes  . Smokeless tobacco: Never Used  Substance Use Topics  . Alcohol use: Yes  . Drug use: Yes    Types: Cocaine, Marijuana    Comment: pt reports use on 10/10/17    Prior to Admission medications   Medication Sig Start Date End Date Taking? Authorizing Provider  abacavir-dolutegravir-lamiVUDine (TRIUMEQ) 600-50-300 MG tablet Take 1 tablet by mouth daily. 10/02/17   [provider]  metoCLOPramide (REGLAN) 10 MG tablet Take 1 tablet (10 mg total) by mouth every 6 (six) hours as needed for nausea or vomiting. 06/10/19    Emmalise Huard, MD  pantoprazole (PROTONIX) 20 MG tablet Take 1 tablet (20 mg total) by mouth daily. 06/08/19   Tegeler, Gwenyth Allegra, MD  famotidine (PEPCID) 20 MG tablet Take 1 tablet (20 mg total) by mouth 2 (two) times daily. 10/11/17 06/10/19  Fransico Meadow, PA-C    Allergies Patient has no known allergies.   REVIEW OF SYSTEMS  Negative except as noted here or in the History of Present Illness.   PHYSICAL EXAMINATION  Initial Vital Signs Blood pressure (!) 159/118, pulse 73, temperature 98.1 F (36.7 C), resp. rate 20, height 6\' 1"  (1.854 m), weight 79 kg, SpO2 100 %.  Examination General: Well-developed, well-nourished male in no acute distress but appears uncomfortable; appearance consistent with age of record HENT: normocephalic; atraumatic Eyes: pupils equal, round and reactive to light; extraocular muscles intact Neck: supple Heart: regular rate and rhythm; no murmurs, rubs or gallops Lungs: clear to auscultation bilaterally Abdomen: soft; nondistended; epigastric tenderness; no masses or hepatosplenomegaly; bowel sounds present Extremities: No deformity; full range of motion; pulses normal Neurologic: Awake, alert and oriented; motor function intact in all extremities and symmetric; no facial droop Skin: Diaphoretic Psychiatric: Grimacing   RESULTS  Summary of this visit's results, reviewed by myself:   EKG Interpretation  Date/Time:    Ventricular Rate:    PR Interval:    QRS Duration:   QT Interval:    QTC Calculation:  R Axis:     Text Interpretation:        Laboratory Studies: Results for orders placed or performed during the hospital encounter of 06/10/19 (from the past 24 hour(s))  CBC with Differential/Platelet     Status: Abnormal   Collection Time: 06/10/19  2:09 AM  Result Value Ref Range   WBC 17.7 (H) 4.0 - 10.5 K/uL   RBC 5.32 4.22 - 5.81 MIL/uL   Hemoglobin 16.1 13.0 - 17.0 g/dL   HCT 40.949.3 81.139.0 - 91.452.0 %   MCV 92.7 80.0 - 100.0 fL    MCH 30.3 26.0 - 34.0 pg   MCHC 32.7 30.0 - 36.0 g/dL   RDW 78.212.9 95.611.5 - 21.315.5 %   Platelets 284 150 - 400 K/uL   nRBC 0.0 0.0 - 0.2 %   Neutrophils Relative % 76 %   Neutro Abs 13.6 (H) 1.7 - 7.7 K/uL   Lymphocytes Relative 17 %   Lymphs Abs 3.0 0.7 - 4.0 K/uL   Monocytes Relative 5 %   Monocytes Absolute 0.8 0.1 - 1.0 K/uL   Eosinophils Relative 1 %   Eosinophils Absolute 0.1 0.0 - 0.5 K/uL   Basophils Relative 0 %   Basophils Absolute 0.1 0.0 - 0.1 K/uL   Immature Granulocytes 1 %   Abs Immature Granulocytes 0.10 (H) 0.00 - 0.07 K/uL  Comprehensive metabolic panel     Status: Abnormal   Collection Time: 06/10/19  2:09 AM  Result Value Ref Range   Sodium 139 135 - 145 mmol/L   Potassium 3.8 3.5 - 5.1 mmol/L   Chloride 105 98 - 111 mmol/L   CO2 24 22 - 32 mmol/L   Glucose, Bld 114 (H) 70 - 99 mg/dL   BUN 8 6 - 20 mg/dL   Creatinine, Ser 0.861.20 0.61 - 1.24 mg/dL   Calcium 8.9 8.9 - 57.810.3 mg/dL   Total Protein 7.3 6.5 - 8.1 g/dL   Albumin 3.6 3.5 - 5.0 g/dL   AST 29 15 - 41 U/L   ALT 17 0 - 44 U/L   Alkaline Phosphatase 48 38 - 126 U/L   Total Bilirubin 0.9 0.3 - 1.2 mg/dL   GFR calc non Af Amer >60 >60 mL/min   GFR calc Af Amer >60 >60 mL/min   Anion gap 10 5 - 15  Lipase, blood     Status: None   Collection Time: 06/10/19  2:09 AM  Result Value Ref Range   Lipase 36 11 - 51 U/L  Ethanol     Status: None   Collection Time: 06/10/19  2:09 AM  Result Value Ref Range   Alcohol, Ethyl (B) <10 <10 mg/dL   Imaging Studies: No results found.  ED COURSE and MDM  Nursing notes and initial vitals signs, including pulse oximetry, reviewed.  Vitals:   06/10/19 0051 06/10/19 0052 06/10/19 0155 06/10/19 0216  BP:  (!) 159/118 (!) 131/94 (!) 159/118  Pulse:  73 68 70  Resp:  20 20   Temp:  98.1 F (36.7 C)    SpO2:  100% 100%   Weight: 79 kg     Height: 6\' 1"  (1.854 m)      4:19 AM Sleeping comfortably after IV fluids and medications.  No evidence of acute pancreatitis on  lab work.  As noted above, CT scan was unremarkable on the 21st.   6:07 AM Patient sleeping but arousable.  Abdomen soft, minimally tender.  Patient states his pain is improved.  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  He was advised to fill his PPI and we will add Reglan.  PROCEDURES    ED DIAGNOSES     ICD-10-CM   1. Epigastric pain  R10.13        Paula Libra, MD 06/10/19 [phone]

## 2019-07-23 ENCOUNTER — Emergency Department (HOSPITAL_BASED_OUTPATIENT_CLINIC_OR_DEPARTMENT_OTHER): Payer: Self-pay

## 2019-07-23 ENCOUNTER — Observation Stay (HOSPITAL_BASED_OUTPATIENT_CLINIC_OR_DEPARTMENT_OTHER)
Admission: EM | Admit: 2019-07-23 | Discharge: 2019-07-24 | Disposition: A | Discharge status: Home / Self Care | Payer: Self-pay | Attending: Internal Medicine | Admitting: Internal Medicine

## 2019-07-23 ENCOUNTER — Other Ambulatory Visit: Payer: Self-pay

## 2019-07-23 ENCOUNTER — Encounter (HOSPITAL_BASED_OUTPATIENT_CLINIC_OR_DEPARTMENT_OTHER): Payer: Self-pay | Admitting: *Deleted

## 2019-07-23 DIAGNOSIS — J45909 Unspecified asthma, uncomplicated: Secondary | ICD-10-CM | POA: Insufficient documentation

## 2019-07-23 DIAGNOSIS — F1290 Cannabis use, unspecified, uncomplicated: Secondary | ICD-10-CM | POA: Diagnosis present

## 2019-07-23 DIAGNOSIS — R112 Nausea with vomiting, unspecified: Secondary | ICD-10-CM | POA: Diagnosis present

## 2019-07-23 DIAGNOSIS — R1110 Vomiting, unspecified: Secondary | ICD-10-CM | POA: Insufficient documentation

## 2019-07-23 DIAGNOSIS — N179 Acute kidney failure, unspecified: Secondary | ICD-10-CM | POA: Diagnosis present

## 2019-07-23 DIAGNOSIS — B20 Human immunodeficiency virus [HIV] disease: Secondary | ICD-10-CM | POA: Insufficient documentation

## 2019-07-23 DIAGNOSIS — Z21 Asymptomatic human immunodeficiency virus [HIV] infection status: Secondary | ICD-10-CM | POA: Diagnosis present

## 2019-07-23 DIAGNOSIS — F1210 Cannabis abuse, uncomplicated: Secondary | ICD-10-CM | POA: Insufficient documentation

## 2019-07-23 DIAGNOSIS — D72829 Elevated white blood cell count, unspecified: Secondary | ICD-10-CM | POA: Diagnosis present

## 2019-07-23 DIAGNOSIS — Z79899 Other long term (current) drug therapy: Secondary | ICD-10-CM | POA: Insufficient documentation

## 2019-07-23 DIAGNOSIS — F17210 Nicotine dependence, cigarettes, uncomplicated: Secondary | ICD-10-CM | POA: Insufficient documentation

## 2019-07-23 DIAGNOSIS — F1011 Alcohol abuse, in remission: Secondary | ICD-10-CM | POA: Diagnosis present

## 2019-07-23 DIAGNOSIS — E860 Dehydration: Principal | ICD-10-CM | POA: Insufficient documentation

## 2019-07-23 DIAGNOSIS — Z720 Tobacco use: Secondary | ICD-10-CM | POA: Diagnosis present

## 2019-07-23 DIAGNOSIS — E876 Hypokalemia: Secondary | ICD-10-CM | POA: Diagnosis present

## 2019-07-23 DIAGNOSIS — M6282 Rhabdomyolysis: Secondary | ICD-10-CM | POA: Diagnosis present

## 2019-07-23 DIAGNOSIS — Z20828 Contact with and (suspected) exposure to other viral communicable diseases: Secondary | ICD-10-CM | POA: Insufficient documentation

## 2019-07-23 DIAGNOSIS — R1010 Upper abdominal pain, unspecified: Secondary | ICD-10-CM | POA: Insufficient documentation

## 2019-07-23 LAB — COMPREHENSIVE METABOLIC PANEL
ALT: 20 U/L (ref 0–44)
AST: 33 U/L (ref 15–41)
Albumin: 5.2 g/dL — ABNORMAL HIGH (ref 3.5–5.0)
Alkaline Phosphatase: 67 U/L (ref 38–126)
Anion gap: 16 — ABNORMAL HIGH (ref 5–15)
BUN: 31 mg/dL — ABNORMAL HIGH (ref 6–20)
CO2: 20 mmol/L — ABNORMAL LOW (ref 22–32)
Calcium: 10.3 mg/dL (ref 8.9–10.3)
Chloride: 96 mmol/L — ABNORMAL LOW (ref 98–111)
Creatinine, Ser: 2.4 mg/dL — ABNORMAL HIGH (ref 0.61–1.24)
GFR calc Af Amer: 39 mL/min — ABNORMAL LOW (ref >60)
GFR calc non Af Amer: 34 mL/min — ABNORMAL LOW (ref >60)
Glucose, Bld: 131 mg/dL — ABNORMAL HIGH (ref 70–99)
Potassium: 3.4 mmol/L — ABNORMAL LOW (ref 3.5–5.1)
Sodium: 132 mmol/L — ABNORMAL LOW (ref 135–145)
Total Bilirubin: 1.1 mg/dL (ref 0.3–1.2)
Total Protein: 10.3 g/dL — ABNORMAL HIGH (ref 6.5–8.1)

## 2019-07-23 LAB — CBC WITH DIFFERENTIAL/PLATELET
Abs Immature Granulocytes: 0.12 10*3/uL — ABNORMAL HIGH (ref 0.00–0.07)
Basophils Absolute: 0.1 10*3/uL (ref 0.0–0.1)
Basophils Relative: 0 %
Eosinophils Absolute: 0 10*3/uL (ref 0.0–0.5)
Eosinophils Relative: 0 %
HCT: 54.5 % — ABNORMAL HIGH (ref 39.0–52.0)
Hemoglobin: 18.7 g/dL — ABNORMAL HIGH (ref 13.0–17.0)
Immature Granulocytes: 1 %
Lymphocytes Relative: 12 %
Lymphs Abs: 2.7 10*3/uL (ref 0.7–4.0)
MCH: 30.2 pg (ref 26.0–34.0)
MCHC: 34.3 g/dL (ref 30.0–36.0)
MCV: 87.9 fL (ref 80.0–100.0)
Monocytes Absolute: 1.2 10*3/uL — ABNORMAL HIGH (ref 0.1–1.0)
Monocytes Relative: 6 %
Neutro Abs: 18.2 10*3/uL — ABNORMAL HIGH (ref 1.7–7.7)
Neutrophils Relative %: 81 %
Platelets: 316 10*3/uL (ref 150–400)
RBC: 6.2 MIL/uL — ABNORMAL HIGH (ref 4.22–5.81)
RDW: 13.2 % (ref 11.5–15.5)
WBC: 22.4 10*3/uL — ABNORMAL HIGH (ref 4.0–10.5)
nRBC: 0 % (ref 0.0–0.2)

## 2019-07-23 LAB — URINALYSIS, MICROSCOPIC (REFLEX)

## 2019-07-23 LAB — URINALYSIS, ROUTINE W REFLEX MICROSCOPIC
Bilirubin Urine: NEGATIVE
Glucose, UA: NEGATIVE mg/dL
Ketones, ur: NEGATIVE mg/dL
Leukocytes,Ua: NEGATIVE
Nitrite: NEGATIVE
Protein, ur: 100 mg/dL — AB
Specific Gravity, Urine: 1.025 (ref 1.005–1.030)
pH: 6 (ref 5.0–8.0)

## 2019-07-23 LAB — RAPID URINE DRUG SCREEN, HOSP PERFORMED
Amphetamines: NOT DETECTED
Barbiturates: NOT DETECTED
Benzodiazepines: NOT DETECTED
Cocaine: NOT DETECTED
Opiates: NOT DETECTED
Tetrahydrocannabinol: POSITIVE — AB

## 2019-07-23 LAB — LIPASE, BLOOD: Lipase: 26 U/L (ref 11–51)

## 2019-07-23 MED ORDER — METOCLOPRAMIDE HCL 10 MG PO TABS: 10.0000 mg | ORAL_TABLET | Freq: Four times a day (QID) | ORAL | Status: DC | PRN

## 2019-07-23 MED ORDER — PANTOPRAZOLE SODIUM 40 MG IV SOLR
40.0000 mg | Freq: Once | INTRAVENOUS | Status: AC
  Administered 2019-07-23: 18:00:00 40 mg via INTRAVENOUS
  Filled 2019-07-23: qty 40

## 2019-07-23 MED ORDER — OXYCODONE HCL 5 MG PO TABS
5.0000 mg | ORAL_TABLET | ORAL | Status: DC | PRN
  Administered 2019-07-23 – 2019-07-24 (×3): 5 mg via ORAL
  Filled 2019-07-23 (×4): qty 1

## 2019-07-23 MED ORDER — SODIUM CHLORIDE 0.9 % IV SOLN
INTRAVENOUS | Status: DC
  Administered 2019-07-23: 23:00:00 via INTRAVENOUS

## 2019-07-23 MED ORDER — BICTEGRAVIR-EMTRICITAB-TENOFOV 50-200-25 MG PO TABS
1.0000 | ORAL_TABLET | Freq: Every day | ORAL | Status: DC
  Filled 2019-07-23: qty 1

## 2019-07-23 MED ORDER — SODIUM CHLORIDE 0.9 % IV BOLUS
1000.0000 mL | Freq: Once | INTRAVENOUS | Status: AC
  Administered 2019-07-23: 18:00:00 1000 mL via INTRAVENOUS

## 2019-07-23 MED ORDER — ONDANSETRON HCL 4 MG/2ML IJ SOLN: 4.0000 mg | Freq: Four times a day (QID) | INTRAMUSCULAR | Status: DC | PRN

## 2019-07-23 MED ORDER — SODIUM CHLORIDE 0.9 % IV SOLN
Freq: Once | INTRAVENOUS | Status: AC
  Administered 2019-07-23: 16:00:00 via INTRAVENOUS

## 2019-07-23 MED ORDER — SODIUM CHLORIDE 0.9 % IV BOLUS: 1000.0000 mL | Freq: Once | INTRAVENOUS | Status: DC

## 2019-07-23 MED ORDER — SODIUM CHLORIDE 0.9 % IV BOLUS
1000.0000 mL | Freq: Once | INTRAVENOUS | Status: AC
  Administered 2019-07-23: 19:00:00 1000 mL via INTRAVENOUS

## 2019-07-23 MED ORDER — ONDANSETRON HCL 4 MG/2ML IJ SOLN
4.0000 mg | Freq: Once | INTRAMUSCULAR | Status: AC
  Administered 2019-07-23: 4 mg via INTRAVENOUS
  Filled 2019-07-23: qty 2

## 2019-07-23 MED ORDER — POTASSIUM CHLORIDE 10 MEQ/100ML IV SOLN
10.0000 meq | INTRAVENOUS | Status: AC
  Administered 2019-07-23 – 2019-07-24 (×2): 10 meq via INTRAVENOUS
  Filled 2019-07-23 (×2): qty 100

## 2019-07-23 MED ORDER — HYDROMORPHONE HCL 1 MG/ML IJ SOLN
1.0000 mg | Freq: Once | INTRAMUSCULAR | Status: AC
  Administered 2019-07-23: 16:00:00 1 mg via INTRAVENOUS
  Filled 2019-07-23: qty 1

## 2019-07-23 MED ORDER — ONDANSETRON HCL 4 MG PO TABS
4.0000 mg | ORAL_TABLET | Freq: Four times a day (QID) | ORAL | Status: DC | PRN
  Administered 2019-07-24: 4 mg via ORAL
  Filled 2019-07-23 (×2): qty 1

## 2019-07-23 MED ORDER — METOCLOPRAMIDE HCL 5 MG/ML IJ SOLN
10.0000 mg | Freq: Four times a day (QID) | INTRAMUSCULAR | Status: DC | PRN
  Administered 2019-07-23 – 2019-07-24 (×2): 10 mg via INTRAVENOUS
  Filled 2019-07-23 (×2): qty 2

## 2019-07-23 NOTE — Plan of Care (Signed)
33 yo M w hx HIV with AKI cr 2.4 At baseline 1.2 Hx of gastritis N/V no Diarrhea  for days CT negative WBC 22 Hg 18 K 3.4 Utox pending Covid pending  .accepted as obs meds sur  Tahlia Deamer 7:05 PM

## 2019-07-23 NOTE — ED Provider Notes (Signed)
[facility] EMERGENCY DEPARTMENT Provider Note   CSN: [id number] Arrival date & time: 11/13161096045/20  1503     History   Chief Complaint Chief Complaint  Patient presents with   Emesis   Abdominal Pain    HPI Carlos Guzman is a 33 y.o. male past medical history of pancreatitis, HIV on [place], presenting to the emergency department with 3 days of upper abdominal pain with associated nausea and vomiting.  He states he has been unable to keep anything down over the last few days.  His pain does feel quite similar to prior episodes of abdominal pain with nausea and vomiting which he relates to episodes of gastritis versus pancreatitis.  He states he has not had alcohol since his last ED visit in October.  He states his symptoms began after he ate fried chicken a couple of days ago and at that has been worsening since.  Denies associated fever, diarrhea, constipation, urinary symptoms.  No medications tried for symptoms.     The history is provided by the patient and medical records.    Past Medical History:  Diagnosis Date   Asthma    HIV (human immunodeficiency virus infection) ([facility])    Pancreatitis     There are no active problems to display for this patient.   History reviewed. No pertinent surgical history.      Home Medications    Prior to Admission medications   Medication Sig Start Date End Date Taking? Authorizing Provider  bictegravir-emtricitabine-tenofovir AF (BIKTARVY) 50-200-25 MG TABS tablet Take by mouth. 04/03/19  Yes [provider]  abacavir-dolutegravir-lamiVUDine (TRIUMEQ) 600-50-300 MG tablet Take 1 tablet by mouth daily. 10/02/17   [provider]  metoCLOPramide (REGLAN) 10 MG tablet Take 1 tablet (10 mg total) by mouth every 6 (six) hours as needed for nausea or vomiting. 06/10/19   Molpus, John, MD  pantoprazole (PROTONIX) 20 MG tablet Take 1 tablet (20 mg total) by mouth daily. 06/08/19   Tegeler, Canary Brimhristopher J, MD    famotidine (PEPCID) 20 MG tablet Take 1 tablet (20 mg total) by mouth 2 (two) times daily. 10/11/17 06/10/19  Elson AreasSofia, Leslie K, PA-C  sucralfate (CARAFATE) 1 g tablet Take 1 tablet (1 g total) by mouth 4 (four) times daily -  with meals and at bedtime. 06/10/19 06/10/19  Molpus, John, MD    Family History No family history on file.  Social History Social History   Tobacco Use   Smoking status: Current Every Day Smoker    Packs/day: 1.00    Types: Cigarettes   Smokeless tobacco: Never Used  Substance Use Topics   Alcohol use: Yes   Drug use: Yes    Types: Cocaine, Marijuana    Comment: pt reports use on 10/10/17     Allergies   Patient has no known allergies.   Review of Systems Review of Systems  All other systems reviewed and are negative.    Physical Exam Updated Vital Signs BP (!) 153/96    Pulse 93    Temp 98.8 F (37.1 C) (Oral)    Resp 19    Ht 6\' 1"  (1.854 m)    Wt 79 kg    SpO2 99%    BMI 22.98 kg/m   Physical Exam Vitals signs and nursing note reviewed.  Constitutional:      Appearance: He is well-developed.     Comments: Patient appears very uncomfortable  HENT:     Head: Normocephalic and atraumatic.  Eyes:  Conjunctiva/sclera: Conjunctivae normal.  Cardiovascular:     Rate and Rhythm: Normal rate and regular rhythm.  Pulmonary:     Effort: Pulmonary effort is normal. No respiratory distress.     Breath sounds: Normal breath sounds.  Abdominal:     General: Abdomen is flat. Bowel sounds are normal.     Palpations: Abdomen is soft.     Comments: Generalized tenderness is present with guarding.  Skin:    General: Skin is warm.  Neurological:     Mental Status: He is alert.  Psychiatric:        Behavior: Behavior normal.      ED Treatments / Results  Labs (all labs ordered are listed, but only abnormal results are displayed) Labs Reviewed  CBC WITH DIFFERENTIAL/PLATELET - Abnormal; Notable for the following components:      Result Value    WBC 22.4 (*)    RBC 6.20 (*)    Hemoglobin 18.7 (*)    HCT 54.5 (*)    Neutro Abs 18.2 (*)    Monocytes Absolute 1.2 (*)    Abs Immature Granulocytes 0.12 (*)    All other components within normal limits  COMPREHENSIVE METABOLIC PANEL - Abnormal; Notable for the following components:   Sodium 132 (*)    Potassium 3.4 (*)    Chloride 96 (*)    CO2 20 (*)    Glucose, Bld 131 (*)    BUN 31 (*)    Creatinine, Ser 2.40 (*)    Total Protein 10.3 (*)    Albumin 5.2 (*)    GFR calc non Af Amer 34 (*)    GFR calc Af Amer 39 (*)    Anion gap 16 (*)    All other components within normal limits  SARS CORONAVIRUS 2 (TAT 6-24 HRS)  LIPASE, BLOOD    EKG None  Radiology Ct Abdomen Pelvis Wo Contrast  Result Date: 07/23/2019 CLINICAL DATA:  Abdominal pain and nausea and vomiting for 2 days. Elevated white blood cell count. History of renal failure and HIV positivity. EXAM: CT ABDOMEN AND PELVIS WITHOUT CONTRAST TECHNIQUE: Multidetector CT imaging of the abdomen and pelvis was performed following the standard protocol without IV contrast. COMPARISON:  CT scan 05/31/2019 FINDINGS: Lower chest: The lung bases are clear of acute process. No pleural effusion or pulmonary lesions. The heart is normal in size. No pericardial effusion. The distal esophagus and aorta are unremarkable. Hepatobiliary: No focal hepatic lesions are identified without contrast. The gallbladder appears normal. No common bile duct dilatation. Pancreas: No mass, inflammation or ductal dilatation. Spleen: Normal size.  No focal lesions. Adrenals/Urinary Tract: The adrenal glands and kidneys are grossly normal. No renal, ureteral or bladder calculi or mass is identified without contrast. Stomach/Bowel: The stomach, duodenum, small bowel and colon are grossly normal. No obvious acute inflammatory changes, mass lesions or obstructive findings. The terminal ileum is grossly normal. Do not see the appendix for certain but I do not see  any definite findings to suggest acute appendicitis. Vascular/Lymphatic: The aorta is normal in caliber. No atheroscerlotic calcifications. No mesenteric of retroperitoneal mass or adenopathy. Small scattered lymph nodes are noted. Reproductive: The prostate gland and seminal vesicles are unremarkable. Other: Right inguinal hernia containing fluid is noted. Musculoskeletal: No significant bony findings. IMPRESSION: 1. No acute abdominal/pelvic findings, mass lesions or adenopathy identified without oral or IV contrast. 2. Right inguinal hernia containing fluid. Electronically Signed   By: Rudie Meyer M.D.   On: 07/23/2019 17:31  Procedures Procedures (including critical care time)  Medications Ordered in ED Medications  sodium chloride 0.9 % bolus 1,000 mL (has no administration in time range)  pantoprazole (PROTONIX) injection 40 mg (has no administration in time range)  0.9 %  sodium chloride infusion ( Intravenous Stopped 07/23/19 1732)  ondansetron (ZOFRAN) injection 4 mg (4 mg Intravenous Given 07/23/19 1608)  HYDROmorphone (DILAUDID) injection 1 mg (1 mg Intravenous Given 07/23/19 1610)  sodium chloride 0.9 % bolus 1,000 mL (1,000 mLs Intravenous New Bag/Given 07/23/19 1813)     Initial Impression / Assessment and Plan / ED Course  I have reviewed the triage vital signs and the nursing notes.  Pertinent labs & imaging results that were available during my care of the patient were reviewed by me and considered in my medical decision making (see chart for details).        Patient with history of GERD and pancreatitis presenting with 2 to 3 days of upper abdominal pain, nausea and vomiting.  Patient does have a history of similar episodes, however worse this week.  Afebrile with stable vital signs.  Does not meet sepsis criteria.  On exam, he appears very uncomfortable, has diffuse tenderness with guarding.  Labs, IV fluids, pain medication and antiemetics administered.  Lab work  reveals significant AKI, with creatinine up to 2.4 from 1.2 last month.  BUN is also elevated at 31 with a bicarb of 20.  Blood counts consistent with dehydration with elevated hemoglobin of 18.7.  He also has a significant leukocytosis of 22.4.  Lipase is within normal limits.  CT scan is negative for acute surgical cause of patient's symptoms.  Suspect symptoms may be secondary to gastritis flare though with patient significant AKI and dehydration will require admission for IV hydration.  Hospitalist consulted for admission to [facility].  Patient discussed with Dr. Rogene Houston, who agrees with work-up and care plan for admission.  The patient appears reasonably stabilized for admission considering the current resources, flow, and capabilities available in the ED at this time, and I doubt any other [facility] requiring further screening and/or treatment in the ED prior to admission.   Final Clinical Impressions(s) / ED Diagnoses   Final diagnoses:  Upper abdominal pain  Dehydration  AKI (acute kidney injury) [facility])    ED Discharge Orders    None       Kashlyn Salinas, [place] N, PA-C 07/23/19 Nicholaus Bloom, MD 07/31/19 [phone]

## 2019-07-23 NOTE — H&P (Signed)
Carlos Guzman ZOX:[id number] DOB: 05-22-1986 DOA: 07/23/2019     PCP: Patient, No Pcp Per   Outpatient Specialists:   ID at [facility]  Patient arrived to ER on 07/23/19 at 1503  Patient coming from: home Lives   With family    Chief Complaint:   Chief Complaint  Patient presents with   Emesis   Abdominal Pain    HPI: Carlos Guzman is a 33 y.o. male with medical history significant of marijuana abuse,  HIV  EtOH abuse hx of tobacco abuse  Presented with    abdominal pain nausea and vomiting ports he is feeling somewhat chilly.  Denies any Covid exposure.  Denies any fevers no chest pain or shortness of breath patient states his abdomen is hurting he does not wish to talk to me in details.  Reports he used to drink EtOh but quit 3 wks Reports Milks tobacco occasionally but has been smoking marijuana on a regular basis although states he has been cutting down a bit  States he is followed by ID at [facility] for his HIV States that he is unsure when was the last time his CD4 count was checked  Of note per review of records patient has longstanding history of elevated white blood cell count going back to September 2020  Had similar admission to [facility] in September 2020 also presented with abdominal pain nausea and vomiting That time CT scan abdomen also was unremarkable Infectious risk factors:  Reports  N/V    In  ER RAPID COVID TEST  in house testing  Pending  No results found for: SARSCOV2NAA   Regarding pertinent Chronic problems:    HIV - followed by [facility]    While in ER: CT abd WNL Evidence of AKI The following Work up has been ordered so far:  Orders Placed This Encounter  Procedures   SARS CORONAVIRUS 2 (TAT 6-24 HRS) Nasopharyngeal Nasopharyngeal Swab   CT Abdomen Pelvis Wo Contrast   CBC with Differential   Comprehensive metabolic panel   Lipase, blood   Urine rapid drug screen (hosp performed)not at [facility]   Urinalysis,  Routine w reflex microscopic   Urinalysis, Microscopic (reflex)   Consult to hospitalist  ALL PATIENTS BEING ADMITTED/HAVING PROCEDURES NEED COVID-19 SCREENING   EKG   EKG 12-Lead   Place in observation (patient's expected length of stay will be less than 2 midnights)    Following Medications were ordered in ER: Medications  sodium chloride 0.9 % bolus 1,000 mL (1,000 mLs Intravenous Not Given 07/23/19 1915)  0.9 %  sodium chloride infusion ( Intravenous Stopped 07/23/19 1732)  ondansetron (ZOFRAN) injection 4 mg (4 mg Intravenous Given 07/23/19 1608)  HYDROmorphone (DILAUDID) injection 1 mg (1 mg Intravenous Given 07/23/19 1610)  sodium chloride 0.9 % bolus 1,000 mL (0 mLs Intravenous Stopped 07/23/19 1916)  pantoprazole (PROTONIX) injection 40 mg (40 mg Intravenous Given 07/23/19 1825)  sodium chloride 0.9 % bolus 1,000 mL (0 mLs Intravenous Stopped 07/23/19 2001)        Consult Orders  (From admission, onward)         Start     Ordered   07/23/19 1742  Consult to hospitalist  ALL PATIENTS BEING ADMITTED/HAVING PROCEDURES NEED COVID-19 SCREENING Called [facility] spoke with Michele Mcalpine  Once    Comments: ALL PATIENTS BEING ADMITTED/HAVING PROCEDURES NEED COVID-19 SCREENING  Provider:  (Not yet assigned)  Question Answer Comment  Place call to: [facility]   Reason for Consult  Admit      07/23/19 1741           Significant initial  Findings: Abnormal Labs Reviewed  CBC WITH DIFFERENTIAL/PLATELET - Abnormal; Notable for the following components:      Result Value   WBC 22.4 (*)    RBC 6.20 (*)    Hemoglobin 18.7 (*)    HCT 54.5 (*)    Neutro Abs 18.2 (*)    Monocytes Absolute 1.2 (*)    Abs Immature Granulocytes 0.12 (*)    All other components within normal limits  COMPREHENSIVE METABOLIC PANEL - Abnormal; Notable for the following components:   Sodium 132 (*)    Potassium 3.4 (*)    Chloride 96 (*)    CO2 20 (*)    Glucose, Bld 131 (*)    BUN 31 (*)     Creatinine, Ser 2.40 (*)    Total Protein 10.3 (*)    Albumin 5.2 (*)    GFR calc non Af Amer 34 (*)    GFR calc Af Amer 39 (*)    Anion gap 16 (*)    All other components within normal limits  RAPID URINE DRUG SCREEN, HOSP PERFORMED - Abnormal; Notable for the following components:   Tetrahydrocannabinol POSITIVE (*)    All other components within normal limits  URINALYSIS, ROUTINE W REFLEX MICROSCOPIC - Abnormal; Notable for the following components:   Hgb urine dipstick SMALL (*)    Protein, ur 100 (*)    All other components within normal limits  URINALYSIS, MICROSCOPIC (REFLEX) - Abnormal; Notable for the following components:   Bacteria, UA FEW (*)    All other components within normal limits     Otherwise labs showing:    Recent Labs  Lab 07/23/19 1556  NA 132*  K 3.4*  CO2 20*  GLUCOSE 131*  BUN 31*  CREATININE 2.40*  CALCIUM 10.3    Cr   Up from baseline see below Lab Results  Component Value Date   CREATININE 2.40 (H) 07/23/2019   CREATININE 1.20 06/10/2019   CREATININE 1.17 06/08/2019    Recent Labs  Lab 07/23/19 1556  AST 33  ALT 20  ALKPHOS 67  BILITOT 1.1  PROT 10.3*  ALBUMIN 5.2*   Lab Results  Component Value Date   CALCIUM 10.3 07/23/2019      WBC      Component Value Date/Time   WBC 22.4 (H) 07/23/2019 1556   ANC    Component Value Date/Time   NEUTROABS 18.2 (H) 07/23/2019 1556   NEUTROABS 12.8 (H) 11/24/2013 1333   ALC No components found for: LYMPHAB    Plt: Lab Results  Component Value Date   PLT 316 07/23/2019    Lactic Acid, Venous    Component Value Date/Time   LATICACIDVEN 2.97 (HH) 01/04/2015 1701     COVID-19 Labs  No results for input(s): DDIMER, FERRITIN, LDH, CRP in the last 72 hours.  No results found for: SARSCOV2NAA   HG/HCT Up from baseline see below    Component Value Date/Time   HGB 18.7 (H) 07/23/2019 1556   HGB 16.9 11/24/2013 1333   HCT 54.5 (H) 07/23/2019 1556   HCT 51.1 11/24/2013  1333    Recent Labs  Lab 07/23/19 1556  LIPASE 26     ECG: Ordered Personally reviewed by me showing: HR : 71 Rhythm:  NSR,    nonspecific changes  QTC 451    UA  no evidence of UTI  Urine analysis:    Component Value Date/Time   COLORURINE YELLOW 07/23/2019 2036   APPEARANCEUR CLEAR 07/23/2019 2036   APPEARANCEUR Clear 11/24/2013 1333   LABSPEC 1.025 07/23/2019 2036   LABSPEC 1.030 11/24/2013 1333   PHURINE 6.0 07/23/2019 2036   GLUCOSEU NEGATIVE 07/23/2019 2036   GLUCOSEU Negative 11/24/2013 1333   HGBUR SMALL (A) 07/23/2019 2036   BILIRUBINUR NEGATIVE 07/23/2019 2036   BILIRUBINUR 1+ 11/24/2013 1333   KETONESUR NEGATIVE 07/23/2019 2036   PROTEINUR 100 (A) 07/23/2019 2036   UROBILINOGEN 1.0 01/04/2015 1651   NITRITE NEGATIVE 07/23/2019 2036   LEUKOCYTESUR NEGATIVE 07/23/2019 2036   LEUKOCYTESUR Negative 11/24/2013 1333       Ordered   CTabd/pelvis -  nonacute   ED Triage Vitals  Enc Vitals Group     BP 07/23/19 1519 (!) 153/96     Pulse Rate 07/23/19 1519 93     Resp 07/23/19 1519 19     Temp 07/23/19 1519 98.8 F (37.1 C)     Temp Source 07/23/19 1519 Oral     SpO2 07/23/19 1519 99 %     Weight 07/23/19 1516 174 lb 2.6 oz (79 kg)     Height 07/23/19 1516 6\' 1"  (1.854 m)     Head Circumference --      Peak Flow --      Pain Score 07/23/19 1516 10     Pain Loc --      Pain Edu? --      Excl. in GC? --   TMAX(24)@       Latest  Blood pressure 135/81, pulse 70, temperature 98.2 F (36.8 C), temperature source Oral, resp. rate 18, height 6\' 1"  (1.854 m), weight 79 kg, SpO2 100 %.    Hospitalist was called for admission for hyperemesis in the setting of history of marijuana abuse resulting in AKI   Review of Systems:    Pertinent positives include: nausea, vomiting,chills, fatigue,   Constitutional:  No weight loss, night sweats, Fevers, weight loss  HEENT:  No headaches, Difficulty swallowing,Tooth/dental problems,Sore throat,  No  sneezing, itching, ear ache, nasal congestion, post nasal drip,  Cardio-vascular:  No chest pain, Orthopnea, PND, anasarca, dizziness, palpitations.no Bilateral lower extremity swelling  GI:  No heartburn, indigestion, abdominal pain,  diarrhea, change in bowel habits, loss of appetite, melena, blood in stool, hematemesis Resp:  no shortness of breath at rest. No dyspnea on exertion, No excess mucus, no productive cough, No non-productive cough, No coughing up of blood.No change in color of mucus.No wheezing. Skin:  no rash or lesions. No jaundice GU:  no dysuria, change in color of urine, no urgency or frequency. No straining to urinate.  No flank pain.  Musculoskeletal:  No joint pain or no joint swelling. No decreased range of motion. No back pain.  Psych:  No change in mood or affect. No depression or anxiety. No memory loss.  Neuro: no localizing neurological complaints, no tingling, no weakness, no double vision, no gait abnormality, no slurred speech, no confusion  All systems reviewed and apart from HOPI all are negative  Past Medical History:   Past Medical History:  Diagnosis Date   Asthma    HIV (human immunodeficiency virus infection) (HCC)    Pancreatitis       History reviewed. No pertinent surgical history.  Social History:  Ambulatory  independently      reports that he has been smoking cigarettes. He has been smoking about 1.00 pack per day.  He has never used smokeless tobacco. He reports current alcohol use. He reports current drug use. Drugs: Cocaine and Marijuana.   Family History:   Family History  Problem Relation Age of Onset   CAD Neg Hx    Diabetes Neg Hx     Allergies: No Known Allergies   Prior to Admission medications   Medication Sig Start Date End Date Taking? Authorizing Provider  bictegravir-emtricitabine-tenofovir AF (BIKTARVY) 50-200-25 MG TABS tablet Take by mouth. 04/03/19  Yes [provider]    abacavir-dolutegravir-lamiVUDine (TRIUMEQ) 600-50-300 MG tablet Take 1 tablet by mouth daily. 10/02/17   [provider]  metoCLOPramide (REGLAN) 10 MG tablet Take 1 tablet (10 mg total) by mouth every 6 (six) hours as needed for nausea or vomiting. 06/10/19   Molpus, John, MD  pantoprazole (PROTONIX) 20 MG tablet Take 1 tablet (20 mg total) by mouth daily. 06/08/19   Tegeler, Canary Brim, MD  famotidine (PEPCID) 20 MG tablet Take 1 tablet (20 mg total) by mouth 2 (two) times daily. 10/11/17 06/10/19  Elson Areas, PA-C  sucralfate (CARAFATE) 1 g tablet Take 1 tablet (1 g total) by mouth 4 (four) times daily -  with meals and at bedtime. 06/10/19 06/10/19  Molpus, John, MD   Physical Exam: Blood pressure 135/81, pulse 70, temperature 98.2 F (36.8 C), temperature source Oral, resp. rate 18, height  (1.854 m), weight 79 kg, SpO2 100 %. 1. General:  in No  Acute distress   well  -appearing 2. Psychological: Alert and  Oriented 3. Head/ENT:    Dry Mucous Membranes                          Head Non traumatic, neck supple                           Poor Dentition 4. SKIN:   decreased Skin turgor,  Skin clean Dry and intact no rash 5. Heart: Regular rate and rhythm no  Murmur, no Rub or gallop 6. Lungs:  , no wheezes or crackles   7. Abdomen: Soft,  non-tender, Non distended   bowel sounds present 8. Lower extremities: no clubbing, cyanosis, no  edema 9. Neurologically Grossly intact, moving all 4 extremities equally  10. MSK: Normal range of motion   All other LABS:     Recent Labs  Lab 07/23/19 1556  WBC 22.4*  NEUTROABS 18.2*  HGB 18.7*  HCT 54.5*  MCV 87.9  PLT 316     Recent Labs  Lab 07/23/19 1556  NA 132*  K 3.4*  CL 96*  CO2 20*  GLUCOSE 131*  BUN 31*  CREATININE 2.40*  CALCIUM 10.3     Recent Labs  Lab 07/23/19 1556  AST 33  ALT 20  ALKPHOS 67  BILITOT 1.1  PROT 10.3*  ALBUMIN 5.2*      Cultures: No results found for: SDES, SPECREQUEST,  CULT, REPTSTATUS   Radiological Exams on Admission: Ct Abdomen Pelvis Wo Contrast  Result Date: 07/23/2019 CLINICAL DATA:  Abdominal pain and nausea and vomiting for 2 days. Elevated white blood cell count. History of renal failure and HIV positivity. EXAM: CT ABDOMEN AND PELVIS WITHOUT CONTRAST TECHNIQUE: Multidetector CT imaging of the abdomen and pelvis was performed following the standard protocol without IV contrast. COMPARISON:  CT scan 05/31/2019 FINDINGS: Lower chest: The lung bases are clear of acute process. No pleural effusion or  pulmonary lesions. The heart is normal in size. No pericardial effusion. The distal esophagus and aorta are unremarkable. Hepatobiliary: No focal hepatic lesions are identified without contrast. The gallbladder appears normal. No common bile duct dilatation. Pancreas: No mass, inflammation or ductal dilatation. Spleen: Normal size.  No focal lesions. Adrenals/Urinary Tract: The adrenal glands and kidneys are grossly normal. No renal, ureteral or bladder calculi or mass is identified without contrast. Stomach/Bowel: The stomach, duodenum, small bowel and colon are grossly normal. No obvious acute inflammatory changes, mass lesions or obstructive findings. The terminal ileum is grossly normal. Do not see the appendix for certain but I do not see any definite findings to suggest acute appendicitis. Vascular/Lymphatic: The aorta is normal in caliber. No atheroscerlotic calcifications. No mesenteric of retroperitoneal mass or adenopathy. Small scattered lymph nodes are noted. Reproductive: The prostate gland and seminal vesicles are unremarkable. Other: Right inguinal hernia containing fluid is noted. Musculoskeletal: No significant bony findings. IMPRESSION: 1. No acute abdominal/pelvic findings, mass lesions or adenopathy identified without oral or IV contrast. 2. Right inguinal hernia containing fluid. Electronically Signed   By: Rudie Meyer M.D.   On: 07/23/2019 17:31     Chart has been reviewed    Assessment/Plan   32 y.o. male with medical history significant of marijuana abuse,  HIV  EtOH abuse hx of tobacco abuse Admitted for hyperemesis in the setting of marijuana abuse resulting in AKI  Present on Admission:  AKI (acute kidney injury) (HCC) setting of hyperemesis we will rehydrate and follow kidney function obtain urine electrolytes CT scan showing no renal abnormalities Check CK level   HIV (human immunodeficiency virus infection) (HCC) -continue home medications check CD4 count and viral load patient need to follow-up with infectious disease at [facility]   Cannabinoid hyperemesis syndrome -spoke about importance of discontinuing marijuana abuse.  This is patient's recurrent admission prior was to [facility] with similar presentation.  Supportive management and antiemetics with rehydration ordered   Hypokalemia -replace and check magnesium level   Leukocytosis -chronic at this point no evidence of infectious process could be also hemoconcentration   Tobacco abuse spoke about importance of discontinuation.   Alcohol abuse, in remission states he have not had drink in the past 3 weeks denies shaking or other signs of alcohol withdrawal.  Continue to monitor for any sign of withdrawals at this point appears stable  Abdominal pain likely related to intractable nausea and vomiting at this point CT scan was done and showed no evidence of intra-abdominal abnormality.  Supportive management would attempt to avoid IV narcotics if possible as this can lead to constipation and other side effects  Other plan as per orders.  DVT prophylaxis:  SCD     Code Status:  FULL CODE   as per patient  I had personally discussed CODE STATUS with patient    Family Communication:   Family not at  Bedside    Disposition Plan:     To home once workup is complete and patient is stable                      Consults called: none   Admission status:   ED Disposition    ED Disposition Condition Comment   Admit  Hospital Area: [facility] [[id number]]  Level of Care: Med-Surg [16]  Covid Evaluation: Asymptomatic Screening Protocol (No Symptoms)  Diagnosis: AKI (acute kidney injury) [facility]) [[id number]]  Admitting Physician: Therisa Doyne [[id number]]  Attending Physician:  Chrisopher Pustejovsky [[id number]]  PT Class (Do Not Modify): Observation [104]  PT Acc Code (Do Not Modify): Observation [10022]       Obs      Level of care        medical floor     Precautions:  NONe  PPE: Used by the provider:   P100  eye Goggles,  Gloves     Mikelle Myrick 07/23/2019, 11:12 PM    [facility]     after 2 AM please page floor coverage PA If 7AM-7PM, please contact the day team taking care of the patient using [email or web address]

## 2019-07-23 NOTE — ED Notes (Signed)
C/o abd pain w n/v x 2 days  Denies etoh in last 2-3 weeks

## 2019-07-23 NOTE — ED Notes (Signed)
Patient aware that we need urine sample for testing, unable at this time. Pt given instruction on providing urine sample when able to do so.   

## 2019-07-23 NOTE — ED Provider Notes (Addendum)
7:06 PM Admitted to Dr. Roel Cluck.  Will check UDS, UA, EKG.  Patient rechecked.  Appears comfortable.  States that he used to smoke marijuana very heavily.  Endorses intermittent use, not daily use, at the current time.  We will give additional fluids.  Pending admission.  BP (!) 153/96   Pulse 93   Temp 98.8 F (37.1 C) (Oral)   Resp 19   Ht 6\' 1"  (1.854 m)   Wt 79 kg   SpO2 99%   BMI 22.98 kg/m   ED ECG REPORT   Date: 07/23/2019  Rate: 79  Rhythm: normal sinus rhythm  QRS Axis: normal  Intervals: normal  ST/T Wave abnormalities: normal  Conduction Disutrbances:none  Narrative Interpretation: Qtc shorter today.  Old EKG Reviewed: unchanged  I have personally reviewed the EKG tracing and agree with the computerized printout as noted.    Carlisle Cater, PA-C 07/23/19 1907    Carlisle Cater, PA-C 07/23/19 1933    Fredia Sorrow, MD 08/01/19 [phone]

## 2019-07-23 NOTE — ED Triage Notes (Signed)
Abdominal pain and vomiting. States he has not been drinking alcohol x 2 weeks.

## 2019-07-23 NOTE — ED Notes (Signed)
[facility] notified (Tammy) - patient ready for transport 

## 2019-07-24 DIAGNOSIS — M6282 Rhabdomyolysis: Secondary | ICD-10-CM | POA: Diagnosis present

## 2019-07-24 LAB — CBC
HCT: 46.2 % (ref 39.0–52.0)
Hemoglobin: 15.7 g/dL (ref 13.0–17.0)
MCH: 30.7 pg (ref 26.0–34.0)
MCHC: 34 g/dL (ref 30.0–36.0)
MCV: 90.4 fL (ref 80.0–100.0)
Platelets: 257 10*3/uL (ref 150–400)
RBC: 5.11 MIL/uL (ref 4.22–5.81)
RDW: 13 % (ref 11.5–15.5)
WBC: 17.6 10*3/uL — ABNORMAL HIGH (ref 4.0–10.5)
nRBC: 0 % (ref 0.0–0.2)

## 2019-07-24 LAB — COMPREHENSIVE METABOLIC PANEL
ALT: 18 U/L (ref 0–44)
AST: 32 U/L (ref 15–41)
Albumin: 3.8 g/dL (ref 3.5–5.0)
Alkaline Phosphatase: 47 U/L (ref 38–126)
Anion gap: 8 (ref 5–15)
BUN: 22 mg/dL — ABNORMAL HIGH (ref 6–20)
CO2: 21 mmol/L — ABNORMAL LOW (ref 22–32)
Calcium: 8.6 mg/dL — ABNORMAL LOW (ref 8.9–10.3)
Chloride: 103 mmol/L (ref 98–111)
Creatinine, Ser: 1.48 mg/dL — ABNORMAL HIGH (ref 0.61–1.24)
GFR calc Af Amer: >60 mL/min (ref >60)
GFR calc non Af Amer: >60 mL/min (ref >60)
Glucose, Bld: 135 mg/dL — ABNORMAL HIGH (ref 70–99)
Potassium: 3.7 mmol/L (ref 3.5–5.1)
Sodium: 132 mmol/L — ABNORMAL LOW (ref 135–145)
Total Bilirubin: 1.2 mg/dL (ref 0.3–1.2)
Total Protein: 7.4 g/dL (ref 6.5–8.1)

## 2019-07-24 LAB — TSH: TSH: 1.589 u[IU]/mL (ref 0.350–4.500)

## 2019-07-24 LAB — CREATININE, URINE, RANDOM: Creatinine, Urine: 327.7 mg/dL

## 2019-07-24 LAB — ETHANOL: Alcohol, Ethyl (B): <10 mg/dL (ref <10)

## 2019-07-24 LAB — LACTIC ACID, PLASMA: Lactic Acid, Venous: 0.9 mmol/L (ref 0.5–1.9)

## 2019-07-24 LAB — PHOSPHORUS: Phosphorus: 2.7 mg/dL (ref 2.5–4.6)

## 2019-07-24 LAB — MAGNESIUM: Magnesium: 1.7 mg/dL (ref 1.7–2.4)

## 2019-07-24 LAB — SODIUM, URINE, RANDOM: Sodium, Ur: 32 mmol/L

## 2019-07-24 LAB — SARS CORONAVIRUS 2 (TAT 6-24 HRS): SARS Coronavirus 2: NEGATIVE

## 2019-07-24 LAB — CK: Total CK: 990 U/L — ABNORMAL HIGH (ref 49–397)

## 2019-07-24 MED ORDER — INFLUENZA VAC SPLIT QUAD 0.5 ML IM SUSY: 0.5000 mL | PREFILLED_SYRINGE | INTRAMUSCULAR | Status: DC

## 2019-07-24 MED ORDER — SODIUM CHLORIDE 0.9 % IV SOLN: INTRAVENOUS | Status: DC

## 2019-07-24 MED ORDER — MAGNESIUM SULFATE IN D5W 1-5 GM/100ML-% IV SOLN
1.0000 g | Freq: Once | INTRAVENOUS | Status: AC
  Administered 2019-07-24: 1 g via INTRAVENOUS
  Filled 2019-07-24: qty 100

## 2019-07-24 MED ORDER — PNEUMOCOCCAL VAC POLYVALENT 25 MCG/0.5ML IJ INJ: 0.5000 mL | INJECTION | INTRAMUSCULAR | Status: DC

## 2019-07-24 MED ORDER — MAGNESIUM SULFATE 50 % IJ SOLN: 1.0000 g | Freq: Once | INTRAMUSCULAR | Status: DC

## 2019-07-24 MED ORDER — SODIUM CHLORIDE 0.9 % IV BOLUS
1000.0000 mL | Freq: Once | INTRAVENOUS | Status: AC
  Administered 2019-07-24: 1000 mL via INTRAVENOUS

## 2019-07-24 NOTE — Progress Notes (Signed)
Pt stable with no needs at time of discharge. Pt was able to ambulate with no needs at time of d/c. Pt denies pain. Pt was able to eat well prior to d/c.

## 2019-07-24 NOTE — Plan of Care (Signed)
  Problem: Activity: Goal: Risk for activity intolerance will decrease Outcome: Progressing   Problem: Nutrition: Goal: Adequate nutrition will be maintained Outcome: Progressing   Problem: Safety: Goal: Ability to remain free from injury will improve Outcome: Progressing   

## 2019-07-24 NOTE — Progress Notes (Addendum)
Patient arrived at approximately 2149 from [facility]. He is alert and verbally responsive. Denies pain or nausea at this time but c/o being hungry as he had not eaten for the last 3 days. Will inform Dr. Roel Cluck of pt's arrival. Patient has a tracking device to his left ankle.

## 2019-07-24 NOTE — Discharge Summary (Signed)
Carlos Guzman, is a 33 y.o. male  DOB 1986-07-05  MRN [id number].  Admission date:  07/23/2019  Admitting Physician  Therisa DoyneAnastassia Doutova, MD  Discharge Date:  07/24/2019   Primary MD  Patient, No Pcp Per  Recommendations for primary care physician for things to follow:  -Please check CBC, BMP during next visit. -Continue counseling about THC/tobacco use cessation.   Admission Diagnosis  Dehydration [E86.0] Upper abdominal pain [R10.10] AKI (acute kidney injury) (HCC) [N17.9]   Discharge Diagnosis  Dehydration [E86.0] Upper abdominal pain [R10.10] AKI (acute kidney injury) (HCC) [N17.9]   Active Problems:   AKI (acute kidney injury) (HCC)   HIV (human immunodeficiency virus infection) (HCC)   Cannabinoid hyperemesis syndrome   Hypokalemia   Leukocytosis   Tobacco abuse   Alcohol abuse, in remission   Rhabdomyolysis      Past Medical History:  Diagnosis Date  . Asthma   . HIV (human immunodeficiency virus infection) (HCC)   . Pancreatitis     History reviewed. No pertinent surgical history.     History of present illness and  Hospital Course:     Kindly see H&P for history of present illness and admission details, please review complete Labs, Consult reports and Test reports for all details in brief  HPI  from the history and physical done on the day of admission 07/23/2019  Carlos Guzman is a 33 y.o. male with medical history significant of marijuana abuse,  HIV  EtOH abuse hx of tobacco abuse  Presented with    abdominal pain nausea and vomiting ports he is feeling somewhat chilly.  Denies any Covid exposure.  Denies any fevers no chest pain or shortness of breath patient states his abdomen is hurting he does not wish to talk to me in details.  Reports he used to drink EtOh but quit 3 wks Reports Milks tobacco occasionally but has been smoking marijuana on a regular basis  although states he has been cutting down a bit  States he is [facility] States that he is unsure when was the last time his CD4 count was checked  Of note per review of records patient has longstanding history of elevated white blood cell count going back to September 2020  [facility] also presented with abdominal pain nausea and vomiting That time CT scan abdomen also was unremarkable Infectious risk factors:   Hospital Course   AKI  -In the setting of volume depletion and dehydration from hyperemesis, he was started on IV fluid which has helped significantly, continue on admission was doubled his baseline, creatinine was 2.4 on admission, improved with IV fluid, it is 1.48 today, is tolerating oral intake, he was encouraged to increase his fluid intake mainly, .  HIV (human immunodeficiency virus infection) (HCC) -continue home medications   Cannabinoid hyperemesis syndrome  -Counseled patient about importance to discontinue marijuana, as it is causing his symptoms, CT abdomen pelvis with no acute finding.  Hypokalemia -repleted  Leukocytosis -chronic at this point no evidence of infectious process could be also hemoconcentration  Tobacco abuse spoke about importance of discontinuation.  Alcohol abuse, in remission states he have not had drink in the past 3 weeks denies shaking or other signs of alcohol withdrawal.  He had no signs of withdrawals during hospital stay.  Abdominal pain likely related to intractable nausea and vomiting at this point CT scan was done and showed no evidence of intra-abdominal abnormality.  Abdominal exam is benign today.  Discharge Condition: Stable    Discharge Instructions  and  Discharge Medications     Discharge Instructions    Discharge instructions   Complete by: As directed    Follow with Primary MD  in 7 days   Get CBC, CMP,checked  by Primary MD next  visit.    Activity: As tolerated with Full fall precautions use walker/cane & assistance as needed   Disposition Home    Diet: Soft Diet   On your next visit with your primary care physician please Get Medicines reviewed and adjusted.   Please request your Prim.MD to go over all Hospital Tests and Procedure/Radiological results at the follow up, please get all Hospital records sent to your Prim MD by signing hospital release before you go home.   If you experience worsening of your admission symptoms, develop shortness of breath, life threatening emergency, suicidal or homicidal thoughts you must seek medical attention immediately by calling 911 or calling your MD immediately  if symptoms less severe.  You Must read complete instructions/literature along with all the possible adverse reactions/side effects for all the Medicines you take and that have been prescribed to you. Take any new Medicines after you have completely understood and accpet all the possible adverse reactions/side effects.   Do not drive, operating heavy machinery, perform activities at heights, swimming or participation in water activities or provide baby sitting services if your were admitted for syncope or siezures until you have seen by Primary MD or a Neurologist and advised to do so again.  Do not drive when taking Pain medications.    Do not take more than prescribed Pain, Sleep and Anxiety Medications  Special Instructions: If you have smoked or chewed Tobacco  in the last 2 yrs please stop smoking, stop any regular Alcohol  and or any Recreational drug use.  Wear Seat belts while driving.   Please note  You were cared for by a hospitalist during your hospital stay. If you have any questions about your discharge medications or the care you received while you were in the hospital after you are discharged, you can call the unit and asked to speak with the hospitalist on call if the hospitalist that took  care of you is not available. Once you are discharged, your primary care physician will handle any further medical issues. Please note that NO REFILLS for any discharge medications will be authorized once you are discharged, as it is imperative that you return to your primary care physician (or establish a relationship with a primary care physician if you do not have one) for your aftercare needs so that they can reassess your need for medications and monitor your lab values.   Increase activity slowly   Complete by: As directed      Allergies as of 07/24/2019   No Known Allergies     Medication List    TAKE these medications   abacavir-dolutegravir-lamiVUDine 600-50-300 MG tablet Commonly known as: TRIUMEQ  Take 1 tablet by mouth daily.   bictegravir-emtricitabine-tenofovir AF 50-200-25 MG Tabs tablet Commonly known as: BIKTARVY Take 1 tablet by mouth daily.   metoCLOPramide 10 MG tablet Commonly known as: Reglan Take 1 tablet (10 mg total) by mouth every 6 (six) hours as needed for nausea or vomiting.   pantoprazole 20 MG tablet Commonly known as: PROTONIX Take 1 tablet (20 mg total) by mouth daily.         Diet and Activity recommendation: See Discharge Instructions above  Consults obtained - None   Major procedures and Radiology Reports - PLEASE review detailed and final reports for all details, in brief -      Ct Abdomen Pelvis Wo Contrast  Result Date: 07/23/2019 CLINICAL DATA:  Abdominal pain and nausea and vomiting for 2 days. Elevated white blood cell count. History of renal failure and HIV positivity. EXAM: CT ABDOMEN AND PELVIS WITHOUT CONTRAST TECHNIQUE: Multidetector CT imaging of the abdomen and pelvis was performed following the standard protocol without IV contrast. COMPARISON:  CT scan 05/31/2019 FINDINGS: Lower chest: The lung bases are clear of acute process. No pleural effusion or pulmonary lesions. The heart is normal in size. No pericardial effusion.  The distal esophagus and aorta are unremarkable. Hepatobiliary: No focal hepatic lesions are identified without contrast. The gallbladder appears normal. No common bile duct dilatation. Pancreas: No mass, inflammation or ductal dilatation. Spleen: Normal size.  No focal lesions. Adrenals/Urinary Tract: The adrenal glands and kidneys are grossly normal. No renal, ureteral or bladder calculi or mass is identified without contrast. Stomach/Bowel: The stomach, duodenum, small bowel and colon are grossly normal. No obvious acute inflammatory changes, mass lesions or obstructive findings. The terminal ileum is grossly normal. Do not see the appendix for certain but I do not see any definite findings to suggest acute appendicitis. Vascular/Lymphatic: The aorta is normal in caliber. No atheroscerlotic calcifications. No mesenteric of retroperitoneal mass or adenopathy. Small scattered lymph nodes are noted. Reproductive: The prostate gland and seminal vesicles are unremarkable. Other: Right inguinal hernia containing fluid is noted. Musculoskeletal: No significant bony findings. IMPRESSION: 1. No acute abdominal/pelvic findings, mass lesions or adenopathy identified without oral or IV contrast. 2. Right inguinal hernia containing fluid. Electronically Signed   By: Rudie Meyer M.D.   On: 07/23/2019 17:31    Micro Results    Recent Results (from the past 240 hour(s))  SARS CORONAVIRUS 2 (TAT 6-24 HRS) Nasopharyngeal Nasopharyngeal Swab     Status: None   Collection Time: 07/23/19  6:15 PM   Specimen: Nasopharyngeal Swab  Result Value Ref Range Status   SARS Coronavirus 2 NEGATIVE NEGATIVE Final    Comment: (NOTE) SARS-CoV-2 target nucleic acids are NOT DETECTED. The SARS-CoV-2 RNA is generally detectable in upper and lower respiratory specimens during the acute phase of infection. Negative results do not preclude SARS-CoV-2 infection, do not rule out co-infections with other pathogens, and should not be  used as the sole basis for treatment or other patient management decisions. Negative results must be combined with clinical observations, patient history, and epidemiological information. The expected result is Negative. Fact Sheet for Patients: HairSlick.no Fact Sheet for Healthcare Providers: quierodirigir.com This test is not yet approved or cleared by the [place] FDA and  has been authorized for detection and/or diagnosis of SARS-CoV-2 by FDA under an Emergency Use Authorization (EUA). This EUA will remain  in effect (meaning this test can be used) for the duration of the COVID-19 declaration under Section 56 4(b)(1)  of the Act, 21 U.S.C. section 360bbb-3(b)(1), unless the authorization is terminated or revoked sooner. Performed at [facility], [address] [place], [place] [address]        Today   Subjective:   Carlos Guzman today nausea and vomiting has improved, he was able to tolerate his breakfast with no nausea or vomiting.  Objective:   Blood pressure (!) 168/99, pulse 60, temperature 97.6 F (36.4 C), resp. rate 18, height 6\' 1"  (1.854 m), weight 79.2 kg, SpO2 100 %.   Intake/Output Summary (Last 24 hours) at 07/24/2019 1036 Last data filed at 07/24/2019 0944 Gross per 24 hour  Intake 2448.49 ml  Output 100 ml  Net 2348.49 ml    Exam Awake Alert, Oriented x 3, No new F.N deficits, Normal affect Symmetrical Chest wall movement, Good air movement bilaterally, CTAB RRR,No Gallops,Rubs or new Murmurs, No Parasternal Heave +ve B.Sounds, Abd Soft, Non tender,  No rebound -guarding or rigidity. No Cyanosis, Clubbing or edema, No new Rash or bruise  Data Review   CBC w Diff:  Lab Results  Component Value Date   WBC 17.6 (H) 07/24/2019   HGB 15.7 07/24/2019   HGB 16.9 11/24/2013   HCT 46.2 07/24/2019   HCT 51.1 11/24/2013   PLT 257 07/24/2019   PLT 240 11/24/2013   LYMPHOPCT 12  07/23/2019   LYMPHOPCT 31.1 11/24/2013   MONOPCT 6 07/23/2019   MONOPCT 6.5 11/24/2013   EOSPCT 0 07/23/2019   EOSPCT 0.1 11/24/2013   BASOPCT 0 07/23/2019   BASOPCT 0.5 11/24/2013    CMP:  Lab Results  Component Value Date   NA 132 (L) 07/24/2019   NA 137 11/24/2013   K 3.7 07/24/2019   K 3.7 11/24/2013   CL 103 07/24/2019   CL 102 11/24/2013   CO2 21 (L) 07/24/2019   CO2 25 11/24/2013   BUN 22 (H) 07/24/2019   BUN 8 11/24/2013   CREATININE 1.48 (H) 07/24/2019   CREATININE 1.31 (H) 11/24/2013   PROT 7.4 07/24/2019   PROT 9.4 (H) 11/24/2013   ALBUMIN 3.8 07/24/2019   ALBUMIN 4.2 11/24/2013   BILITOT 1.2 07/24/2019   BILITOT 0.5 11/24/2013   ALKPHOS 47 07/24/2019   ALKPHOS 76 11/24/2013   AST 32 07/24/2019   AST 135 (H) 11/24/2013   ALT 18 07/24/2019   ALT 120 (H) 11/24/2013  .   Total Time in preparing paper work, data evaluation and todays exam - 30 minutes  11/26/2013 M.D on 07/24/2019 at 10:36 AM  [facility]   Office  [phone]

## 2019-07-24 NOTE — Discharge Instructions (Signed)
Follow with Primary MD  in 7 days   Get CBC, CMP,checked  by Primary MD next visit.    Activity: As tolerated with Full fall precautions use walker/cane & assistance as needed   Disposition Home    Diet: Soft Diet   On your next visit with your primary care physician please Get Medicines reviewed and adjusted.   Please request your Prim.MD to go over all Hospital Tests and Procedure/Radiological results at the follow up, please get all Hospital records sent to your Prim MD by signing hospital release before you go home.   If you experience worsening of your admission symptoms, develop shortness of breath, life threatening emergency, suicidal or homicidal thoughts you must seek medical attention immediately by calling 911 or calling your MD immediately  if symptoms less severe.  You Must read complete instructions/literature along with all the possible adverse reactions/side effects for all the Medicines you take and that have been prescribed to you. Take any new Medicines after you have completely understood and accpet all the possible adverse reactions/side effects.   Do not drive, operating heavy machinery, perform activities at heights, swimming or participation in water activities or provide baby sitting services if your were admitted for syncope or siezures until you have seen by Primary MD or a Neurologist and advised to do so again.  Do not drive when taking Pain medications.    Do not take more than prescribed Pain, Sleep and Anxiety Medications  Special Instructions: If you have smoked or chewed Tobacco  in the last 2 yrs please stop smoking, stop any regular Alcohol  and or any Recreational drug use.  Wear Seat belts while driving.   Please note  You were cared for by a hospitalist during your hospital stay. If you have any questions about your discharge medications or the care you received while you were in the hospital after you are discharged, you can call the unit  and asked to speak with the hospitalist on call if the hospitalist that took care of you is not available. Once you are discharged, your primary care physician will handle any further medical issues. Please note that NO REFILLS for any discharge medications will be authorized once you are discharged, as it is imperative that you return to your primary care physician (or establish a relationship with a primary care physician if you do not have one) for your aftercare needs so that they can reassess your need for medications and monitor your lab values.

## 2019-07-25 ENCOUNTER — Emergency Department (HOSPITAL_BASED_OUTPATIENT_CLINIC_OR_DEPARTMENT_OTHER)
Admission: EM | Admit: 2019-07-25 | Discharge: 2019-07-25 | Disposition: A | Discharge status: Home / Self Care | Payer: Self-pay | Attending: Emergency Medicine | Admitting: Emergency Medicine

## 2019-07-25 ENCOUNTER — Other Ambulatory Visit: Payer: Self-pay

## 2019-07-25 ENCOUNTER — Encounter (HOSPITAL_BASED_OUTPATIENT_CLINIC_OR_DEPARTMENT_OTHER): Payer: Self-pay | Admitting: Emergency Medicine

## 2019-07-25 DIAGNOSIS — J45909 Unspecified asthma, uncomplicated: Secondary | ICD-10-CM | POA: Insufficient documentation

## 2019-07-25 DIAGNOSIS — G8929 Other chronic pain: Secondary | ICD-10-CM | POA: Insufficient documentation

## 2019-07-25 DIAGNOSIS — R109 Unspecified abdominal pain: Secondary | ICD-10-CM | POA: Insufficient documentation

## 2019-07-25 DIAGNOSIS — R112 Nausea with vomiting, unspecified: Secondary | ICD-10-CM | POA: Insufficient documentation

## 2019-07-25 DIAGNOSIS — K5900 Constipation, unspecified: Secondary | ICD-10-CM | POA: Insufficient documentation

## 2019-07-25 DIAGNOSIS — F1410 Cocaine abuse, uncomplicated: Secondary | ICD-10-CM | POA: Insufficient documentation

## 2019-07-25 DIAGNOSIS — F17210 Nicotine dependence, cigarettes, uncomplicated: Secondary | ICD-10-CM | POA: Insufficient documentation

## 2019-07-25 DIAGNOSIS — B20 Human immunodeficiency virus [HIV] disease: Secondary | ICD-10-CM | POA: Insufficient documentation

## 2019-07-25 DIAGNOSIS — F1210 Cannabis abuse, uncomplicated: Secondary | ICD-10-CM | POA: Insufficient documentation

## 2019-07-25 DIAGNOSIS — Z79899 Other long term (current) drug therapy: Secondary | ICD-10-CM | POA: Insufficient documentation

## 2019-07-25 LAB — CBC WITH DIFFERENTIAL/PLATELET
Abs Immature Granulocytes: 0.13 10*3/uL — ABNORMAL HIGH (ref 0.00–0.07)
Basophils Absolute: 0.1 10*3/uL (ref 0.0–0.1)
Basophils Relative: 0 %
Eosinophils Absolute: 0 10*3/uL (ref 0.0–0.5)
Eosinophils Relative: 0 %
HCT: 50.5 % (ref 39.0–52.0)
Hemoglobin: 17.1 g/dL — ABNORMAL HIGH (ref 13.0–17.0)
Immature Granulocytes: 1 %
Lymphocytes Relative: 8 %
Lymphs Abs: 1.6 10*3/uL (ref 0.7–4.0)
MCH: 30.2 pg (ref 26.0–34.0)
MCHC: 33.9 g/dL (ref 30.0–36.0)
MCV: 89.2 fL (ref 80.0–100.0)
Monocytes Absolute: 0.8 10*3/uL (ref 0.1–1.0)
Monocytes Relative: 4 %
Neutro Abs: 17.4 10*3/uL — ABNORMAL HIGH (ref 1.7–7.7)
Neutrophils Relative %: 87 %
Platelets: 281 10*3/uL (ref 150–400)
RBC: 5.66 MIL/uL (ref 4.22–5.81)
RDW: 12.9 % (ref 11.5–15.5)
WBC: 19.9 10*3/uL — ABNORMAL HIGH (ref 4.0–10.5)
nRBC: 0 % (ref 0.0–0.2)

## 2019-07-25 LAB — COMPREHENSIVE METABOLIC PANEL
ALT: 24 U/L (ref 0–44)
AST: 31 U/L (ref 15–41)
Albumin: 4.3 g/dL (ref 3.5–5.0)
Alkaline Phosphatase: 54 U/L (ref 38–126)
Anion gap: 9 (ref 5–15)
BUN: 13 mg/dL (ref 6–20)
CO2: 22 mmol/L (ref 22–32)
Calcium: 9.7 mg/dL (ref 8.9–10.3)
Chloride: 105 mmol/L (ref 98–111)
Creatinine, Ser: 1.22 mg/dL (ref 0.61–1.24)
GFR calc Af Amer: >60 mL/min (ref >60)
GFR calc non Af Amer: >60 mL/min (ref >60)
Glucose, Bld: 140 mg/dL — ABNORMAL HIGH (ref 70–99)
Potassium: 3.6 mmol/L (ref 3.5–5.1)
Sodium: 136 mmol/L (ref 135–145)
Total Bilirubin: 1.1 mg/dL (ref 0.3–1.2)
Total Protein: 8.4 g/dL — ABNORMAL HIGH (ref 6.5–8.1)

## 2019-07-25 LAB — HIV-1 RNA QUANT-NO REFLEX-BLD
HIV 1 RNA Quant: <20 copies/mL
LOG10 HIV-1 RNA: UNDETERMINED log10copy/mL

## 2019-07-25 MED ORDER — FAMOTIDINE 20 MG PO TABS
20.0000 mg | ORAL_TABLET | Freq: Once | ORAL | Status: AC
  Administered 2019-07-25: 20 mg via ORAL
  Filled 2019-07-25: qty 1

## 2019-07-25 MED ORDER — ONDANSETRON 4 MG PO TBDP: 4.0000 mg | ORAL_TABLET | Freq: Three times a day (TID) | ORAL | 0 refills | Status: DC | PRN

## 2019-07-25 MED ORDER — KETOROLAC TROMETHAMINE 30 MG/ML IJ SOLN
30.0000 mg | Freq: Once | INTRAMUSCULAR | Status: AC
  Administered 2019-07-25: 30 mg via INTRAVENOUS
  Filled 2019-07-25: qty 1

## 2019-07-25 MED ORDER — DICYCLOMINE HCL 10 MG PO CAPS
10.0000 mg | ORAL_CAPSULE | Freq: Once | ORAL | Status: AC
  Administered 2019-07-25: 11:00:00 10 mg via ORAL
  Filled 2019-07-25: qty 1

## 2019-07-25 MED ORDER — DICYCLOMINE HCL 20 MG PO TABS: 20.0000 mg | ORAL_TABLET | Freq: Two times a day (BID) | ORAL | 0 refills | Status: DC | PRN

## 2019-07-25 MED ORDER — PROMETHAZINE HCL 25 MG/ML IJ SOLN
25.0000 mg | Freq: Once | INTRAMUSCULAR | Status: AC
  Administered 2019-07-25: 14:00:00 25 mg via INTRAVENOUS
  Filled 2019-07-25: qty 1

## 2019-07-25 MED ORDER — SODIUM CHLORIDE 0.9 % IV BOLUS
1000.0000 mL | Freq: Once | INTRAVENOUS | Status: AC
  Administered 2019-07-25: 12:00:00 1000 mL via INTRAVENOUS

## 2019-07-25 MED ORDER — OMEPRAZOLE 20 MG PO CPDR: 20.0000 mg | DELAYED_RELEASE_CAPSULE | Freq: Every day | ORAL | 0 refills | Status: DC

## 2019-07-25 MED ORDER — ALUM & MAG HYDROXIDE-SIMETH 200-200-20 MG/5ML PO SUSP
30.0000 mL | Freq: Once | ORAL | Status: AC
  Administered 2019-07-25: 30 mL via ORAL
  Filled 2019-07-25: qty 30

## 2019-07-25 MED ORDER — ONDANSETRON HCL 4 MG/2ML IJ SOLN
4.0000 mg | Freq: Once | INTRAMUSCULAR | Status: AC
  Administered 2019-07-25: 11:00:00 4 mg via INTRAVENOUS
  Filled 2019-07-25: qty 2

## 2019-07-25 NOTE — ED Provider Notes (Signed)
[facility] EMERGENCY DEPARTMENT Provider Note   CSN: [id number] Arrival date & time: 07/25/19  1021     History   Chief Complaint Chief Complaint  Patient presents with  . Abdominal Pain  . Constipation    HPI Calvyn Kurtzman is a 33 y.o. male with PMHx of HIV on biktarvy, cannabinoid hyperemesis syndrome, presenting to the ED with recurrent epigastric abd pain with nausea. Pt was just discharged yesterday from admission on 07/23/2019, initially seen by myself in the ED for similar symptoms. He was admitted for AKI due to dehydration from n/v. Neg Ct abd pelvis. Cr on admission was 2.4, at discharge yesterday was 1.48. He states he was discharged without any medications for symptom control. He felt well after discharge however yesterday evening began having the nausea with epigastric pain again. He has only had a couple episodes of emesis. He does feel constipated. He denies marijuana use after discharge. No fevers or new sx reported.     The history is provided by the patient and medical records.    Past Medical History:  Diagnosis Date  . Asthma   . HIV (human immunodeficiency virus infection) ([place])   . Pancreatitis     Patient Active Problem List   Diagnosis Date Noted  . Rhabdomyolysis 07/24/2019  . AKI (acute kidney injury) ([place]) 07/23/2019  . HIV (human immunodeficiency virus infection) ([place]) 07/23/2019  . Cannabinoid hyperemesis syndrome 07/23/2019  . Hypokalemia 07/23/2019  . Leukocytosis 07/23/2019  . Tobacco abuse 07/23/2019  . 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 No pertinent surgical history.      Home Medications    Prior to Admission medications   Medication Sig Start Date End Date Taking? Authorizing Provider  abacavir-dolutegravir-lamiVUDine (TRIUMEQ) 600-50-300 MG tablet Take 1 tablet by mouth daily. 10/02/17   [provider]  bictegravir-emtricitabine-tenofovir AF (BIKTARVY) 50-200-25 MG TABS tablet Take  1 tablet by mouth daily.  04/03/19   [provider]  dicyclomine (BENTYL) 20 MG tablet Take 1 tablet (20 mg total) by mouth 2 (two) times daily as needed (abdominal cramping). 07/25/19   Raymundo Rout, [place] N, PA-C  metoCLOPramide (REGLAN) 10 MG tablet Take 1 tablet (10 mg total) by mouth every 6 (six) hours as needed for nausea or vomiting. 06/10/19   Molpus, John, MD  omeprazole (PRILOSEC) 20 MG capsule Take 1 capsule (20 mg total) by mouth daily. 07/25/19   Elzie Sheets, [place] N, PA-C  ondansetron (ZOFRAN ODT) 4 MG disintegrating tablet Take 1 tablet (4 mg total) by mouth every 8 (eight) hours as needed for nausea or vomiting. 07/25/19   Laquanta Hummel, [place] N, PA-C  pantoprazole (PROTONIX) 20 MG tablet Take 1 tablet (20 mg total) by mouth daily. 06/08/19   Tegeler, Gwenyth Allegra, MD  famotidine (PEPCID) 20 MG tablet Take 1 tablet (20 mg total) by mouth 2 (two) times daily. 10/11/17 06/10/19  Fransico Meadow, PA-C  sucralfate (CARAFATE) 1 g tablet Take 1 tablet (1 g total) by mouth 4 (four) times daily -  with meals and at bedtime. 06/10/19 06/10/19  Molpus, John, MD    Family History Family History  Problem Relation Age of Onset  . CAD Neg Hx   . Diabetes Neg Hx     Social History Social History   Tobacco Use  . Smoking status: Current Every Day Smoker    Packs/day: 1.00    Types: Cigarettes  . Smokeless tobacco: Never Used  Substance Use Topics  . Alcohol use: Not  Currently  . Drug use: Yes    Types: Cocaine, Marijuana    Comment: pt reports use on 10/10/17     Allergies   Patient has no known allergies.   Review of Systems Review of Systems  All other systems reviewed and are negative.    Physical Exam Updated Vital Signs BP (!) 147/99 (BP Location: Right Arm)   Pulse 67   Temp 98.7 F (37.1 C) (Oral)   Resp 16   Ht 6\' 1"  (1.854 m)   Wt 79.4 kg   SpO2 100%   BMI 23.09 kg/m   Physical Exam Vitals signs and nursing note reviewed.  Constitutional:      General: He  is not in acute distress.    Appearance: He is well-developed. He is not ill-appearing.  HENT:     Head: Normocephalic and atraumatic.     Mouth/Throat:     Mouth: Mucous membranes are moist.  Eyes:     Conjunctiva/sclera: Conjunctivae normal.  Cardiovascular:     Rate and Rhythm: Normal rate and regular rhythm.  Pulmonary:     Effort: Pulmonary effort is normal. No respiratory distress.     Breath sounds: Normal breath sounds.  Abdominal:     General: Bowel sounds are normal.     Palpations: Abdomen is soft.     Tenderness: There is abdominal tenderness (generalized). There is no guarding or rebound.  Skin:    General: Skin is warm.  Neurological:     Mental Status: He is alert.  Psychiatric:        Behavior: Behavior normal.      ED Treatments / Results  Labs (all labs ordered are listed, but only abnormal results are displayed) Labs Reviewed  CBC WITH DIFFERENTIAL/PLATELET - Abnormal; Notable for the following components:      Result Value   WBC 19.9 (*)    Hemoglobin 17.1 (*)    Neutro Abs 17.4 (*)    Abs Immature Granulocytes 0.13 (*)    All other components within normal limits  COMPREHENSIVE METABOLIC PANEL - Abnormal; Notable for the following components:   Glucose, Bld 140 (*)    Total Protein 8.4 (*)    All other components within normal limits    EKG None  Radiology Ct Abdomen Pelvis Wo Contrast  Result Date: 07/23/2019 CLINICAL DATA:  Abdominal pain and nausea and vomiting for 2 days. Elevated white blood cell count. History of renal failure and HIV positivity. EXAM: CT ABDOMEN AND PELVIS WITHOUT CONTRAST TECHNIQUE: Multidetector CT imaging of the abdomen and pelvis was performed following the standard protocol without IV contrast. COMPARISON:  CT scan 05/31/2019 FINDINGS: Lower chest: The lung bases are clear of acute process. No pleural effusion or pulmonary lesions. The heart is normal in size. No pericardial effusion. The distal esophagus and aorta  are unremarkable. Hepatobiliary: No focal hepatic lesions are identified without contrast. The gallbladder appears normal. No common bile duct dilatation. Pancreas: No mass, inflammation or ductal dilatation. Spleen: Normal size.  No focal lesions. Adrenals/Urinary Tract: The adrenal glands and kidneys are grossly normal. No renal, ureteral or bladder calculi or mass is identified without contrast. Stomach/Bowel: The stomach, duodenum, small bowel and colon are grossly normal. No obvious acute inflammatory changes, mass lesions or obstructive findings. The terminal ileum is grossly normal. Do not see the appendix for certain but I do not see any definite findings to suggest acute appendicitis. Vascular/Lymphatic: The aorta is normal in caliber. No atheroscerlotic calcifications. No mesenteric  of retroperitoneal mass or adenopathy. Small scattered lymph nodes are noted. Reproductive: The prostate gland and seminal vesicles are unremarkable. Other: Right inguinal hernia containing fluid is noted. Musculoskeletal: No significant bony findings. IMPRESSION: 1. No acute abdominal/pelvic findings, mass lesions or adenopathy identified without oral or IV contrast. 2. Right inguinal hernia containing fluid. Electronically Signed   By: Rudie Meyer M.D.   On: 07/23/2019 17:31    Procedures Procedures (including critical care time)  Medications Ordered in ED Medications  ondansetron (ZOFRAN) injection 4 mg (4 mg Intravenous Given 07/25/19 1129)  sodium chloride 0.9 % bolus 1,000 mL (0 mLs Intravenous Stopped 07/25/19 1236)  dicyclomine (BENTYL) capsule 10 mg (10 mg Oral Given 07/25/19 1129)  ketorolac (TORADOL) 30 MG/ML injection 30 mg (30 mg Intravenous Given 07/25/19 1202)  promethazine (PHENERGAN) injection 25 mg (25 mg Intravenous Given 07/25/19 1421)  famotidine (PEPCID) tablet 20 mg (20 mg Oral Given 07/25/19 1449)  alum & mag hydroxide-simeth (MAALOX/MYLANTA) 200-200-20 MG/5ML suspension 30 mL (30 mLs  Oral Given 07/25/19 1449)     Initial Impression / Assessment and Plan / ED Course  I have reviewed the triage vital signs and the nursing notes.  Pertinent labs & imaging results that were available during my care of the patient were reviewed by me and considered in my medical decision making (see chart for details).        Reporting to the ED with subsequent visit regarding upper abdominal pain with nausea and vomiting.  Patient was admitted 2 days ago for similar symptoms with AKI and negative CT imaging, felt to be due to cannabis induced hyperemesis.  He was discharged yesterday with improving kidney function.  He presents today with recurrent symptoms that began last night though more mild than 2 days ago.  On exam he appears to be significantly less distressed than initial evaluation 2 days ago.  He has generalized tenderness to the abdomen without peritoneal signs.  Vital signs are stable, afebrile.  Repeat lab work today reveals improved creatinine from yesterday.  Persistent leukocytosis is present.  Symptoms treated in the ED with IV medications with improvement in symptoms.  He is most concerned about managing his symptoms at home.  Strongly encourage patient avoid any marijuana use.  Will prescribe Prilosec for antacid, Zofran, and Bentyl for abdominal cramping.  Patient is agreeable to discharge at this time.  Discussed results, findings, treatment and follow up. Patient advised of return precautions. Patient verbalized understanding and agreed with plan.   Final Clinical Impressions(s) / ED Diagnoses   Final diagnoses:  Non-intractable vomiting with nausea, unspecified vomiting type  Chronic abdominal pain    ED Discharge Orders         Ordered    omeprazole (PRILOSEC) 20 MG capsule  Daily     07/25/19 1426    ondansetron (ZOFRAN ODT) 4 MG disintegrating tablet  Every 8 hours PRN     07/25/19 1426    dicyclomine (BENTYL) 20 MG tablet  2 times daily PRN     07/25/19 1426            Audon Heymann, [place] N, [place] 07/25/19 1514    Eber Hong, MD 07/26/19 [phone]

## 2019-07-25 NOTE — Discharge Instructions (Addendum)
Your lab work is very reassuring today. You can take the Zofran every 8 hours as needed for nausea or vomiting. You can take the Bentyl every 12 hours for abdominal cramping. Take the omeprazole daily to help reduce the acid in your stomach.  You can also take an over-the-counter liquid Maalox for more instant relief. It is important you drink clear liquids to stay hydrated.  This will also help your constipation.  You can take a stool softener as needed for constipation. Follow-up closely with your primary care provider. It is strongly recommended you avoid marijuana as this can cause your symptoms.

## 2019-07-25 NOTE — ED Notes (Signed)
Pt given gingerale for PO challenge 

## 2019-07-25 NOTE — ED Notes (Signed)
Pt is still vomiting and unable to tolerate PO fluid. He is asking for a medication he received the other day that took his pain away. Provider notified.

## 2019-07-25 NOTE — ED Triage Notes (Addendum)
Generalized abd pain and vomiting. Recently admitted for AKI. Neg COVID test on 11/13. Reports last BM 07/19/19

## 2019-07-25 NOTE — ED Notes (Signed)
Tolerating po's well

## 2019-07-29 ENCOUNTER — Inpatient Hospital Stay (HOSPITAL_COMMUNITY)
Admission: EM | Admit: 2019-07-29 | Discharge: 2019-07-30 | DRG: 977 | Disposition: A | Discharge status: Home / Self Care | Payer: Self-pay | Attending: Internal Medicine | Admitting: Internal Medicine

## 2019-07-29 ENCOUNTER — Encounter (HOSPITAL_COMMUNITY): Payer: Self-pay | Admitting: Emergency Medicine

## 2019-07-29 ENCOUNTER — Other Ambulatory Visit: Payer: Self-pay

## 2019-07-29 DIAGNOSIS — E872 Acidosis: Secondary | ICD-10-CM | POA: Diagnosis present

## 2019-07-29 DIAGNOSIS — B20 Human immunodeficiency virus [HIV] disease: Secondary | ICD-10-CM | POA: Diagnosis present

## 2019-07-29 DIAGNOSIS — F1290 Cannabis use, unspecified, uncomplicated: Secondary | ICD-10-CM | POA: Diagnosis present

## 2019-07-29 DIAGNOSIS — E8729 Other acidosis: Secondary | ICD-10-CM | POA: Diagnosis present

## 2019-07-29 DIAGNOSIS — Z720 Tobacco use: Secondary | ICD-10-CM | POA: Diagnosis present

## 2019-07-29 DIAGNOSIS — F1011 Alcohol abuse, in remission: Secondary | ICD-10-CM | POA: Diagnosis present

## 2019-07-29 DIAGNOSIS — M6282 Rhabdomyolysis: Secondary | ICD-10-CM | POA: Diagnosis present

## 2019-07-29 DIAGNOSIS — E875 Hyperkalemia: Secondary | ICD-10-CM | POA: Diagnosis present

## 2019-07-29 DIAGNOSIS — R000 Tachycardia, unspecified: Secondary | ICD-10-CM | POA: Diagnosis present

## 2019-07-29 DIAGNOSIS — E876 Hypokalemia: Secondary | ICD-10-CM | POA: Diagnosis present

## 2019-07-29 DIAGNOSIS — Z79899 Other long term (current) drug therapy: Secondary | ICD-10-CM

## 2019-07-29 DIAGNOSIS — Z20828 Contact with and (suspected) exposure to other viral communicable diseases: Secondary | ICD-10-CM | POA: Diagnosis present

## 2019-07-29 DIAGNOSIS — J45909 Unspecified asthma, uncomplicated: Secondary | ICD-10-CM | POA: Diagnosis present

## 2019-07-29 DIAGNOSIS — R112 Nausea with vomiting, unspecified: Secondary | ICD-10-CM | POA: Diagnosis present

## 2019-07-29 DIAGNOSIS — K4090 Unilateral inguinal hernia, without obstruction or gangrene, not specified as recurrent: Secondary | ICD-10-CM | POA: Diagnosis present

## 2019-07-29 DIAGNOSIS — F1010 Alcohol abuse, uncomplicated: Secondary | ICD-10-CM | POA: Diagnosis present

## 2019-07-29 DIAGNOSIS — K859 Acute pancreatitis without necrosis or infection, unspecified: Secondary | ICD-10-CM | POA: Diagnosis present

## 2019-07-29 DIAGNOSIS — E871 Hypo-osmolality and hyponatremia: Secondary | ICD-10-CM | POA: Diagnosis present

## 2019-07-29 DIAGNOSIS — E860 Dehydration: Principal | ICD-10-CM | POA: Diagnosis present

## 2019-07-29 DIAGNOSIS — F17210 Nicotine dependence, cigarettes, uncomplicated: Secondary | ICD-10-CM | POA: Diagnosis present

## 2019-07-29 DIAGNOSIS — D72829 Elevated white blood cell count, unspecified: Secondary | ICD-10-CM | POA: Diagnosis present

## 2019-07-29 DIAGNOSIS — R1012 Left upper quadrant pain: Secondary | ICD-10-CM | POA: Diagnosis present

## 2019-07-29 DIAGNOSIS — N179 Acute kidney failure, unspecified: Secondary | ICD-10-CM | POA: Diagnosis present

## 2019-07-29 DIAGNOSIS — Z21 Asymptomatic human immunodeficiency virus [HIV] infection status: Secondary | ICD-10-CM | POA: Diagnosis present

## 2019-07-29 LAB — SARS CORONAVIRUS 2 (TAT 6-24 HRS): SARS Coronavirus 2: NEGATIVE

## 2019-07-29 LAB — URINALYSIS, ROUTINE W REFLEX MICROSCOPIC
Bilirubin Urine: NEGATIVE
Glucose, UA: NEGATIVE mg/dL
Ketones, ur: 5 mg/dL — AB
Leukocytes,Ua: NEGATIVE
Nitrite: NEGATIVE
Protein, ur: 100 mg/dL — AB
Specific Gravity, Urine: 1.02 (ref 1.005–1.030)
pH: 5 (ref 5.0–8.0)

## 2019-07-29 LAB — COMPREHENSIVE METABOLIC PANEL
ALT: 14 U/L (ref 0–44)
AST: 56 U/L — ABNORMAL HIGH (ref 15–41)
Albumin: 4.7 g/dL (ref 3.5–5.0)
Alkaline Phosphatase: 58 U/L (ref 38–126)
Anion gap: 21 — ABNORMAL HIGH (ref 5–15)
BUN: 51 mg/dL — ABNORMAL HIGH (ref 6–20)
CO2: 17 mmol/L — ABNORMAL LOW (ref 22–32)
Calcium: 9.9 mg/dL (ref 8.9–10.3)
Chloride: 87 mmol/L — ABNORMAL LOW (ref 98–111)
Creatinine, Ser: 3.08 mg/dL — ABNORMAL HIGH (ref 0.61–1.24)
GFR calc Af Amer: 29 mL/min — ABNORMAL LOW (ref >60)
GFR calc non Af Amer: 25 mL/min — ABNORMAL LOW (ref >60)
Glucose, Bld: 146 mg/dL — ABNORMAL HIGH (ref 70–99)
Potassium: 5.5 mmol/L — ABNORMAL HIGH (ref 3.5–5.1)
Sodium: 125 mmol/L — ABNORMAL LOW (ref 135–145)
Total Bilirubin: 2.8 mg/dL — ABNORMAL HIGH (ref 0.3–1.2)
Total Protein: 9.5 g/dL — ABNORMAL HIGH (ref 6.5–8.1)

## 2019-07-29 LAB — BASIC METABOLIC PANEL
Anion gap: 18 — ABNORMAL HIGH (ref 5–15)
Anion gap: 11 (ref 5–15)
BUN: 43 mg/dL — ABNORMAL HIGH (ref 6–20)
BUN: 35 mg/dL — ABNORMAL HIGH (ref 6–20)
CO2: 16 mmol/L — ABNORMAL LOW (ref 22–32)
CO2: 25 mmol/L (ref 22–32)
Calcium: 8.6 mg/dL — ABNORMAL LOW (ref 8.9–10.3)
Calcium: 8.6 mg/dL — ABNORMAL LOW (ref 8.9–10.3)
Chloride: 96 mmol/L — ABNORMAL LOW (ref 98–111)
Chloride: 91 mmol/L — ABNORMAL LOW (ref 98–111)
Creatinine, Ser: 2.01 mg/dL — ABNORMAL HIGH (ref 0.61–1.24)
Creatinine, Ser: 1.92 mg/dL — ABNORMAL HIGH (ref 0.61–1.24)
GFR calc Af Amer: 48 mL/min — ABNORMAL LOW (ref >60)
GFR calc Af Amer: 51 mL/min — ABNORMAL LOW (ref >60)
GFR calc non Af Amer: 42 mL/min — ABNORMAL LOW (ref >60)
GFR calc non Af Amer: 44 mL/min — ABNORMAL LOW (ref >60)
Glucose, Bld: 115 mg/dL — ABNORMAL HIGH (ref 70–99)
Glucose, Bld: 114 mg/dL — ABNORMAL HIGH (ref 70–99)
Potassium: 3.5 mmol/L (ref 3.5–5.1)
Potassium: 2.9 mmol/L — ABNORMAL LOW (ref 3.5–5.1)
Sodium: 130 mmol/L — ABNORMAL LOW (ref 135–145)
Sodium: 127 mmol/L — ABNORMAL LOW (ref 135–145)

## 2019-07-29 LAB — CBC WITH DIFFERENTIAL/PLATELET
Abs Immature Granulocytes: 0.28 10*3/uL — ABNORMAL HIGH (ref 0.00–0.07)
Basophils Absolute: 0.1 10*3/uL (ref 0.0–0.1)
Basophils Relative: 0 %
Eosinophils Absolute: 0 10*3/uL (ref 0.0–0.5)
Eosinophils Relative: 0 %
HCT: 57.6 % — ABNORMAL HIGH (ref 39.0–52.0)
Hemoglobin: 20.6 g/dL — ABNORMAL HIGH (ref 13.0–17.0)
Immature Granulocytes: 1 %
Lymphocytes Relative: 17 %
Lymphs Abs: 3.5 10*3/uL (ref 0.7–4.0)
MCH: 30.2 pg (ref 26.0–34.0)
MCHC: 35.8 g/dL (ref 30.0–36.0)
MCV: 84.5 fL (ref 80.0–100.0)
Monocytes Absolute: 1.3 10*3/uL — ABNORMAL HIGH (ref 0.1–1.0)
Monocytes Relative: 7 %
Neutro Abs: 15.1 10*3/uL — ABNORMAL HIGH (ref 1.7–7.7)
Neutrophils Relative %: 75 %
Platelets: 358 10*3/uL (ref 150–400)
RBC: 6.82 MIL/uL — ABNORMAL HIGH (ref 4.22–5.81)
RDW: 12.8 % (ref 11.5–15.5)
WBC: 20.3 10*3/uL — ABNORMAL HIGH (ref 4.0–10.5)
nRBC: 0 % (ref 0.0–0.2)

## 2019-07-29 LAB — LIPASE, BLOOD: Lipase: 27 U/L (ref 11–51)

## 2019-07-29 LAB — CK: Total CK: 190 U/L (ref 49–397)

## 2019-07-29 LAB — MAGNESIUM: Magnesium: 2.1 mg/dL (ref 1.7–2.4)

## 2019-07-29 LAB — LACTIC ACID, PLASMA
Lactic Acid, Venous: 1.3 mmol/L (ref 0.5–1.9)
Lactic Acid, Venous: 1.3 mmol/L (ref 0.5–1.9)

## 2019-07-29 MED ORDER — SODIUM CHLORIDE 0.9 % IV BOLUS
1000.0000 mL | Freq: Once | INTRAVENOUS | Status: AC
  Administered 2019-07-29: 1000 mL via INTRAVENOUS

## 2019-07-29 MED ORDER — PANTOPRAZOLE SODIUM 40 MG PO TBEC
40.0000 mg | DELAYED_RELEASE_TABLET | Freq: Every day | ORAL | Status: DC
  Administered 2019-07-29 – 2019-07-30 (×2): 40 mg via ORAL
  Filled 2019-07-29 (×2): qty 1

## 2019-07-29 MED ORDER — POTASSIUM CHLORIDE CRYS ER 20 MEQ PO TBCR
40.0000 meq | EXTENDED_RELEASE_TABLET | ORAL | Status: AC
  Administered 2019-07-29: 40 meq via ORAL
  Filled 2019-07-29: qty 2

## 2019-07-29 MED ORDER — ACETAMINOPHEN 325 MG PO TABS: 650.0000 mg | ORAL_TABLET | Freq: Four times a day (QID) | ORAL | Status: DC | PRN

## 2019-07-29 MED ORDER — DICYCLOMINE HCL 20 MG PO TABS
20.0000 mg | ORAL_TABLET | Freq: Two times a day (BID) | ORAL | Status: DC | PRN
  Administered 2019-07-30: 20 mg via ORAL
  Filled 2019-07-29: qty 1

## 2019-07-29 MED ORDER — DROPERIDOL 2.5 MG/ML IJ SOLN
2.5000 mg | Freq: Once | INTRAMUSCULAR | Status: AC
  Administered 2019-07-29: 2.5 mg via INTRAVENOUS
  Filled 2019-07-29: qty 2

## 2019-07-29 MED ORDER — MORPHINE SULFATE (PF) 4 MG/ML IV SOLN
4.0000 mg | Freq: Once | INTRAVENOUS | Status: AC
  Administered 2019-07-29: 4 mg via INTRAVENOUS
  Filled 2019-07-29: qty 1

## 2019-07-29 MED ORDER — CALCIUM GLUCONATE-NACL 1-0.675 GM/50ML-% IV SOLN
1.0000 g | Freq: Once | INTRAVENOUS | Status: AC
  Administered 2019-07-29: 1000 mg via INTRAVENOUS
  Filled 2019-07-29: qty 50

## 2019-07-29 MED ORDER — METOCLOPRAMIDE HCL 10 MG PO TABS
10.0000 mg | ORAL_TABLET | Freq: Four times a day (QID) | ORAL | Status: DC | PRN
  Administered 2019-07-30: 10 mg via ORAL
  Filled 2019-07-29: qty 1

## 2019-07-29 MED ORDER — SODIUM CHLORIDE 0.9% FLUSH
3.0000 mL | Freq: Two times a day (BID) | INTRAVENOUS | Status: DC
  Administered 2019-07-29: 3 mL via INTRAVENOUS

## 2019-07-29 MED ORDER — BICTEGRAVIR-EMTRICITAB-TENOFOV 50-200-25 MG PO TABS
1.0000 | ORAL_TABLET | Freq: Every day | ORAL | Status: DC
  Administered 2019-07-29: 1 via ORAL
  Filled 2019-07-29: qty 1

## 2019-07-29 MED ORDER — LIDOCAINE VISCOUS HCL 2 % MT SOLN
15.0000 mL | Freq: Once | OROMUCOSAL | Status: AC
  Administered 2019-07-29: 15 mL via ORAL
  Filled 2019-07-29: qty 15

## 2019-07-29 MED ORDER — ALUM & MAG HYDROXIDE-SIMETH 200-200-20 MG/5ML PO SUSP
30.0000 mL | Freq: Once | ORAL | Status: AC
  Administered 2019-07-29: 30 mL via ORAL
  Filled 2019-07-29: qty 30

## 2019-07-29 MED ORDER — SODIUM CHLORIDE 0.9 % IV SOLN
Freq: Once | INTRAVENOUS | Status: AC
  Administered 2019-07-29: 10:00:00 via INTRAVENOUS

## 2019-07-29 MED ORDER — SODIUM CHLORIDE 0.9 % IV SOLN: Freq: Once | INTRAVENOUS | Status: DC

## 2019-07-29 MED ORDER — SODIUM CHLORIDE 0.9 % IV SOLN
INTRAVENOUS | Status: DC | PRN
  Administered 2019-07-29: 250 mL via INTRAVENOUS

## 2019-07-29 MED ORDER — SODIUM CHLORIDE 0.9 % IV SOLN
INTRAVENOUS | Status: DC
  Administered 2019-07-29 – 2019-07-30 (×2): via INTRAVENOUS

## 2019-07-29 MED ORDER — ONDANSETRON HCL 4 MG/2ML IJ SOLN
4.0000 mg | Freq: Once | INTRAMUSCULAR | Status: AC
  Administered 2019-07-29: 4 mg via INTRAVENOUS
  Filled 2019-07-29: qty 2

## 2019-07-29 MED ORDER — SODIUM CHLORIDE 0.9 % IV SOLN: INTRAVENOUS | Status: DC

## 2019-07-29 MED ORDER — CAPSAICIN 0.025 % EX CREA
TOPICAL_CREAM | Freq: Two times a day (BID) | CUTANEOUS | Status: DC
  Administered 2019-07-29 – 2019-07-30 (×2): via TOPICAL
  Filled 2019-07-29: qty 60

## 2019-07-29 MED ORDER — FAMOTIDINE IN NACL 20-0.9 MG/50ML-% IV SOLN
20.0000 mg | Freq: Once | INTRAVENOUS | Status: AC
  Administered 2019-07-29: 20 mg via INTRAVENOUS
  Filled 2019-07-29: qty 50

## 2019-07-29 MED ORDER — ABACAVIR-DOLUTEGRAVIR-LAMIVUD 600-50-300 MG PO TABS: 1.0000 | ORAL_TABLET | Freq: Every day | ORAL | Status: DC

## 2019-07-29 MED ORDER — BICTEGRAVIR-EMTRICITAB-TENOFOV 50-200-25 MG PO TABS
1.0000 | ORAL_TABLET | Freq: Every day | ORAL | Status: DC
  Administered 2019-07-30: 1 via ORAL
  Filled 2019-07-29: qty 1

## 2019-07-29 MED ORDER — HEPARIN SODIUM (PORCINE) 5000 UNIT/ML IJ SOLN
5000.0000 [IU] | Freq: Three times a day (TID) | INTRAMUSCULAR | Status: DC
  Administered 2019-07-29 – 2019-07-30 (×2): 5000 [IU] via SUBCUTANEOUS
  Filled 2019-07-29 (×3): qty 1

## 2019-07-29 MED ORDER — DIPHENHYDRAMINE HCL 50 MG/ML IJ SOLN
25.0000 mg | Freq: Once | INTRAMUSCULAR | Status: AC
  Administered 2019-07-29: 25 mg via INTRAVENOUS
  Filled 2019-07-29: qty 1

## 2019-07-29 MED ORDER — ACETAMINOPHEN 650 MG RE SUPP: 650.0000 mg | Freq: Four times a day (QID) | RECTAL | Status: DC | PRN

## 2019-07-29 NOTE — Plan of Care (Signed)
  Problem: Pain Managment: Goal: General experience of comfort will improve Outcome: Progressing   

## 2019-07-29 NOTE — H&P (Addendum)
History and Physical    Carlos Guzman ZOX:[id number] DOB: Mar 21, 1986 DOA: 07/29/2019  Referring MD/NP/PA: Melene Plan, MD PCP: Patient, No Pcp Per  Patient coming from: Home  Chief Complaint: Left upper quadrant abdominal pain  I have personally briefly reviewed patient's old medical records in [facility]   HPI: Carlos Guzman is a 33 y.o. male with medical history significant of HIV on HARRT, pancreatitis, tobacco use, alcohol abuse, and marijuana use.  He presents with complaints of left upper quadrant abdominal pain with nausea and vomiting for the last week.  He was just recently hospitalized from 11/13-11/14 at [facility] for dehydration, AKI, and suspected cannabinoid hyperemesis syndrome.  At the time of his discharge home he felt that he was not ready.  He was unable to get any of the medications that were prescribed due to the cost.  Over the last week he again developed nausea and vomiting.  Emesis was noted to be nonbloody in appearance.  Notes associated symptoms of generalized malaise, weakness, and decreased urine output.  Denies having any significant fever, cough, shortness of breath, dysuria, or diarrhea.  He admits to smoking 1 ppd of cigarettes and usually at least 1 blunt of marijuana.  Denies any alcohol use in the last 4 weeks due to him previously being told that it was causing gastritis.  Patient actually was supposed to follow-up with his factious disease doctor at [facility] this week.  ED Course: Upon admission into the emergency department patient was noted to be tachycardic and tachypneic with all other vital signs within normal limits.  Labs significant for WBC 20.3, hemoglobin 20.6, sodium 125, potassium 5.5, chloride 87, CO2 17, BUN 51,creatinine 3.08, lipase 27, AST 56, and total bilirubin 2.8.  Lactic acid was pending.  Patient was given Pepcid IV, morphine 4 mg IV, Zofran, GI cocktail, Benadryl, and droperidol 2.5 mg IV while in the  ED. Covid testing was negative.  Suspected persistent nausea and vomiting secondary to possible cannabinoid hyperemesis.  He has had no subsequent episodes of vomiting since he received the medicines given to him in the hospital  Review of Systems  Constitutional: Positive for diaphoresis and malaise/fatigue. Negative for fever.  HENT: Negative for congestion and nosebleeds.   Eyes: Negative for photophobia and pain.  Respiratory: Negative for cough and shortness of breath.   Cardiovascular: Negative for chest pain and leg swelling.  Gastrointestinal: Positive for abdominal pain, nausea and vomiting. Negative for diarrhea.  Genitourinary: Negative for dysuria and frequency.  Musculoskeletal: Negative for falls.  Skin: Negative for rash.  Neurological: Positive for weakness. Negative for loss of consciousness.  Psychiatric/Behavioral: Positive for substance abuse.    Past Medical History:  Diagnosis Date   Asthma    HIV (human immunodeficiency virus infection) (HCC)    Pancreatitis     History reviewed. No pertinent surgical history.   reports that he has been smoking cigarettes. He has been smoking about 1.00 pack per day. He has never used smokeless tobacco. He reports previous alcohol use. He reports current drug use. Drugs: Cocaine and Marijuana.  No Known Allergies  Family History  Problem Relation Age of Onset   CAD Neg Hx    Diabetes Neg Hx     Prior to Admission medications   Medication Sig Start Date End Date Taking? Authorizing Provider  abacavir-dolutegravir-lamiVUDine (TRIUMEQ) 600-50-300 MG tablet Take 1 tablet by mouth daily. 10/02/17   [provider]  bictegravir-emtricitabine-tenofovir AF (BIKTARVY) 50-200-25 MG TABS  tablet Take 1 tablet by mouth daily.  04/03/19   [provider]  dicyclomine (BENTYL) 20 MG tablet Take 1 tablet (20 mg total) by mouth 2 (two) times daily as needed (abdominal cramping). 07/25/19   Robinson, [place] N, PA-C    metoCLOPramide (REGLAN) 10 MG tablet Take 1 tablet (10 mg total) by mouth every 6 (six) hours as needed for nausea or vomiting. 06/10/19   Molpus, John, MD  omeprazole (PRILOSEC) 20 MG capsule Take 1 capsule (20 mg total) by mouth daily. 07/25/19   Robinson, [place] N, PA-C  ondansetron (ZOFRAN ODT) 4 MG disintegrating tablet Take 1 tablet (4 mg total) by mouth every 8 (eight) hours as needed for nausea or vomiting. 07/25/19   Robinson, [place] N, PA-C  pantoprazole (PROTONIX) 20 MG tablet Take 1 tablet (20 mg total) by mouth daily. 06/08/19   Tegeler, Canary Brimhristopher J, MD  famotidine (PEPCID) 20 MG tablet Take 1 tablet (20 mg total) by mouth 2 (two) times daily. 10/11/17 06/10/19  Elson AreasSofia, Leslie K, PA-C  sucralfate (CARAFATE) 1 g tablet Take 1 tablet (1 g total) by mouth 4 (four) times daily -  with meals and at bedtime. 06/10/19 06/10/19  Molpus, John, MD    Physical Exam:  Constitutional: Male who appears to be in no acute distress at this time. Vitals:   07/29/19 0809 07/29/19 0830 07/29/19 0915  BP: (!) 136/111 101/82 138/83  Pulse: (!) 135 (!) 116 91  Resp: 18 (!) 23 14  Temp: 97.7 F (36.5 C)    TempSrc: Oral    SpO2: 100% 100% 100%  Weight: 78.9 kg    Height: 6\' 1"  (1.854 m)     Eyes: PERRL, lids and conjunctivae normal ENMT: Mucous membranes are dry. Posterior pharynx clear of any exudate or lesions.Normal dentition.  Neck: normal, supple, no masses, no thyromegaly Respiratory: clear to auscultation bilaterally, no wheezing, no crackles. Normal respiratory effort. No accessory muscle use.  Cardiovascular: Tachycardic, no murmurs / rubs / gallops. No extremity edema. 2+ pedal pulses. No carotid bruits.  Abdomen: no tenderness, no masses palpated. No hepatosplenomegaly. Bowel sounds positive.  Musculoskeletal: no clubbing / cyanosis. No joint deformity upper and lower extremities. Good ROM, no contractures. Normal muscle tone.  Skin: Diaphoretic, no rashes, lesions, ulcers. No  induration Neurologic: CN 2-12 grossly intact. Sensation intact, DTR normal. Strength 5/5 in all 4.  Psychiatric: Normal judgment and insight. Alert and oriented x 3. Normal mood.     Labs on Admission: I have personally reviewed following labs and imaging studies  CBC: Recent Labs  Lab 07/23/19 1556 07/24/19 0040 07/25/19 1134 07/29/19 0825  WBC 22.4* 17.6* 19.9* 20.3*  NEUTROABS 18.2*  --  17.4* 15.1*  HGB 18.7* 15.7 17.1* 20.6*  HCT 54.5* 46.2 50.5 57.6*  MCV 87.9 90.4 89.2 84.5  PLT 316 257 281 358   Basic Metabolic Panel: Recent Labs  Lab 07/23/19 1556 07/24/19 0040 07/25/19 1134 07/29/19 0825  NA 132* 132* 136 125*  K 3.4* 3.7 3.6 5.5*  CL 96* 103 105 87*  CO2 20* 21* 22 17*  GLUCOSE 131* 135* 140* 146*  BUN 31* 22* 13 51*  CREATININE 2.40* 1.48* 1.22 3.08*  CALCIUM 10.3 8.6* 9.7 9.9  MG  --  1.7  --   --   PHOS  --  2.7  --   --    GFR: Estimated Creatinine Clearance: 37.4 mL/min (A) (by C-G formula based on SCr of 3.08 mg/dL (H)). Liver Function Tests: Recent  Labs  Lab 07/23/19 1556 07/24/19 0040 07/25/19 1134 07/29/19 0825  AST 33 32 31 56*  ALT 20 18 24 14   ALKPHOS 67 47 54 58  BILITOT 1.1 1.2 1.1 2.8*  PROT 10.3* 7.4 8.4* 9.5*  ALBUMIN 5.2* 3.8 4.3 4.7   Recent Labs  Lab 07/23/19 1556 07/29/19 0825  LIPASE 26 27   No results for input(s): AMMONIA in the last 168 hours. Coagulation Profile: No results for input(s): INR, PROTIME in the last 168 hours. Cardiac Enzymes: Recent Labs  Lab 07/24/19 0040  CKTOTAL 990*   BNP (last 3 results) No results for input(s): PROBNP in the last 8760 hours. HbA1C: No results for input(s): HGBA1C in the last 72 hours. CBG: No results for input(s): GLUCAP in the last 168 hours. Lipid Profile: No results for input(s): CHOL, HDL, LDLCALC, TRIG, CHOLHDL, LDLDIRECT in the last 72 hours. Thyroid Function Tests: No results for input(s): TSH, T4TOTAL, FREET4, T3FREE, THYROIDAB in the last 72  hours. Anemia Panel: No results for input(s): VITAMINB12, FOLATE, FERRITIN, TIBC, IRON, RETICCTPCT in the last 72 hours. Urine analysis:    Component Value Date/Time   COLORURINE YELLOW 07/23/2019 2036   APPEARANCEUR CLEAR 07/23/2019 2036   APPEARANCEUR Clear 11/24/2013 1333   LABSPEC 1.025 07/23/2019 2036   LABSPEC 1.030 11/24/2013 1333   PHURINE 6.0 07/23/2019 2036   GLUCOSEU NEGATIVE 07/23/2019 2036   GLUCOSEU Negative 11/24/2013 1333   HGBUR SMALL (A) 07/23/2019 2036   BILIRUBINUR NEGATIVE 07/23/2019 2036   BILIRUBINUR 1+ 11/24/2013 1333   KETONESUR NEGATIVE 07/23/2019 2036   PROTEINUR 100 (A) 07/23/2019 2036   UROBILINOGEN 1.0 01/04/2015 1651   NITRITE NEGATIVE 07/23/2019 2036   LEUKOCYTESUR NEGATIVE 07/23/2019 2036   LEUKOCYTESUR Negative 11/24/2013 1333   Sepsis Labs: Recent Results (from the past 240 hour(s))  SARS CORONAVIRUS 2 (TAT 6-24 HRS) Nasopharyngeal Nasopharyngeal Swab     Status: None   Collection Time: 07/23/19  6:15 PM   Specimen: Nasopharyngeal Swab  Result Value Ref Range Status   SARS Coronavirus 2 NEGATIVE NEGATIVE Final    Comment: (NOTE) SARS-CoV-2 target nucleic acids are NOT DETECTED. The SARS-CoV-2 RNA is generally detectable in upper and lower respiratory specimens during the acute phase of infection. Negative results do not preclude SARS-CoV-2 infection, do not rule out co-infections with other pathogens, and should not be used as the sole basis for treatment or other patient management decisions. Negative results must be combined with clinical observations, patient history, and epidemiological information. The expected result is Negative. Fact Sheet for Patients: 07/25/19 Fact Sheet for Healthcare Providers: HairSlick.no This test is not yet approved or cleared by the quierodirigir.com FDA and  has been authorized for detection and/or diagnosis of SARS-CoV-2 by FDA under an  Emergency Use Authorization (EUA). This EUA will remain  in effect (meaning this test can be used) for the duration of the COVID-19 declaration under Section 56 4(b)(1) of the Act, 21 U.S.C. section 360bbb-3(b)(1), unless the authorization is terminated or revoked sooner. Performed at [facility], [address], [place], [place]      Radiological Exams on Admission: No results found.  EKG: Independently reviewed.  Sinus tachycardia 110 bpm  Assessment/Plan SIRS, chronic leukocytosis: Patient presented with tachycardia and tachypnea.  WBC elevated at 20.3, but appears to be more so chronic in nature.  Patient was afebrile and lactic acid was reassuring at 1.3.  Urinalysis did not show clear signs of infection. -Admit to a medical telemetry bed -  Monitoring off antibiotics at this time   Suspected cannabinoid hyperemesis/intractable nausea and vomiting: Acute on chronic.  Patient reports normally smoking 1 blunt of marijuana per day on average.  Still suspecting cannabinoid hyperemesis syndrome as a likely cause of symptoms.. -Strict intake and output -Monitor intake and output -Antiemetics as needed -Capsaicin gel applied to abdomen is off label use for treatment of cannabinoid hyperemesis.  Patient should be given cream and discharge as he is having difficulty obtaining medications due to cost. -Care management consulted for medication needs  Acute renal failure secondary to dehydration: Patient baseline creatinine at discharge was 1.22, but he presents with creatinine up to 3.08 with BUN 51.  Patient has been ordered 1 normal saline IV fluids. -Check CK -Given additional 1 L normal saline IV fluids, then placed on a rate 125 mL/h -Continue to monitor kidney function and output  Left upper quadrant abdominal pain: Unclear cause of symptoms.  Lipase was noted to be within normal limits.  Previous CT scan of the abdomen pelvis from 11/13 showed no acute  abnormalities. -Continue to monitor  Hyperkalemia: Acute.  Potassium 5.5 on admission.  -Continue IV fluids as seen above  Hyponatremia: Acute.  On admission sodium back down to 125.  Suspect secondary to reports of nausea and vomiting.  He had been ordered total of 3 L normal saline IV fluids. -Normal saline IV fluids 125 mL/h -Serial BMPs every 4 hours -Adjust IV fluids as needed  Metabolic acidosis with elevated anion YWV:PXTGGYI secondary to ketosis. -Continue to monitor  HIV: Followed at infectious disease the [facility].  Patient on HARRT. -Continue current home medication regimen -Continue outpatient follow-up at [facility]  Alcohol abuse, in remission: Patient reports last drink reportedly 4 weeks ago. -Continue to encourage continued cessation of tobacco use.  Elevated AST and hyperbilirubinemia: Acute.  AST noted to be 56.  The AST to ALT ratio suggest alcohol abuse.  Total bilirubin also elevated at 2.8. -Recheck CMP in a.m.  Right inguinal hernia: Incidental finding on CT scan of the abdomen pelvis from 11/13. -Recommend outpatient follow-up with general surgery  DVT prophylaxis: Heparin Code Status: Full Family Communication: No family present at bedside Disposition Plan: Possibly discharge home in 1 to 2 days Consults called: None Admission status: Inpatient requiring more than 2 midnight stay due to kidney function and need of aggressive IV fluid rehydration  Norval Morton MD [facility] Pager [phone]   If 7PM-7AM, please contact night-coverage [email or web address] Password [email or web address]  07/29/2019, 10:15 AM

## 2019-07-29 NOTE — ED Triage Notes (Signed)
Pt reports ongoing emesis and burning abdominal pain for the last 2 weeks. States seen here last week and unable to get scripts filled because he didn't have the money. Denies recent fevers. Pt alert, oriented x4

## 2019-07-29 NOTE — ED Provider Notes (Signed)
[facility] EMERGENCY DEPARTMENT Provider Note   CSN: [id number] Arrival date & time: 07/29/19  0806     History   Chief Complaint Chief Complaint  Patient presents with  . Emesis  . Abdominal Pain    HPI Carlos Guzman is a 33 y.o. male.     33 yo M with a chief complaints of burning abdominal pain.  This been going on for about a week now.  Was seen initially and put into the hospital.  Thought to be due to cannabinoid hyperemesis syndrome.  Was discharged home but had persistent symptoms.  Seen in the ED a few days ago for the same.  His renal dysfunction had improved and was tolerating p.o. and so was discharged home.  Since then he felt that his symptoms have persisted.  He is able to tolerate grits and water, has been unable to tolerate soda or other solid food.  Has had persistent pain.  Was unable to get prescriptions filled from his last visit.  The history is provided by the patient.  Emesis Associated symptoms: abdominal pain   Associated symptoms: no arthralgias, no chills, no diarrhea, no fever, no headaches and no myalgias   Abdominal Pain Associated symptoms: nausea and vomiting   Associated symptoms: no chest pain, no chills, no diarrhea, no fever and no shortness of breath   Illness Severity:  Moderate Onset quality:  Gradual Duration:  1 week Timing:  Constant Progression:  Improving Chronicity:  Recurrent Associated symptoms: abdominal pain, nausea and vomiting   Associated symptoms: no chest pain, no congestion, no diarrhea, no fever, no headaches, no myalgias, no rash and no shortness of breath     Past Medical History:  Diagnosis Date  . Asthma   . HIV (human immunodeficiency virus infection) ([place])   . Pancreatitis     Patient Active Problem List   Diagnosis Date Noted  . Rhabdomyolysis 07/24/2019  . AKI (acute kidney injury) ([place]) 07/23/2019  . HIV (human immunodeficiency virus infection) ([place]) 07/23/2019  . Cannabinoid  hyperemesis syndrome 07/23/2019  . Hypokalemia 07/23/2019  . Leukocytosis 07/23/2019  . Tobacco abuse 07/23/2019  . 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 No pertinent surgical history.      Home Medications    Prior to Admission medications   Medication Sig Start Date End Date Taking? Authorizing Provider  abacavir-dolutegravir-lamiVUDine (TRIUMEQ) 600-50-300 MG tablet Take 1 tablet by mouth daily. 10/02/17   [provider]  bictegravir-emtricitabine-tenofovir AF (BIKTARVY) 50-200-25 MG TABS tablet Take 1 tablet by mouth daily.  04/03/19   [provider]  dicyclomine (BENTYL) 20 MG tablet Take 1 tablet (20 mg total) by mouth 2 (two) times daily as needed (abdominal cramping). 07/25/19   Robinson, [place] N, PA-C  metoCLOPramide (REGLAN) 10 MG tablet Take 1 tablet (10 mg total) by mouth every 6 (six) hours as needed for nausea or vomiting. 06/10/19   Molpus, John, MD  omeprazole (PRILOSEC) 20 MG capsule Take 1 capsule (20 mg total) by mouth daily. 07/25/19   Robinson, [place] N, PA-C  ondansetron (ZOFRAN ODT) 4 MG disintegrating tablet Take 1 tablet (4 mg total) by mouth every 8 (eight) hours as needed for nausea or vomiting. 07/25/19   Robinson, [place] N, PA-C  pantoprazole (PROTONIX) 20 MG tablet Take 1 tablet (20 mg total) by mouth daily. 06/08/19   Tegeler, Gwenyth Allegra, MD  famotidine (PEPCID) 20 MG tablet Take 1 tablet (20 mg total) by mouth 2 (two) times  daily. 10/11/17 06/10/19  Elson AreasSofia, Leslie K, PA-C  sucralfate (CARAFATE) 1 g tablet Take 1 tablet (1 g total) by mouth 4 (four) times daily -  with meals and at bedtime. 06/10/19 06/10/19  Molpus, John, MD    Family History Family History  Problem Relation Age of Onset  . CAD Neg Hx   . Diabetes Neg Hx     Social History Social History   Tobacco Use  . Smoking status: Current Every Day Smoker    Packs/day: 1.00    Types: Cigarettes  . Smokeless tobacco: Never Used  Substance Use Topics  .  Alcohol use: Not Currently  . Drug use: Yes    Types: Cocaine, Marijuana    Comment: pt reports use on 10/10/17     Allergies   Patient has no known allergies.   Review of Systems Review of Systems  Constitutional: Negative for chills and fever.  HENT: Negative for congestion and facial swelling.   Eyes: Negative for discharge and visual disturbance.  Respiratory: Negative for shortness of breath.   Cardiovascular: Negative for chest pain and palpitations.  Gastrointestinal: Positive for abdominal pain, nausea and vomiting. Negative for diarrhea.  Musculoskeletal: Negative for arthralgias and myalgias.  Skin: Negative for color change and rash.  Neurological: Negative for tremors, syncope and headaches.  Psychiatric/Behavioral: Negative for confusion and dysphoric mood.     Physical Exam Updated Vital Signs BP (!) 155/88   Pulse 90   Temp 97.7 F (36.5 C) (Oral)   Resp 18   Ht 6\' 1"  (1.854 m)   Wt 78.9 kg   SpO2 99%   BMI 22.96 kg/m   Physical Exam Vitals signs and nursing note reviewed.  Constitutional:      Appearance: He is well-developed.  HENT:     Head: Normocephalic and atraumatic.  Eyes:     Pupils: Pupils are equal, round, and reactive to light.  Neck:     Musculoskeletal: Normal range of motion and neck supple.     Vascular: No JVD.  Cardiovascular:     Rate and Rhythm: Regular rhythm. Tachycardia present.     Heart sounds: No murmur. No friction rub. No gallop.   Pulmonary:     Effort: No respiratory distress.     Breath sounds: No wheezing.  Abdominal:     General: There is no distension.     Tenderness: There is abdominal tenderness (diffuse, distractable). There is no guarding or rebound.  Musculoskeletal: Normal range of motion.  Skin:    Coloration: Skin is not pale.     Findings: No rash.  Neurological:     Mental Status: He is alert and oriented to person, place, and time.  Psychiatric:        Behavior: Behavior normal.      ED  Treatments / Results  Labs (all labs ordered are listed, but only abnormal results are displayed) Labs Reviewed  CBC WITH DIFFERENTIAL/PLATELET - Abnormal; Notable for the following components:      Result Value   WBC 20.3 (*)    RBC 6.82 (*)    Hemoglobin 20.6 (*)    HCT 57.6 (*)    Neutro Abs 15.1 (*)    Monocytes Absolute 1.3 (*)    Abs Immature Granulocytes 0.28 (*)    All other components within normal limits  COMPREHENSIVE METABOLIC PANEL - Abnormal; Notable for the following components:   Sodium 125 (*)    Potassium 5.5 (*)    Chloride 87 (*)  CO2 17 (*)    Glucose, Bld 146 (*)    BUN 51 (*)    Creatinine, Ser 3.08 (*)    Total Protein 9.5 (*)    AST 56 (*)    Total Bilirubin 2.8 (*)    GFR calc non Af Amer 25 (*)    GFR calc Af Amer 29 (*)    Anion gap 21 (*)    All other components within normal limits  SARS CORONAVIRUS 2 (TAT 6-24 HRS)  LIPASE, BLOOD  LACTIC ACID, PLASMA  LACTIC ACID, PLASMA  URINALYSIS, ROUTINE W REFLEX MICROSCOPIC    EKG EKG Interpretation  Date/Time:  Thursday July 29 2019 08:36:07 EST Ventricular Rate:  110 PR Interval:    QRS Duration: 87 QT Interval:  320 QTC Calculation: 433 R Axis:   83 Text Interpretation: Sinus or ectopic atrial tachycardia Probable left atrial enlargement Left ventricular hypertrophy Since last tracing rate faster st changes likely rate related Otherwise no significant change Confirmed by Melene Plan [phone]) on 07/29/2019 8:44:29 AM   Radiology No results found.  Procedures Procedures (including critical care time)  Medications Ordered in ED Medications  droperidol (INAPSINE) 2.5 MG/ML injection 2.5 mg (has no administration in time range)  sodium chloride 0.9 % bolus 1,000 mL (0 mLs Intravenous Stopped 07/29/19 1023)  famotidine (PEPCID) IVPB 20 mg premix (0 mg Intravenous Stopped 07/29/19 0922)  diphenhydrAMINE (BENADRYL) injection 25 mg (25 mg Intravenous Given 07/29/19 0854)  alum & mag  hydroxide-simeth (MAALOX/MYLANTA) 200-200-20 MG/5ML suspension 30 mL (30 mLs Oral Given 07/29/19 0850)    And  lidocaine (XYLOCAINE) 2 % viscous mouth solution 15 mL (15 mLs Oral Given 07/29/19 0850)  0.9 %  sodium chloride infusion ( Intravenous New Bag/Given 07/29/19 1029)     Initial Impression / Assessment and Plan / ED Course  I have reviewed the triage vital signs and the nursing notes.  Pertinent labs & imaging results that were available during my care of the patient were reviewed by me and considered in my medical decision making (see chart for details).        33 yo M with a chief complaints of persistent nausea and vomiting.  Going on for the past week.  Patient is tachycardic on arrival today as high as 130s.  We will give a bolus of IV fluids recheck lab work.  Treat nausea and pain and reassess.  Case management consulted for possible help having the patient's meds refilled.  Patient's leukocytosis is persistently elevated, mildly above what it was last time.  Neutrophilic predominance.  Patient's potassium is elevated though this is likely due to hemolysis, he has an AKI, has a acidosis with anion gap that is likely secondary to ketosis.  Still awaiting UA.  Will add a lactate.  Will discuss with the hospitalist.  The patients results and plan were reviewed and discussed.   Any x-rays performed were independently reviewed by myself.   Differential diagnosis were considered with the presenting HPI.  Medications  droperidol (INAPSINE) 2.5 MG/ML injection 2.5 mg (has no administration in time range)  sodium chloride 0.9 % bolus 1,000 mL (0 mLs Intravenous Stopped 07/29/19 1023)  famotidine (PEPCID) IVPB 20 mg premix (0 mg Intravenous Stopped 07/29/19 0922)  diphenhydrAMINE (BENADRYL) injection 25 mg (25 mg Intravenous Given 07/29/19 0854)  alum & mag hydroxide-simeth (MAALOX/MYLANTA) 200-200-20 MG/5ML suspension 30 mL (30 mLs Oral Given 07/29/19 0850)    And  lidocaine  (XYLOCAINE) 2 % viscous mouth solution 15 mL (  15 mLs Oral Given 07/29/19 0850)  0.9 %  sodium chloride infusion ( Intravenous New Bag/Given 07/29/19 1029)    Vitals:   07/29/19 0809 07/29/19 0830 07/29/19 0915 07/29/19 1015  BP: (!) 136/111 101/82 138/83 (!) 155/88  Pulse: (!) 135 (!) 116 91 90  Resp: 18 (!) 23 14 18   Temp: 97.7 F (36.5 C)     TempSrc: Oral     SpO2: 100% 100% 100% 99%  Weight: 78.9 kg     Height: 6\' 1"  (1.854 m)       Final diagnoses:  AKI (acute kidney injury) (HCC)  Metabolic acidosis, increased anion gap (IAG)  Nausea and vomiting in adult    Admission/ observation were discussed with the admitting physician, patient and/or family and they are comfortable with the plan.    Final Clinical Impressions(s) / ED Diagnoses   Final diagnoses:  AKI (acute kidney injury) (HCC)  Metabolic acidosis, increased anion gap (IAG)  Nausea and vomiting in adult    ED Discharge Orders    None       , DO 07/29/19 1039

## 2019-07-30 LAB — CBC
HCT: 43.9 % (ref 39.0–52.0)
Hemoglobin: 15.5 g/dL (ref 13.0–17.0)
MCH: 30.5 pg (ref 26.0–34.0)
MCHC: 35.3 g/dL (ref 30.0–36.0)
MCV: 86.2 fL (ref 80.0–100.0)
Platelets: 253 10*3/uL (ref 150–400)
RBC: 5.09 MIL/uL (ref 4.22–5.81)
RDW: 12.2 % (ref 11.5–15.5)
WBC: 12.5 10*3/uL — ABNORMAL HIGH (ref 4.0–10.5)
nRBC: 0 % (ref 0.0–0.2)

## 2019-07-30 LAB — COMPREHENSIVE METABOLIC PANEL
ALT: 16 U/L (ref 0–44)
AST: 18 U/L (ref 15–41)
Albumin: 3.3 g/dL — ABNORMAL LOW (ref 3.5–5.0)
Alkaline Phosphatase: 43 U/L (ref 38–126)
Anion gap: 12 (ref 5–15)
BUN: 26 mg/dL — ABNORMAL HIGH (ref 6–20)
CO2: 23 mmol/L (ref 22–32)
Calcium: 8.6 mg/dL — ABNORMAL LOW (ref 8.9–10.3)
Chloride: 96 mmol/L — ABNORMAL LOW (ref 98–111)
Creatinine, Ser: 1.51 mg/dL — ABNORMAL HIGH (ref 0.61–1.24)
GFR calc Af Amer: >60 mL/min (ref >60)
GFR calc non Af Amer: 59 mL/min — ABNORMAL LOW (ref >60)
Glucose, Bld: 108 mg/dL — ABNORMAL HIGH (ref 70–99)
Potassium: 2.9 mmol/L — ABNORMAL LOW (ref 3.5–5.1)
Sodium: 131 mmol/L — ABNORMAL LOW (ref 135–145)
Total Bilirubin: 1.4 mg/dL — ABNORMAL HIGH (ref 0.3–1.2)
Total Protein: 6.6 g/dL (ref 6.5–8.1)

## 2019-07-30 MED ORDER — ONDANSETRON 4 MG PO TBDP: 4.0000 mg | ORAL_TABLET | Freq: Three times a day (TID) | ORAL | 0 refills | Status: DC | PRN

## 2019-07-30 MED ORDER — CAPSAICIN 0.025 % EX CREA: TOPICAL_CREAM | Freq: Two times a day (BID) | CUTANEOUS | 0 refills | Status: DC

## 2019-07-30 MED ORDER — POTASSIUM CHLORIDE CRYS ER 20 MEQ PO TBCR
40.0000 meq | EXTENDED_RELEASE_TABLET | ORAL | Status: AC
  Administered 2019-07-30 (×2): 40 meq via ORAL
  Filled 2019-07-30 (×2): qty 2

## 2019-07-30 NOTE — Progress Notes (Signed)
Carlos Guzman to be discharged home per MD order. Discussed prescriptions and follow up appointments with the patient. Prescriptions given to patient; medication list explained in detail. Patient verbalized understanding.  Skin clean, dry and intact without evidence of skin break down, no evidence of skin tears noted. IV catheter discontinued intact. Site without signs and symptoms of complications. Dressing and pressure applied. Pt denies pain at the site currently. No complaints noted.  Patient free of lines, drains, and wounds.   An After Visit Summary (AVS) was printed and given to the patient. Patient escorted via wheelchair, and discharged home via private auto.  Baldo Ash, RN

## 2019-07-30 NOTE — Discharge Instructions (Signed)
Cannabinoid Hyperemesis Syndrome °Cannabinoid hyperemesis syndrome (CHS) is a condition that causes repeated nausea, vomiting, and abdominal pain after long-term (chronic) use of marijuana (cannabis). People with CHS typically use marijuana 3-5 times a day for many years before they have symptoms, although it is possible to develop CHS with as little as 1 use per day. °Symptoms of CHS may be mild at first but can get worse and more frequent. In some cases, CHS may cause vomiting many times a day, which can lead to weight loss and dehydration. CHS may go away and come back many times (recur). People may not have symptoms or may otherwise be healthy in between CHS attacks. °What are the causes? °The exact cause of this condition is not known. Long-term use of marijuana may over-stimulate certain proteins in the brain that react with chemicals in marijuana (cannabinoid receptors). This over-stimulation may cause CHS. °What are the signs or symptoms? °Symptoms of this condition are often mild during the first few attacks, but they can get worse over time. Symptoms may include: °· Frequent nausea, especially early in the morning. °· Vomiting. °· Abdominal pain. °Taking several hot showers throughout the day can also be a sign of this condition. People with CHS may do this because it relieves symptoms. °How is this diagnosed? °This condition may be diagnosed based on: °· Your symptoms and medical history, including any drug use. °· A physical exam. °You may have tests done to rule out other problems. These tests may include: °· Blood tests. °· Urine tests. °· Imaging tests, such as an X-ray or CT scan. °How is this treated? °Treatment for this condition involves stopping marijuana use. Your health care provider may recommend: °· A drug rehabilitation program, if you have trouble stopping marijuana use. °· Medicines for nausea. °· Hot showers to help relieve symptoms. °Certain creams that contain a substance called  capsaicin may improve symptoms when applied to the abdomen. Ask your health care provider before starting any medicines or other treatments. °Severe nausea and vomiting may require you to stay at the hospital. You may need IV fluids to prevent or treat dehydration. You may also need certain medicines that must be given at the hospital. °Follow these instructions at home: °During an attack ° °· Stay in bed and rest in a dark, quiet room. °· Take anti-nausea medicine as told by your health care provider. °· Try taking hot showers to relieve your symptoms. °After an attack °· Drink small amounts of clear fluids slowly. Gradually add more. °· Once you are able to eat without vomiting, eat soft foods in small amounts every 3-4 hours. °General instructions ° °· Do not use any products that contain marijuana.If you need help quitting, ask your health care provider for resources and treatment options. °· Drink enough fluid to keep your urine pale yellow. Avoid drinking fluids that have a lot of sugar or caffeine, such as coffee and soda. °· Take and apply over-the-counter and prescription medicines only as told by your health care provider. Ask your health care provider before starting any new medicines or treatments. °· Keep all follow-up visits as told by your health care provider. This is important. °Contact a health care provider if: °· Your symptoms get worse. °· You cannot drink fluids without vomiting. °· You have pain and trouble swallowing after an attack. °Get help right away if: °· You cannot stop vomiting. °· You have blood in your vomit or your vomit looks like coffee grounds. °· You have   severe abdominal pain. °· You have stools that are bloody or black, or stools that look like tar. °· You have symptoms of dehydration, such as: °? Sunken eyes. °? Inability to make tears. °? Cracked lips. °? Dry mouth. °? Decreased urine production. °? Weakness. °? Sleepiness. °? Fainting. °Summary °· Cannabinoid hyperemesis  syndrome (CHS) is a condition that causes repeated nausea, vomiting, and abdominal pain after long-term use of marijuana. °· People with CHS typically use marijuana 3-5 times a day for many years before they have symptoms, although it is possible to develop CHS with as little as 1 use per day. °· Treatment for this condition involves stopping marijuana use. Hot showers and capsaicin creams may also help relieve symptoms. Ask your health care provider before starting any medicines or other treatments. °· Your health care provider may prescribe medicines to help with nausea. °· Get help right away if you have signs of dehydration, such as dry mouth, decreased urine production, or weakness. °This information is not intended to replace advice given to you by your health care provider. Make sure you discuss any questions you have with your health care provider. °Document Released: 12/04/2016 Document Revised: 01/02/2018 Document Reviewed: 12/04/2016 °[employer] Patient Education © 2020 [employer] ° °

## 2019-07-30 NOTE — Discharge Summary (Signed)
Physician Discharge Summary  Carlos Guzman ZOX:[id number] DOB: 1986/06/22 DOA: 07/29/2019  PCP: Patient, No Pcp Per  Admit date: 07/29/2019 Discharge date: 07/30/2019  Admitted From: Home Disposition:  Home   Recommendations for Outpatient Follow-up:  1. Follow up with PCP in 1 week 2. Please obtain BMP in 1 week  3. Follow up with ID at [facility] as scheduled   Discharge Condition: Stable CODE STATUS: Full  Diet recommendation: Regular diet   Brief/Interim Summary: From H&P: Carlos Guzman is a 33 y.o. male with medical history significant of HIV on HARRT, pancreatitis, tobacco use, alcohol abuse, and marijuana use.  He presents with complaints of left upper quadrant abdominal pain with nausea and vomiting for the last week.  He was just recently hospitalized from 11/13-11/14 at [facility] for dehydration, AKI, and suspected cannabinoid hyperemesis syndrome.  At the time of his discharge home he felt that he was not ready.  He was unable to get any of the medications that were prescribed due to the cost.  Over the last week he again developed nausea and vomiting.  Emesis was noted to be nonbloody in appearance.  Notes associated symptoms of generalized malaise, weakness, and decreased urine output.  Denies having any significant fever, cough, shortness of breath, dysuria, or diarrhea.  He admits to smoking 1 ppd of cigarettes and usually at least 1 blunt of marijuana.  Denies any alcohol use in the last 4 weeks due to him previously being told that it was causing gastritis.  Patient actually was supposed to follow-up with his infectious disease doctor at [facility] this week.  ED Course: Upon admission into the emergency department patient was noted to be tachycardic and tachypneic with all other vital signs within normal limits.  Labs significant for WBC 20.3, hemoglobin 20.6, sodium 125, potassium 5.5, chloride 87, CO2 17, BUN 51,creatinine 3.08, lipase  27, AST 56, and total bilirubin 2.8.  Lactic acid was pending.  Patient was given Pepcid IV, morphine 4 mg IV, Zofran, GI cocktail, Benadryl, and droperidol 2.5 mg IV while in the ED. Covid testing was negative.  Suspected persistent nausea and vomiting secondary to possible cannabinoid hyperemesis.  He has had no subsequent episodes of vomiting since he received the medicines given to him in the hospital.  This morning, he states he feels much better, able to tolerate liquids and breakfast with pain well controlled. He states he is ready to go home as long as he is able to get antiemetics for discharge.   Discharge Diagnoses:  Principal Problem:   ARF (acute renal failure) (HCC) Active Problems:   HIV (human immunodeficiency virus infection) (HCC)   Cannabinoid hyperemesis syndrome   Tobacco abuse   Alcohol abuse, in remission   Hyperkalemia   Left upper quadrant abdominal pain   Metabolic acidosis, increased anion gap   Hyperbilirubinemia   Hyponatremia   SIRS, chronic leukocytosis:  -Improved with hydration    Suspected cannabinoid hyperemesis/intractable nausea and vomiting: Acute on chronic.  Patient reports normally smoking 1 blunt of marijuana per day on average.  Still suspecting cannabinoid hyperemesis syndrome as a likely cause of symptoms.. -Improved symptoms today, tolerated PO  -Antiemetics as needed -Capsaicin gel applied to abdomen is off label use for treatment of cannabinoid hyperemesis.  Patient should be given cream and discharge as he is having difficulty obtaining medications due to cost.   Acute renal failure secondary to dehydration: Patient baseline creatinine at discharge was 1.22, but he  presents with creatinine up to 3.08 with BUN 51. -Improved with IVF. Cr 1.51 and patient tolerating PO   Hypokalemia -Replaced prior to discharge home   Left upper quadrant abdominal pain: Unclear cause of symptoms.  Lipase was noted to be within normal limits.  Previous  CT scan of the abdomen pelvis from 11/13 showed no acute abnormalities. -Resolved   Hyponatremia:  -Improved   Metabolic acidosis with elevated anion gap: -Resolved   HIV: Followed at infectious disease the [facility].  Patient on HARRT. -Continue current home medication regimen -Continue outpatient follow-up at [facility]  Alcohol abuse, in remission: Patient reports last drink reportedly 4 weeks ago. -Continue to encourage continued cessation of tobacco use.  Elevated AST and hyperbilirubinemia:  -Resolved and improving bili   Right inguinal hernia: Incidental finding on CT scan of the abdomen pelvis from 11/13. -Recommend outpatient follow-up with general surgery   Discharge Instructions  Discharge Instructions    Call MD for:  difficulty breathing, headache or visual disturbances   Complete by: As directed    Call MD for:  extreme fatigue   Complete by: As directed    Call MD for:  persistant dizziness or light-headedness   Complete by: As directed    Call MD for:  persistant nausea and vomiting   Complete by: As directed    Call MD for:  severe uncontrolled pain   Complete by: As directed    Call MD for:  temperature >100.4   Complete by: As directed    Diet general   Complete by: As directed    Discharge instructions   Complete by: As directed    You were cared for by a hospitalist during your hospital stay. If you have any questions about your discharge medications or the care you received while you were in the hospital after you are discharged, you can call the unit and ask to speak with the hospitalist on call if the hospitalist that took care of you is not available. Once you are discharged, your primary care physician will handle any further medical issues. Please note that NO REFILLS for any discharge medications will be authorized once you are discharged, as it is imperative that you return to your primary care physician (or  establish a relationship with a primary care physician if you do not have one) for your aftercare needs so that they can reassess your need for medications and monitor your lab values.   Increase activity slowly   Complete by: As directed      Allergies as of 07/30/2019   No Known Allergies     Medication List    TAKE these medications   abacavir-dolutegravir-lamiVUDine 600-50-300 MG tablet Commonly known as: TRIUMEQ Take 1 tablet by mouth daily.   bictegravir-emtricitabine-tenofovir AF 50-200-25 MG Tabs tablet Commonly known as: BIKTARVY Take 1 tablet by mouth daily.   capsaicin 0.025 % cream Commonly known as: ZOSTRIX Apply topically 2 (two) times daily.   dicyclomine 20 MG tablet Commonly known as: BENTYL Take 1 tablet (20 mg total) by mouth 2 (two) times daily as needed (abdominal cramping).   metoCLOPramide 10 MG tablet Commonly known as: Reglan Take 1 tablet (10 mg total) by mouth every 6 (six) hours as needed for nausea or vomiting.   omeprazole 20 MG capsule Commonly known as: PRILOSEC Take 1 capsule (20 mg total) by mouth daily.   ondansetron 4 MG disintegrating tablet Commonly known as: Zofran ODT Take 1 tablet (4  mg total) by mouth every 8 (eight) hours as needed for nausea or vomiting.   pantoprazole 20 MG tablet Commonly known as: PROTONIX Take 1 tablet (20 mg total) by mouth daily.      Follow-up Information    [facility]. Schedule an appointment as soon as possible for a visit in 1 week(s).   Contact information: [address] [place] [address] [phone]         No Known Allergies  Consultations:  None    Procedures/Studies: Ct Abdomen Pelvis Wo Contrast  Result Date: 07/23/2019 CLINICAL DATA:  Abdominal pain and nausea and vomiting for 2 days. Elevated white blood cell count. History of renal failure and HIV positivity. EXAM: CT ABDOMEN AND PELVIS WITHOUT CONTRAST TECHNIQUE:  Multidetector CT imaging of the abdomen and pelvis was performed following the standard protocol without IV contrast. COMPARISON:  CT scan 05/31/2019 FINDINGS: Lower chest: The lung bases are clear of acute process. No pleural effusion or pulmonary lesions. The heart is normal in size. No pericardial effusion. The distal esophagus and aorta are unremarkable. Hepatobiliary: No focal hepatic lesions are identified without contrast. The gallbladder appears normal. No common bile duct dilatation. Pancreas: No mass, inflammation or ductal dilatation. Spleen: Normal size.  No focal lesions. Adrenals/Urinary Tract: The adrenal glands and kidneys are grossly normal. No renal, ureteral or bladder calculi or mass is identified without contrast. Stomach/Bowel: The stomach, duodenum, small bowel and colon are grossly normal. No obvious acute inflammatory changes, mass lesions or obstructive findings. The terminal ileum is grossly normal. Do not see the appendix for certain but I do not see any definite findings to suggest acute appendicitis. Vascular/Lymphatic: The aorta is normal in caliber. No atheroscerlotic calcifications. No mesenteric of retroperitoneal mass or adenopathy. Small scattered lymph nodes are noted. Reproductive: The prostate gland and seminal vesicles are unremarkable. Other: Right inguinal hernia containing fluid is noted. Musculoskeletal: No significant bony findings. IMPRESSION: 1. No acute abdominal/pelvic findings, mass lesions or adenopathy identified without oral or IV contrast. 2. Right inguinal hernia containing fluid. Electronically Signed   By: Rudie Meyer M.D.   On: 07/23/2019 17:31      Discharge Exam: Vitals:   07/30/19 0552 07/30/19 0942  BP: (!) 171/93 (!) 164/84  Pulse: 82 65  Resp: 18 18  Temp: 97.8 F (36.6 C) 98 F (36.7 C)  SpO2: 100% 100%    General: Pt is alert, awake, not in acute distress Cardiovascular: RRR, S1/S2 +, no edema Respiratory: CTA bilaterally, no  wheezing, no rhonchi, no respiratory distress, no conversational dyspnea  Abdominal: Soft, NT, ND, bowel sounds + Extremities: no edema, no cyanosis Psych: Normal mood and affect, stable judgement and insight     The results of significant diagnostics from this hospitalization (including imaging, microbiology, ancillary and laboratory) are listed below for reference.     Microbiology: Recent Results (from the past 240 hour(s))  SARS CORONAVIRUS 2 (TAT 6-24 HRS) Nasopharyngeal Nasopharyngeal Swab     Status: None   Collection Time: 07/23/19  6:15 PM   Specimen: Nasopharyngeal Swab  Result Value Ref Range Status   SARS Coronavirus 2 NEGATIVE NEGATIVE Final    Comment: (NOTE) SARS-CoV-2 target nucleic acids are NOT DETECTED. The SARS-CoV-2 RNA is generally detectable in upper and lower respiratory specimens during the acute phase of infection. Negative results do not preclude SARS-CoV-2 infection, do not rule out co-infections with other pathogens, and should not be used as the sole basis  for treatment or other patient management decisions. Negative results must be combined with clinical observations, patient history, and epidemiological information. The expected result is Negative. Fact Sheet for Patients: HairSlick.nohttps://www.fda.gov/media/138098/download Fact Sheet for Healthcare Providers: quierodirigir.comhttps://www.fda.gov/media/138095/download This test is not yet approved or cleared by the [place] FDA and  has been authorized for detection and/or diagnosis of SARS-CoV-2 by FDA under an Emergency Use Authorization (EUA). This EUA will remain  in effect (meaning this test can be used) for the duration of the COVID-19 declaration under Section 56 4(b)(1) of the Act, 21 U.S.C. section 360bbb-3(b)(1), unless the authorization is terminated or revoked sooner. Performed at [facility], [address] [place], [place] [address]   SARS CORONAVIRUS 2 (TAT 6-24 HRS) Nasopharyngeal  Nasopharyngeal Swab     Status: None   Collection Time: 07/29/19 10:36 AM   Specimen: Nasopharyngeal Swab  Result Value Ref Range Status   SARS Coronavirus 2 NEGATIVE NEGATIVE Final    Comment: (NOTE) SARS-CoV-2 target nucleic acids are NOT DETECTED. The SARS-CoV-2 RNA is generally detectable in upper and lower respiratory specimens during the acute phase of infection. Negative results do not preclude SARS-CoV-2 infection, do not rule out co-infections with other pathogens, and should not be used as the sole basis for treatment or other patient management decisions. Negative results must be combined with clinical observations, patient history, and epidemiological information. The expected result is Negative. Fact Sheet for Patients: HairSlick.nohttps://www.fda.gov/media/138098/download Fact Sheet for Healthcare Providers: quierodirigir.comhttps://www.fda.gov/media/138095/download This test is not yet approved or cleared by the [place] FDA and  has been authorized for detection and/or diagnosis of SARS-CoV-2 by FDA under an Emergency Use Authorization (EUA). This EUA will remain  in effect (meaning this test can be used) for the duration of the COVID-19 declaration under Section 56 4(b)(1) of the Act, 21 U.S.C. section 360bbb-3(b)(1), unless the authorization is terminated or revoked sooner. Performed at [facility], [address] [place], [place] [address]      Labs: BNP (last 3 results) No results for input(s): BNP in the last 8760 hours. Basic Metabolic Panel: Recent Labs  Lab 07/24/19 0040 07/25/19 1134 07/29/19 0825 07/29/19 1548 07/29/19 1946 07/30/19 0706  NA 132* 136 125* 130* 127* 131*  K 3.7 3.6 5.5* 3.5 2.9* 2.9*  CL 103 105 87* 96* 91* 96*  CO2 21* 22 17* 16* 25 23  GLUCOSE 135* 140* 146* 115* 114* 108*  BUN 22* 13 51* 43* 35* 26*  CREATININE 1.48* 1.22 3.08* 2.01* 1.92* 1.51*  CALCIUM 8.6* 9.7 9.9 8.6* 8.6* 8.6*  MG 1.7  --   --  2.1  --   --   PHOS 2.7  --   --    --   --   --    Liver Function Tests: Recent Labs  Lab 07/23/19 1556 07/24/19 0040 07/25/19 1134 07/29/19 0825 07/30/19 0706  AST 33 32 31 56* 18  ALT 20 18 24 14 16   ALKPHOS 67 47 54 58 43  BILITOT 1.1 1.2 1.1 2.8* 1.4*  PROT 10.3* 7.4 8.4* 9.5* 6.6  ALBUMIN 5.2* 3.8 4.3 4.7 3.3*   Recent Labs  Lab 07/23/19 1556 07/29/19 0825  LIPASE 26 27   No results for input(s): AMMONIA in the last 168 hours. CBC: Recent Labs  Lab 07/23/19 1556 07/24/19 0040 07/25/19 1134 07/29/19 0825 07/30/19 0706  WBC 22.4* 17.6* 19.9* 20.3* 12.5*  NEUTROABS 18.2*  --  17.4* 15.1*  --   HGB 18.7* 15.7  17.1* 20.6* 15.5  HCT 54.5* 46.2 50.5 57.6* 43.9  MCV 87.9 90.4 89.2 84.5 86.2  PLT 316 257 281 358 253   Cardiac Enzymes: Recent Labs  Lab 07/24/19 0040 07/29/19 1957  CKTOTAL 990* 190   BNP: Invalid input(s): POCBNP CBG: No results for input(s): GLUCAP in the last 168 hours. D-Dimer No results for input(s): DDIMER in the last 72 hours. Hgb A1c No results for input(s): HGBA1C in the last 72 hours. Lipid Profile No results for input(s): CHOL, HDL, LDLCALC, TRIG, CHOLHDL, LDLDIRECT in the last 72 hours. Thyroid function studies No results for input(s): TSH, T4TOTAL, T3FREE, THYROIDAB in the last 72 hours.  Invalid input(s): FREET3 Anemia work up No results for input(s): VITAMINB12, FOLATE, FERRITIN, TIBC, IRON, RETICCTPCT in the last 72 hours. Urinalysis    Component Value Date/Time   COLORURINE YELLOW 07/29/2019 1140   APPEARANCEUR CLEAR 07/29/2019 1140   APPEARANCEUR Clear 11/24/2013 1333   LABSPEC 1.020 07/29/2019 1140   LABSPEC 1.030 11/24/2013 1333   PHURINE 5.0 07/29/2019 1140   GLUCOSEU NEGATIVE 07/29/2019 1140   GLUCOSEU Negative 11/24/2013 1333   HGBUR MODERATE (A) 07/29/2019 1140   BILIRUBINUR NEGATIVE 07/29/2019 1140   BILIRUBINUR 1+ 11/24/2013 1333   KETONESUR 5 (A) 07/29/2019 1140   PROTEINUR 100 (A) 07/29/2019 1140   UROBILINOGEN 1.0 01/04/2015 1651    NITRITE NEGATIVE 07/29/2019 1140   LEUKOCYTESUR NEGATIVE 07/29/2019 1140   LEUKOCYTESUR Negative 11/24/2013 1333   Sepsis Labs Invalid input(s): PROCALCITONIN,  WBC,  LACTICIDVEN Microbiology Recent Results (from the past 240 hour(s))  SARS CORONAVIRUS 2 (TAT 6-24 HRS) Nasopharyngeal Nasopharyngeal Swab     Status: None   Collection Time: 07/23/19  6:15 PM   Specimen: Nasopharyngeal Swab  Result Value Ref Range Status   SARS Coronavirus 2 NEGATIVE NEGATIVE Final    Comment: (NOTE) SARS-CoV-2 target nucleic acids are NOT DETECTED. The SARS-CoV-2 RNA is generally detectable in upper and lower respiratory specimens during the acute phase of infection. Negative results do not preclude SARS-CoV-2 infection, do not rule out co-infections with other pathogens, and should not be used as the sole basis for treatment or other patient management decisions. Negative results must be combined with clinical observations, patient history, and epidemiological information. The expected result is Negative. Fact Sheet for Patients: HairSlick.no Fact Sheet for Healthcare Providers: quierodirigir.com This test is not yet approved or cleared by the [place] FDA and  has been authorized for detection and/or diagnosis of SARS-CoV-2 by FDA under an Emergency Use Authorization (EUA). This EUA will remain  in effect (meaning this test can be used) for the duration of the COVID-19 declaration under Section 56 4(b)(1) of the Act, 21 U.S.C. section 360bbb-3(b)(1), unless the authorization is terminated or revoked sooner. Performed at [facility], [address], [place], [place] [address]   SARS CORONAVIRUS 2 (TAT 6-24 HRS) Nasopharyngeal Nasopharyngeal Swab     Status: None   Collection Time: 07/29/19 10:36 AM   Specimen: Nasopharyngeal Swab  Result Value Ref Range Status   SARS Coronavirus 2 NEGATIVE NEGATIVE Final    Comment:  (NOTE) SARS-CoV-2 target nucleic acids are NOT DETECTED. The SARS-CoV-2 RNA is generally detectable in upper and lower respiratory specimens during the acute phase of infection. Negative results do not preclude SARS-CoV-2 infection, do not rule out co-infections with other pathogens, and should not be used as the sole basis for treatment or other patient management decisions. Negative results must be combined with clinical observations, patient history, and  epidemiological information. The expected result is Negative. Fact Sheet for Patients: HairSlick.no Fact Sheet for Healthcare Providers: quierodirigir.com This test is not yet approved or cleared by the [place] FDA and  has been authorized for detection and/or diagnosis of SARS-CoV-2 by FDA under an Emergency Use Authorization (EUA). This EUA will remain  in effect (meaning this test can be used) for the duration of the COVID-19 declaration under Section 56 4(b)(1) of the Act, 21 U.S.C. section 360bbb-3(b)(1), unless the authorization is terminated or revoked sooner. Performed at [facility], [address] [place], [place] [address]      Patient was seen and examined on the day of discharge and was found to be in stable condition. Time coordinating discharge: 35 minutes including assessment and coordination of care, as well as examination of the patient.   SIGNED:  Noralee Stain, DO [facility] 07/30/2019, 9:57 AM

## 2019-07-30 NOTE — Plan of Care (Signed)
  Problem: Education: Goal: Knowledge of General Education information will improve Description: Including pain rating scale, medication(s)/side effects and non-pharmacologic comfort measures Outcome: Adequate for Discharge   Problem: Health Behavior/Discharge Planning: Goal: Ability to manage health-related needs will improve Outcome: Adequate for Discharge   Problem: Clinical Measurements: Goal: Ability to maintain clinical measurements within normal limits will improve 07/30/2019 1303 by Baldo Ash, RN Outcome: Adequate for Discharge 07/30/2019 0830 by Baldo Ash, RN Outcome: Progressing Goal: Will remain free from infection Outcome: Adequate for Discharge Goal: Diagnostic test results will improve Outcome: Adequate for Discharge Goal: Respiratory complications will improve Outcome: Adequate for Discharge Goal: Cardiovascular complication will be avoided Outcome: Adequate for Discharge   Problem: Activity: Goal: Risk for activity intolerance will decrease Outcome: Adequate for Discharge   Problem: Nutrition: Goal: Adequate nutrition will be maintained Outcome: Adequate for Discharge   Problem: Coping: Goal: Level of anxiety will decrease Outcome: Adequate for Discharge   Problem: Elimination: Goal: Will not experience complications related to bowel motility Outcome: Adequate for Discharge Goal: Will not experience complications related to urinary retention Outcome: Adequate for Discharge   Problem: Pain Managment: Goal: General experience of comfort will improve Outcome: Adequate for Discharge   Problem: Safety: Goal: Ability to remain free from injury will improve Outcome: Adequate for Discharge   Problem: Skin Integrity: Goal: Risk for impaired skin integrity will decrease Outcome: Adequate for Discharge

## 2019-07-30 NOTE — Plan of Care (Signed)
  Problem: Clinical Measurements: Goal: Ability to maintain clinical measurements within normal limits will improve Outcome: Progressing   

## 2020-01-13 ENCOUNTER — Encounter (HOSPITAL_BASED_OUTPATIENT_CLINIC_OR_DEPARTMENT_OTHER): Payer: Self-pay

## 2020-01-13 ENCOUNTER — Other Ambulatory Visit: Payer: Self-pay

## 2020-01-13 ENCOUNTER — Emergency Department (HOSPITAL_BASED_OUTPATIENT_CLINIC_OR_DEPARTMENT_OTHER): Payer: Self-pay

## 2020-01-13 ENCOUNTER — Observation Stay (HOSPITAL_BASED_OUTPATIENT_CLINIC_OR_DEPARTMENT_OTHER)
Admission: EM | Admit: 2020-01-13 | Discharge: 2020-01-14 | Disposition: A | Discharge status: Home / Self Care | Payer: Self-pay | Attending: Internal Medicine | Admitting: Internal Medicine

## 2020-01-13 DIAGNOSIS — R1013 Epigastric pain: Secondary | ICD-10-CM | POA: Insufficient documentation

## 2020-01-13 DIAGNOSIS — Z8719 Personal history of other diseases of the digestive system: Secondary | ICD-10-CM | POA: Insufficient documentation

## 2020-01-13 DIAGNOSIS — K5900 Constipation, unspecified: Secondary | ICD-10-CM | POA: Insufficient documentation

## 2020-01-13 DIAGNOSIS — F17210 Nicotine dependence, cigarettes, uncomplicated: Secondary | ICD-10-CM | POA: Insufficient documentation

## 2020-01-13 DIAGNOSIS — Z20822 Contact with and (suspected) exposure to covid-19: Secondary | ICD-10-CM | POA: Insufficient documentation

## 2020-01-13 DIAGNOSIS — R000 Tachycardia, unspecified: Secondary | ICD-10-CM | POA: Insufficient documentation

## 2020-01-13 DIAGNOSIS — Z79899 Other long term (current) drug therapy: Secondary | ICD-10-CM | POA: Insufficient documentation

## 2020-01-13 DIAGNOSIS — Z21 Asymptomatic human immunodeficiency virus [HIV] infection status: Secondary | ICD-10-CM | POA: Insufficient documentation

## 2020-01-13 DIAGNOSIS — J45909 Unspecified asthma, uncomplicated: Secondary | ICD-10-CM | POA: Insufficient documentation

## 2020-01-13 DIAGNOSIS — F1290 Cannabis use, unspecified, uncomplicated: Secondary | ICD-10-CM | POA: Insufficient documentation

## 2020-01-13 DIAGNOSIS — R112 Nausea with vomiting, unspecified: Secondary | ICD-10-CM | POA: Diagnosis present

## 2020-01-13 DIAGNOSIS — N179 Acute kidney failure, unspecified: Principal | ICD-10-CM | POA: Diagnosis present

## 2020-01-13 LAB — COMPREHENSIVE METABOLIC PANEL
ALT: 36 U/L (ref 0–44)
AST: 30 U/L (ref 15–41)
Albumin: 5.6 g/dL — ABNORMAL HIGH (ref 3.5–5.0)
Alkaline Phosphatase: 73 U/L (ref 38–126)
Anion gap: 18 — ABNORMAL HIGH (ref 5–15)
BUN: 26 mg/dL — ABNORMAL HIGH (ref 6–20)
CO2: 21 mmol/L — ABNORMAL LOW (ref 22–32)
Calcium: 11.1 mg/dL — ABNORMAL HIGH (ref 8.9–10.3)
Chloride: 99 mmol/L (ref 98–111)
Creatinine, Ser: 3.48 mg/dL — ABNORMAL HIGH (ref 0.61–1.24)
GFR calc Af Amer: 25 mL/min — ABNORMAL LOW (ref >60)
GFR calc non Af Amer: 21 mL/min — ABNORMAL LOW (ref >60)
Glucose, Bld: 166 mg/dL — ABNORMAL HIGH (ref 70–99)
Potassium: 4.5 mmol/L (ref 3.5–5.1)
Sodium: 138 mmol/L (ref 135–145)
Total Bilirubin: 0.5 mg/dL (ref 0.3–1.2)
Total Protein: 10.7 g/dL — ABNORMAL HIGH (ref 6.5–8.1)

## 2020-01-13 LAB — CBC WITH DIFFERENTIAL/PLATELET
Abs Immature Granulocytes: 0.39 10*3/uL — ABNORMAL HIGH (ref 0.00–0.07)
Basophils Absolute: 0.1 10*3/uL (ref 0.0–0.1)
Basophils Relative: 0 %
Eosinophils Absolute: 0 10*3/uL (ref 0.0–0.5)
Eosinophils Relative: 0 %
HCT: 56.9 % — ABNORMAL HIGH (ref 39.0–52.0)
Hemoglobin: 19.5 g/dL — ABNORMAL HIGH (ref 13.0–17.0)
Immature Granulocytes: 2 %
Lymphocytes Relative: 10 %
Lymphs Abs: 2.4 10*3/uL (ref 0.7–4.0)
MCH: 30.8 pg (ref 26.0–34.0)
MCHC: 34.3 g/dL (ref 30.0–36.0)
MCV: 89.7 fL (ref 80.0–100.0)
Monocytes Absolute: 0.9 10*3/uL (ref 0.1–1.0)
Monocytes Relative: 4 %
Neutro Abs: 21 10*3/uL — ABNORMAL HIGH (ref 1.7–7.7)
Neutrophils Relative %: 84 %
Platelets: 329 10*3/uL (ref 150–400)
RBC: 6.34 MIL/uL — ABNORMAL HIGH (ref 4.22–5.81)
RDW: 14.6 % (ref 11.5–15.5)
WBC: 24.8 10*3/uL — ABNORMAL HIGH (ref 4.0–10.5)
nRBC: 0 % (ref 0.0–0.2)

## 2020-01-13 LAB — LIPASE, BLOOD: Lipase: 20 U/L (ref 11–51)

## 2020-01-13 LAB — RESPIRATORY PANEL BY RT PCR (FLU A&B, COVID)
Influenza A by PCR: NEGATIVE
Influenza B by PCR: NEGATIVE
SARS Coronavirus 2 by RT PCR: NEGATIVE

## 2020-01-13 MED ORDER — LORAZEPAM 2 MG/ML IJ SOLN
1.0000 mg | Freq: Once | INTRAMUSCULAR | Status: AC
  Administered 2020-01-13: 1 mg via INTRAVENOUS
  Filled 2020-01-13: qty 1

## 2020-01-13 MED ORDER — SODIUM CHLORIDE 0.9 % IV SOLN: INTRAVENOUS | Status: DC

## 2020-01-13 MED ORDER — ACETAMINOPHEN 325 MG PO TABS
650.0000 mg | ORAL_TABLET | Freq: Four times a day (QID) | ORAL | Status: DC | PRN
  Administered 2020-01-13 – 2020-01-14 (×2): 650 mg via ORAL
  Filled 2020-01-13 (×2): qty 2

## 2020-01-13 MED ORDER — SODIUM CHLORIDE 0.9 % IV BOLUS
1000.0000 mL | Freq: Once | INTRAVENOUS | Status: AC
  Administered 2020-01-13: 1000 mL via INTRAVENOUS

## 2020-01-13 MED ORDER — FENTANYL CITRATE (PF) 100 MCG/2ML IJ SOLN
50.0000 ug | Freq: Once | INTRAMUSCULAR | Status: AC
  Administered 2020-01-13: 50 ug via INTRAVENOUS
  Filled 2020-01-13: qty 2

## 2020-01-13 MED ORDER — ACETAMINOPHEN 650 MG RE SUPP: 650.0000 mg | Freq: Four times a day (QID) | RECTAL | Status: DC | PRN

## 2020-01-13 MED ORDER — ONDANSETRON HCL 4 MG/2ML IJ SOLN
4.0000 mg | Freq: Four times a day (QID) | INTRAMUSCULAR | Status: DC | PRN
  Administered 2020-01-13 – 2020-01-14 (×2): 4 mg via INTRAVENOUS
  Filled 2020-01-13 (×2): qty 2

## 2020-01-13 MED ORDER — ONDANSETRON HCL 4 MG PO TABS: 4.0000 mg | ORAL_TABLET | Freq: Four times a day (QID) | ORAL | Status: DC | PRN

## 2020-01-13 MED ORDER — ONDANSETRON HCL 4 MG/2ML IJ SOLN
4.0000 mg | Freq: Once | INTRAMUSCULAR | Status: AC
  Administered 2020-01-13: 4 mg via INTRAVENOUS
  Filled 2020-01-13: qty 2

## 2020-01-13 NOTE — ED Notes (Signed)
Mother informed of room number at [facility]

## 2020-01-13 NOTE — ED Provider Notes (Signed)
[facility] EMERGENCY DEPARTMENT Provider Note   CSN: [id number] Arrival date & time: 01/13/20  1120     History Chief Complaint  Patient presents with  . Abdominal Pain    Carlos Guzman is a 34 y.o. male with pertinent past medical history of HIV, pancreatitis, acute renal failure, rhabdo, alcohol abuse, cannabdoid hyperemsis syndrome that presents to the emergency department for nausea, vomiting, epigastric pain that began last night.  Patient states that this feels exactly like his last episode of pancreatitis.  Patient states that he has not been drinking as much as he normally does, "have been having a couple sips here and there."Patient also admits to daily use of marijuana.  Patient denies any other IV drug use.  Patient states that he has had multiple episodes of nausea and vomiting today, nonbloody nonbilious, no coffee-ground appearance.  Patient denies any diarrhea.  Patient states that his abdominal pain is constant and does not radiate anywhere.  Denies any back pain.  Patient denies any shortness of breath, chest pain, leg swelling, paresthesias, numbness, weakness, fevers, chills, palpitations.    Per chart review patient has been admitted to the hospital multiple times for cannabinoid hyperemesis syndrome and admitted each time for AKI due to dehydration.  Always has had leukocytosis and negative CT imaging. Last viral load undetectable in 11/20.   HPI     Past Medical History:  Diagnosis Date  . Asthma   . HIV (human immunodeficiency virus infection) (HCC)   . Pancreatitis     Patient Active Problem List   Diagnosis Date Noted  . ARF (acute renal failure) (HCC) 07/29/2019  . Hyperkalemia 07/29/2019  . Left upper quadrant abdominal pain 07/29/2019  . Metabolic acidosis, increased anion gap 07/29/2019  . Hyperbilirubinemia 07/29/2019  . Hyponatremia 07/29/2019  . Rhabdomyolysis 07/24/2019  . AKI (acute kidney injury) (HCC) 07/23/2019  . HIV (human  immunodeficiency virus infection) (HCC) 07/23/2019  . Cannabinoid hyperemesis syndrome 07/23/2019  . Hypokalemia 07/23/2019  . Leukocytosis 07/23/2019  . Tobacco abuse 07/23/2019  . 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 No pertinent surgical history.     Family History  Problem Relation Age of Onset  . CAD Neg Hx   . Diabetes Neg Hx     Social History   Tobacco Use  . Smoking status: Current Every Day Smoker    Packs/day: 1.00    Types: Cigarettes  . Smokeless tobacco: Never Used  Substance Use Topics  . Alcohol use: Yes    Comment: occ  . Drug use: Yes    Types: Marijuana    Home Medications Prior to Admission medications   Medication Sig Start Date End Date Taking? Authorizing Provider  abacavir-dolutegravir-lamiVUDine (TRIUMEQ) 600-50-300 MG tablet Take 1 tablet by mouth daily. 10/02/17   [provider]  bictegravir-emtricitabine-tenofovir AF (BIKTARVY) 50-200-25 MG TABS tablet Take 1 tablet by mouth daily.  04/03/19   [provider]  capsaicin (ZOSTRIX) 0.025 % cream Apply topically 2 (two) times daily. 07/30/19   Noralee Stain, DO  dicyclomine (BENTYL) 20 MG tablet Take 1 tablet (20 mg total) by mouth 2 (two) times daily as needed (abdominal cramping). 07/25/19   Robinson, [place] N, PA-C  metoCLOPramide (REGLAN) 10 MG tablet Take 1 tablet (10 mg total) by mouth every 6 (six) hours as needed for nausea or vomiting. 06/10/19   Molpus, John, MD  omeprazole (PRILOSEC) 20 MG capsule Take 1 capsule (20 mg total) by mouth daily. 07/25/19  Robinson, [place] N, PA-C  ondansetron (ZOFRAN ODT) 4 MG disintegrating tablet Take 1 tablet (4 mg total) by mouth every 8 (eight) hours as needed for nausea or vomiting. 07/30/19   Noralee Stain, DO  pantoprazole (PROTONIX) 20 MG tablet Take 1 tablet (20 mg total) by mouth daily. 06/08/19   Tegeler, Canary Brim, MD  famotidine (PEPCID) 20 MG tablet Take 1 tablet (20 mg total) by mouth 2 (two) times  daily. 10/11/17 06/10/19  Elson Areas, PA-C  sucralfate (CARAFATE) 1 g tablet Take 1 tablet (1 g total) by mouth 4 (four) times daily -  with meals and at bedtime. 06/10/19 06/10/19  Molpus, Jonny Ruiz, MD    Allergies    Patient has no known allergies.  Review of Systems   Review of Systems  Constitutional: Negative for chills, diaphoresis, fatigue and fever.  HENT: Negative for congestion, sore throat and trouble swallowing.   Eyes: Negative for pain and visual disturbance.  Respiratory: Negative for cough, shortness of breath and wheezing.   Cardiovascular: Negative for chest pain, palpitations and leg swelling.  Gastrointestinal: Positive for abdominal pain, nausea and vomiting. Negative for abdominal distention and diarrhea.  Genitourinary: Negative for difficulty urinating.  Musculoskeletal: Negative for back pain, neck pain and neck stiffness.  Skin: Negative for pallor.  Neurological: Negative for dizziness, speech difficulty, weakness and headaches.  Psychiatric/Behavioral: Negative for confusion.    Physical Exam Updated Vital Signs BP (!) 140/96   Pulse 100   Temp 97.6 F (36.4 C) (Oral)   Resp 15   Ht 6\' 1"  (1.854 m)   Wt 77.1 kg   SpO2 96%   BMI 22.43 kg/m   Physical Exam Constitutional:      General: He is not in acute distress.    Appearance: Normal appearance. He is not ill-appearing, toxic-appearing or diaphoretic.  HENT:     Mouth/Throat:     Mouth: Mucous membranes are moist.     Pharynx: Oropharynx is clear.  Eyes:     General: No scleral icterus.    Extraocular Movements: Extraocular movements intact.     Pupils: Pupils are equal, round, and reactive to light.  Cardiovascular:     Rate and Rhythm: Regular rhythm. Tachycardia present.     Pulses: Normal pulses.     Heart sounds: Normal heart sounds.  Pulmonary:     Effort: Pulmonary effort is normal. No respiratory distress.     Breath sounds: Normal breath sounds. No stridor. No wheezing, rhonchi or  rales.  Chest:     Chest wall: No tenderness.  Abdominal:     General: Abdomen is flat. Bowel sounds are normal. There is no distension.     Palpations: Abdomen is soft.     Tenderness: There is abdominal tenderness in the epigastric area. There is no guarding or rebound. Negative signs include Murphy's sign, Rovsing's sign and McBurney's sign.  Musculoskeletal:        General: No swelling or tenderness. Normal range of motion.     Cervical back: Normal range of motion and neck supple. No rigidity.     Right lower leg: No edema.     Left lower leg: No edema.  Skin:    General: Skin is warm and dry.     Capillary Refill: Capillary refill takes less than 2 seconds.     Coloration: Skin is not pale.  Neurological:     General: No focal deficit present.     Mental Status: He is alert and  oriented to person, place, and time.  Psychiatric:        Mood and Affect: Mood normal.        Behavior: Behavior normal.     ED Results / Procedures / Treatments   Labs (all labs ordered are listed, but only abnormal results are displayed) Labs Reviewed  COMPREHENSIVE METABOLIC PANEL - Abnormal; Notable for the following components:      Result Value   CO2 21 (*)    Glucose, Bld 166 (*)    BUN 26 (*)    Creatinine, Ser 3.48 (*)    Calcium 11.1 (*)    Total Protein 10.7 (*)    Albumin 5.6 (*)    GFR calc non Af Amer 21 (*)    GFR calc Af Amer 25 (*)    Anion gap 18 (*)    All other components within normal limits  CBC WITH DIFFERENTIAL/PLATELET - Abnormal; Notable for the following components:   WBC 24.8 (*)    RBC 6.34 (*)    Hemoglobin 19.5 (*)    HCT 56.9 (*)    Neutro Abs 21.0 (*)    Abs Immature Granulocytes 0.39 (*)    All other components within normal limits  RESPIRATORY PANEL BY RT PCR (FLU A&B, COVID)  LIPASE, BLOOD  URINALYSIS, ROUTINE W REFLEX MICROSCOPIC    EKG None  Radiology CT ABDOMEN PELVIS WO CONTRAST  Result Date: 01/13/2020 CLINICAL DATA:  Abdominal pain,  history of HIV, states pain feels like pancreatitis EXAM: CT ABDOMEN AND PELVIS WITHOUT CONTRAST TECHNIQUE: Multidetector CT imaging of the abdomen and pelvis was performed following the standard protocol without IV contrast. COMPARISON:  10/14/2019 FINDINGS: Lower chest: No acute abnormality. Hepatobiliary: No solid liver abnormality is seen. No gallstones, gallbladder wall thickening, or biliary dilatation. Pancreas: Unremarkable. No pancreatic ductal dilatation or surrounding inflammatory changes. Spleen: Normal in size without significant abnormality. Adrenals/Urinary Tract: Adrenal glands are unremarkable. Kidneys are normal, without renal calculi, solid lesion, or hydronephrosis. Bladder is unremarkable. Stomach/Bowel: Stomach is within normal limits. Appendix appears normal. No evidence of bowel wall thickening, distention, or inflammatory changes. Vascular/Lymphatic: No significant vascular findings are present. No enlarged abdominal or pelvic lymph nodes. Reproductive: No mass or other significant abnormality. Other: No abdominal wall hernia or abnormality. No abdominopelvic ascites. Musculoskeletal: No acute or significant osseous findings. IMPRESSION: No non-contrast CT findings of the abdomen or pelvis to explain pain. No evidence of pancreatitis. Electronically Signed   By: Lauralyn Primes M.D.   On: 01/13/2020 13:05    Procedures Procedures (including critical care time)  Medications Ordered in ED Medications  sodium chloride 0.9 % bolus 1,000 mL (1,000 mLs Intravenous New Bag/Given 01/13/20 1149)    And  0.9 %  sodium chloride infusion ( Intravenous New Bag/Given 01/13/20 1252)  fentaNYL (SUBLIMAZE) injection 50 mcg (50 mcg Intravenous Given 01/13/20 1228)  ondansetron (ZOFRAN) injection 4 mg (4 mg Intravenous Given 01/13/20 1227)  LORazepam (ATIVAN) injection 1 mg (1 mg Intravenous Given 01/13/20 1250)    ED Course  I have reviewed the triage vital signs and the nursing notes.  Pertinent labs &  imaging results that were available during my care of the patient were reviewed by me and considered in my medical decision making (see chart for details).    MDM Rules/Calculators/A&P                      Carlos Guzman is a 34 y.o. male with pertinent past medical  history of HIV, pancreatitis, acute renal failure, rhabdo, alcohol abuse, cannabdoid hyperemsis syndrome that presents to the emergency department for nausea, vomiting, epigastric pain that began last night.  Differential diagnoses considered  include but are not limited to AAA, mesenteric ischemia, appendicitis, diverticulitis, DKA, gastritis, gastroenteritis, AMI, nephrolithiasis, pancreatitis, peritonitis, adrenal insufficiency,lead poisoning, iron toxicity, intestinal ischemia, constipation, UTI,SBO/LBO, splenic rupture, biliary disease, IBD, IBS, PUD, or hepatitis.  Patient is continuously vomiting, normal exam with mild epigastric tenderness.  No guarding on exam or peritoneal signs.  Patient tachycardic at 150 when he arrived, when I entered the room 5 minutes later patient's heart rate was 114.  Initial interventions IV Zofran, IV fluid, fentanyl.  After reassessment  1 hour later patient is still actively vomiting, will try Ativan for nausea control.  Does not have surgical abdomen or peritoneal signs at this time.    ECG interpreted by me demonstrated sinus tachycardia.    Labs demonstrated normal lipase, CMP with creatinine to 3.48, normal baseline 1.5.  CBC with leukocytosis of 24.8.  Upon reassessment patient states that he feels much better, nausea has  Been slightly controlled.  Patient has vomited, however states that he does feel better.  No signs of surgical abdomen or peritoneal signs.  Patient needs to be admitted for continuous nausea vomiting, rehydration and AKI.  Will consult hospitalist.  Given the above findings, my suspicion is that patient has hyperemesis cannabinoid syndrome due to presentation and past  admissions, on past admissions patient had normal CT with leukocytosis.  Patient could also have gastritis or other infectious cause.  Patient heart rate 104 now.   Spoke to Dr.Zhang at 14:30 who agreed to admit the patient to tele. The patient appears reasonably stabilized for admission considering the current resources, flow, and capabilities available in the ED at this time, and I doubt any other [facility] requiring further screening and/or treatment in the ED prior to admission.  The plan for this patient was discussed with Dr. Reubin Milan, who voiced agreement and who oversaw evaluation and treatment of this patient.  Final Clinical Impression(s) / ED Diagnoses Final diagnoses:  Intractable vomiting with nausea, unspecified vomiting type  AKI (acute kidney injury) [facility])    Rx / DC Orders ED Discharge Orders    None       Alfredia Client, PA-C 01/13/20 1609    Lucrezia Starch, MD 01/14/20 [phone]

## 2020-01-13 NOTE — ED Notes (Signed)
ED Provider at bedside. 

## 2020-01-13 NOTE — ED Notes (Signed)
Pt. Is asleep, will ask for urine when awake.

## 2020-01-13 NOTE — ED Triage Notes (Signed)
Pt c/o abd pain, n/v started yesterday-states feels like pancreatitis pain-NAD-steady gait

## 2020-01-13 NOTE — H&P (Signed)
History and Physical    Carlos Guzman VOJ:[id number] DOB: Jun 26, 1986 DOA: 01/13/2020  PCP: Patient, No Pcp Per   Chief Complaint: Intractable nausea vomiting  HPI: Carlos Guzman is a 34 y.o. male with medical history significant of HIV, pancreatitis, acute renal failure, rhabdo, alcohol abuse, cannabdoid hyperemsis syndrome that presents to the emergency department for nausea, vomiting, epigastric pain that began last night.  Patient recently admitted with plan to stop drinking and smoking - has started drinking again without issue - although he indicates he has not been drinking as much as he normally does, "have been having a couple sips here and there." Patient does admit to increasing marijuana use over the past week.  Patient denies any other illicit substance use/abuse.  Patient states that he has had multiple episodes of nausea and vomiting today, nonbloody nonbilious, no coffee-ground appearance.  Patient denies any diarrhea.  Patient states that his abdominal pain is constant and does not radiate anywhere.  Denies any back pain.  Patient denies any shortness of breath, chest pain, leg swelling, paresthesias, numbness, weakness, fevers, chills, palpitations. Per chart review patient has been admitted to the hospital multiple times for cannabinoid hyperemesis syndrome and admitted each time for AKI due to dehydration.  Always has had leukocytosis and negative CT imaging. Last viral load undetectable in 11/20.   ED Course: Patient evaluated in the ED requiring IV fluids, normal saline bolus at 1 L, Zofran IV x1 as well as 50 mcg fentanyl and 1 mg Ativan for symptom control.  Hospitalist called to admit for intractable nausea vomiting and inability to tolerate p.o. safely.  Review of Systems: As per HPI intractable nausea vomiting, declines headache, fevers, chills, diarrhea, constipation, sick contacts, recent travel, chest pain, shortness of breath.  Assessment/Plan Active Problems:   AKI  (acute kidney injury) (HCC)   Cannabinoid hyperemesis syndrome    Acute intractable nausea vomiting in the setting of marijuana hyperemesis syndrome, recurrent, POA - Patient has known history of marijuana/cannabinoid hyperemesis syndrome and continues to smoke marijuana - very lengthy bedside conversation - he indicates he took a 2 week break from both alcohol and cannabis then slowly re-introduced one then the other as we previously discussed at his last hospitalization. He had no issues tolerating alcohol but when he began to smoke marijuana again he had worsening symptoms. - Imaging unremarkable for any acute findings per radiology - Continue IV fluids, supportive care, Zofran and Phenergan - Limit narcotic use as constipation would likely only exacerbate patient's symptoms - Lengthy discussion about cessation of marijuana  AKI without history of CKD -Continue IV fluids as above Lab Results  Component Value Date   CREATININE 3.48 (H) 01/13/2020   CREATININE 1.51 (H) 07/30/2019   CREATININE 1.92 (H) 07/29/2019   History of pancreatitis, without acute flare -Lipase negative, CT abdomen pelvis unremarkable for acute inflammation around the pancreas  History of HIV Lab Results  Component Value Date   HIV1RNAQUANT <20 07/24/2019  - Appears well controlled at this time, continue medications once able to tolerate p.o. safely   DVT prophylaxis: Low risk, SCDs and early ambulation only Code Status: Full Family Communication: None present Status is: Observation  Dispo: The patient is from: Home              Anticipated d/c is to: Home              Anticipated d/c date is: 24 hours  Patient currently not medically stable for discharge, unable to take p.o. safely, requires IV fluids in the setting of intractable nausea vomiting hyperemesis syndrome secondary to cannabinoid use and AKI.  Consultants:   None  Procedures:   None planned   Past Medical History:   Diagnosis Date  . Asthma   . HIV (human immunodeficiency virus infection) (HCC)   . Pancreatitis     Past Surgical History:  Procedure Laterality Date  . NO PAST SURGERIES       reports that he has been smoking cigarettes. He has been smoking about 1.00 pack per day. He has never used smokeless tobacco. He reports current alcohol use. He reports current drug use. Drug: Marijuana.  No Known Allergies  Family History  Problem Relation Age of Onset  . CAD Neg Hx   . Diabetes Neg Hx     Prior to Admission medications   Medication Sig Start Date End Date Taking? Authorizing Provider  abacavir-dolutegravir-lamiVUDine (TRIUMEQ) 600-50-300 MG tablet Take 1 tablet by mouth daily. 10/02/17   [provider]  bictegravir-emtricitabine-tenofovir AF (BIKTARVY) 50-200-25 MG TABS tablet Take 1 tablet by mouth daily.  04/03/19   [provider]  capsaicin (ZOSTRIX) 0.025 % cream Apply topically 2 (two) times daily. 07/30/19   Noralee Stain, DO  dicyclomine (BENTYL) 20 MG tablet Take 1 tablet (20 mg total) by mouth 2 (two) times daily as needed (abdominal cramping). 07/25/19   Robinson, [place] N, PA-C  metoCLOPramide (REGLAN) 10 MG tablet Take 1 tablet (10 mg total) by mouth every 6 (six) hours as needed for nausea or vomiting. 06/10/19   Molpus, John, MD  omeprazole (PRILOSEC) 20 MG capsule Take 1 capsule (20 mg total) by mouth daily. 07/25/19   Robinson, [place] N, PA-C  ondansetron (ZOFRAN ODT) 4 MG disintegrating tablet Take 1 tablet (4 mg total) by mouth every 8 (eight) hours as needed for nausea or vomiting. 07/30/19   Noralee Stain, DO  pantoprazole (PROTONIX) 20 MG tablet Take 1 tablet (20 mg total) by mouth daily. 06/08/19   Tegeler, Canary Brim, MD  famotidine (PEPCID) 20 MG tablet Take 1 tablet (20 mg total) by mouth 2 (two) times daily. 10/11/17 06/10/19  Elson Areas, PA-C  sucralfate (CARAFATE) 1 g tablet Take 1 tablet (1 g total) by mouth 4 (four) times daily -  with  meals and at bedtime. 06/10/19 06/10/19  Molpus, Jonny Ruiz, MD    Physical Exam: Vitals:   01/13/20 1430 01/13/20 1445 01/13/20 1500 01/13/20 1600  BP: 133/90 (!) 134/94 (!) 140/96   Pulse: (!) 104 99 100   Resp: 16 17 15    Temp:      TempSrc:      SpO2: 100% 97% 96%   Weight:      Height:    6\' 1"  (1.854 m)    Constitutional: NAD, calm, comfortable Vitals:   01/13/20 1430 01/13/20 1445 01/13/20 1500 01/13/20 1600  BP: 133/90 (!) 134/94 (!) 140/96   Pulse: (!) 104 99 100   Resp: 16 17 15    Temp:      TempSrc:      SpO2: 100% 97% 96%   Weight:      Height:    6\' 1"  (1.854 m)   Eyes: PERRL, lids and conjunctivae normal ENMT: Mucous membranes are moist. Posterior pharynx clear of any exudate or lesions.Normal dentition.  Neck: normal, supple, no masses, no thyromegaly Respiratory: clear to auscultation bilaterally, no wheezing, no crackles. Normal respiratory effort. No accessory  muscle use.  Cardiovascular: Regular rate and rhythm, no murmurs / rubs / gallops. No extremity edema. 2+ pedal pulses. No carotid bruits.  Abdomen: minimal tenderness diffusely without PMI, no masses palpated. No hepatosplenomegaly. Bowel sounds positive.  Musculoskeletal: no clubbing / cyanosis. No joint deformity upper and lower extremities. Good ROM, no contractures. Normal muscle tone.  Skin: no rashes, lesions, ulcers. No induration Neurologic: CN 2-12 grossly intact. Sensation intact, DTR normal. Strength 5/5 in all 4.  Psychiatric: Normal judgment and insight. Alert and oriented x 3. Normal mood.   Labs on Admission: I have personally reviewed following labs and imaging studies  CBC: Recent Labs  Lab 01/13/20 1150  WBC 24.8*  NEUTROABS 21.0*  HGB 19.5*  HCT 56.9*  MCV 89.7  PLT 376   Basic Metabolic Panel: Recent Labs  Lab 01/13/20 1150  NA 138  K 4.5  CL 99  CO2 21*  GLUCOSE 166*  BUN 26*  CREATININE 3.48*  CALCIUM 11.1*   GFR: Estimated Creatinine Clearance: 32.3 mL/min (A)  (by C-G formula based on SCr of 3.48 mg/dL (H)). Liver Function Tests: Recent Labs  Lab 01/13/20 1150  AST 30  ALT 36  ALKPHOS 73  BILITOT 0.5  PROT 10.7*  ALBUMIN 5.6*   Recent Labs  Lab 01/13/20 1150  LIPASE 20   No results for input(s): AMMONIA in the last 168 hours. Coagulation Profile: No results for input(s): INR, PROTIME in the last 168 hours. Cardiac Enzymes: No results for input(s): CKTOTAL, CKMB, CKMBINDEX, TROPONINI in the last 168 hours. BNP (last 3 results) No results for input(s): PROBNP in the last 8760 hours. HbA1C: No results for input(s): HGBA1C in the last 72 hours. CBG: No results for input(s): GLUCAP in the last 168 hours. Lipid Profile: No results for input(s): CHOL, HDL, LDLCALC, TRIG, CHOLHDL, LDLDIRECT in the last 72 hours. Thyroid Function Tests: No results for input(s): TSH, T4TOTAL, FREET4, T3FREE, THYROIDAB in the last 72 hours. Anemia Panel: No results for input(s): VITAMINB12, FOLATE, FERRITIN, TIBC, IRON, RETICCTPCT in the last 72 hours. Urine analysis:    Component Value Date/Time   COLORURINE YELLOW 07/29/2019 1140   APPEARANCEUR CLEAR 07/29/2019 1140   APPEARANCEUR Clear 11/24/2013 1333   LABSPEC 1.020 07/29/2019 1140   LABSPEC 1.030 11/24/2013 1333   PHURINE 5.0 07/29/2019 1140   GLUCOSEU NEGATIVE 07/29/2019 1140   GLUCOSEU Negative 11/24/2013 1333   HGBUR MODERATE (A) 07/29/2019 1140   BILIRUBINUR NEGATIVE 07/29/2019 1140   BILIRUBINUR 1+ 11/24/2013 1333   KETONESUR 5 (A) 07/29/2019 1140   PROTEINUR 100 (A) 07/29/2019 1140   UROBILINOGEN 1.0 01/04/2015 1651   NITRITE NEGATIVE 07/29/2019 1140   LEUKOCYTESUR NEGATIVE 07/29/2019 1140   LEUKOCYTESUR Negative 11/24/2013 1333    Radiological Exams on Admission: CT ABDOMEN PELVIS WO CONTRAST  Result Date: 01/13/2020 CLINICAL DATA:  Abdominal pain, history of HIV, states pain feels like pancreatitis EXAM: CT ABDOMEN AND PELVIS WITHOUT CONTRAST TECHNIQUE: Multidetector CT imaging  of the abdomen and pelvis was performed following the standard protocol without IV contrast. COMPARISON:  10/14/2019 FINDINGS: Lower chest: No acute abnormality. Hepatobiliary: No solid liver abnormality is seen. No gallstones, gallbladder wall thickening, or biliary dilatation. Pancreas: Unremarkable. No pancreatic ductal dilatation or surrounding inflammatory changes. Spleen: Normal in size without significant abnormality. Adrenals/Urinary Tract: Adrenal glands are unremarkable. Kidneys are normal, without renal calculi, solid lesion, or hydronephrosis. Bladder is unremarkable. Stomach/Bowel: Stomach is within normal limits. Appendix appears normal. No evidence of bowel wall thickening, distention, or inflammatory changes.  Vascular/Lymphatic: No significant vascular findings are present. No enlarged abdominal or pelvic lymph nodes. Reproductive: No mass or other significant abnormality. Other: No abdominal wall hernia or abnormality. No abdominopelvic ascites. Musculoskeletal: No acute or significant osseous findings. IMPRESSION: No non-contrast CT findings of the abdomen or pelvis to explain pain. No evidence of pancreatitis. Electronically Signed   By: Lauralyn Primes M.D.   On: 01/13/2020 13:05   Time spent >29min  Azucena Fallen DO [facility]  If 7PM-7AM, please contact night-coverage [email or web address]   01/13/2020, 4:37 PM

## 2020-01-14 LAB — COMPREHENSIVE METABOLIC PANEL
ALT: 27 U/L (ref 0–44)
AST: 27 U/L (ref 15–41)
Albumin: 3.6 g/dL (ref 3.5–5.0)
Alkaline Phosphatase: 51 U/L (ref 38–126)
Anion gap: 7 (ref 5–15)
BUN: 17 mg/dL (ref 6–20)
CO2: 25 mmol/L (ref 22–32)
Calcium: 9 mg/dL (ref 8.9–10.3)
Chloride: 106 mmol/L (ref 98–111)
Creatinine, Ser: 1.52 mg/dL — ABNORMAL HIGH (ref 0.61–1.24)
GFR calc Af Amer: >60 mL/min (ref >60)
GFR calc non Af Amer: 59 mL/min — ABNORMAL LOW (ref >60)
Glucose, Bld: 130 mg/dL — ABNORMAL HIGH (ref 70–99)
Potassium: 4 mmol/L (ref 3.5–5.1)
Sodium: 138 mmol/L (ref 135–145)
Total Bilirubin: 0.8 mg/dL (ref 0.3–1.2)
Total Protein: 7.1 g/dL (ref 6.5–8.1)

## 2020-01-14 LAB — CBC
HCT: 49.8 % (ref 39.0–52.0)
Hemoglobin: 16.4 g/dL (ref 13.0–17.0)
MCH: 30.6 pg (ref 26.0–34.0)
MCHC: 32.9 g/dL (ref 30.0–36.0)
MCV: 92.9 fL (ref 80.0–100.0)
Platelets: 272 10*3/uL (ref 150–400)
RBC: 5.36 MIL/uL (ref 4.22–5.81)
RDW: 14.3 % (ref 11.5–15.5)
WBC: 17 10*3/uL — ABNORMAL HIGH (ref 4.0–10.5)
nRBC: 0 % (ref 0.0–0.2)

## 2020-01-14 MED ORDER — PANTOPRAZOLE SODIUM 40 MG PO TBEC
40.0000 mg | DELAYED_RELEASE_TABLET | Freq: Every day | ORAL | Status: DC
  Administered 2020-01-14: 40 mg via ORAL
  Filled 2020-01-14: qty 1

## 2020-01-14 MED ORDER — METOCLOPRAMIDE HCL 10 MG PO TABS: 10.0000 mg | ORAL_TABLET | Freq: Four times a day (QID) | ORAL | Status: DC | PRN

## 2020-01-14 MED ORDER — BICTEGRAVIR-EMTRICITAB-TENOFOV 50-200-25 MG PO TABS
1.0000 | ORAL_TABLET | Freq: Every day | ORAL | Status: DC
  Administered 2020-01-14: 1 via ORAL
  Filled 2020-01-14: qty 1

## 2020-01-14 MED ORDER — DICYCLOMINE HCL 20 MG PO TABS: 20.0000 mg | ORAL_TABLET | Freq: Two times a day (BID) | ORAL | Status: DC | PRN

## 2020-01-14 NOTE — Progress Notes (Signed)
Ok to order HIV viral and CD4 while pt is here per Dr. Natale Milch.  Ulyses Southward, PharmD, BCIDP, AAHIVP, CPP Infectious Disease Pharmacist 01/14/2020 10:49 AM

## 2020-01-14 NOTE — Discharge Summary (Signed)
Physician Discharge Summary  Carlos Guzman RFF:[id number] DOB: 1986/01/12 DOA: 01/13/2020  PCP: Patient, No Pcp Per  Admit date: 01/13/2020 Discharge date: 01/14/2020  Admitted From: Home Disposition: Home  Recommendations for Outpatient Follow-up:  1. Follow up with PCP in 1-2 weeks -recommended follow-up with residency clinic given no PCP 2. Please obtain BMP/CBC in one week 3. Please follow up on the following pending results:  Discharge Condition: Stable CODE STATUS: Full Diet recommendation: Advance as tolerated to regular diet  Brief/Interim Summary: Carlos Guzman is a 34 y.o. male with medical history significant of HIV, pancreatitis, acute renal failure, rhabdo, alcohol abuse, cannabdoid hyperemsis syndromethat presents to the emergency department for nausea, vomiting, epigastric pain that began last night. Patient recently admitted with plan to stop drinking and smoking - has started drinking again without issue - although he indicates he has not been drinking as much as he normally does, "have been having a couple sips here and there." Patient does admit to increasing marijuana use over the past week. Patient denies any other illicit substance use/abuse. Patient states that he has had multiple episodes of nausea and vomiting today, nonbloody nonbilious, no coffee-ground appearance. Patient denies any diarrhea. Patient states that his abdominal pain is constant and does not radiate anywhere. Denies any back pain. Patient denies any shortness of breath, chest pain, leg swelling, paresthesias, numbness, weakness, fevers, chills, palpitations. Per chart review patient has been admitted to the hospitalmultiple times for cannabinoid hyperemesis syndrome and admitted each time for AKI due to dehydration. Always has had leukocytosis and negative CT imaging. Last viral load undetectable in 11/20  Patient well-known to the hospital system given recurrent episodes of intractable nausea  vomiting abdominal pain.  At this time patient has resolved from intractable nausea vomiting and abdominal pain, with IV fluids and avoidance of narcotics patient's symptoms have resolved overnight, we had a lengthy discussion at bedside yesterday as well as today about need for cessation of cannabinoids both inhaled and animals.  Patient understands that his chronic diagnosis of recurrent pancreatitis may very well be secondary to cannabinoid use given patient has been drinking alcohol over the past week without symptoms but after smoking marijuana he had a recurrence of his intractable nausea vomiting and abdominal pain.  He is convinced this is food related after eating a hamburger at [employer] but given his history more likely etiology is again marijuana use.  Regardless patient is now asymptomatic tolerating p.o. well and otherwise stable for discharge.  Patient will be discharged home, recommend follow-up with residency clinic as he has no PCP and requires close follow-up in the near future.  Discharge Diagnoses:  Active Problems:   AKI (acute kidney injury) (HCC)   Cannabinoid hyperemesis syndrome    Discharge Instructions  Discharge Instructions    Call MD for:  difficulty breathing, headache or visual disturbances   Complete by: As directed    Call MD for:  extreme fatigue   Complete by: As directed    Call MD for:  hives   Complete by: As directed    Call MD for:  persistant dizziness or light-headedness   Complete by: As directed    Call MD for:  persistant nausea and vomiting   Complete by: As directed    Call MD for:  severe uncontrolled pain   Complete by: As directed    Call MD for:  temperature >100.4   Complete by: As directed    Diet - low sodium heart healthy   Complete  by: As directed    Increase activity slowly   Complete by: As directed      Allergies as of 01/14/2020   No Known Allergies     Medication List    STOP taking these medications   pantoprazole  20 MG tablet Commonly known as: PROTONIX     TAKE these medications   bictegravir-emtricitabine-tenofovir AF 50-200-25 MG Tabs tablet Commonly known as: BIKTARVY Take 1 tablet by mouth daily.   capsaicin 0.025 % cream Commonly known as: ZOSTRIX Apply topically 2 (two) times daily.   dicyclomine 20 MG tablet Commonly known as: BENTYL Take 1 tablet (20 mg total) by mouth 2 (two) times daily as needed (abdominal cramping).   metoCLOPramide 10 MG tablet Commonly known as: Reglan Take 1 tablet (10 mg total) by mouth every 6 (six) hours as needed for nausea or vomiting.   omeprazole 20 MG capsule Commonly known as: PRILOSEC Take 1 capsule (20 mg total) by mouth daily. What changed:   when to take this  reasons to take this   ondansetron 4 MG disintegrating tablet Commonly known as: Zofran ODT Take 1 tablet (4 mg total) by mouth every 8 (eight) hours as needed for nausea or vomiting.       No Known Allergies  Consultations:  None   Procedures/Studies: CT ABDOMEN PELVIS WO CONTRAST  Result Date: 01/13/2020 CLINICAL DATA:  Abdominal pain, history of HIV, states pain feels like pancreatitis EXAM: CT ABDOMEN AND PELVIS WITHOUT CONTRAST TECHNIQUE: Multidetector CT imaging of the abdomen and pelvis was performed following the standard protocol without IV contrast. COMPARISON:  10/14/2019 FINDINGS: Lower chest: No acute abnormality. Hepatobiliary: No solid liver abnormality is seen. No gallstones, gallbladder wall thickening, or biliary dilatation. Pancreas: Unremarkable. No pancreatic ductal dilatation or surrounding inflammatory changes. Spleen: Normal in size without significant abnormality. Adrenals/Urinary Tract: Adrenal glands are unremarkable. Kidneys are normal, without renal calculi, solid lesion, or hydronephrosis. Bladder is unremarkable. Stomach/Bowel: Stomach is within normal limits. Appendix appears normal. No evidence of bowel wall thickening, distention, or  inflammatory changes. Vascular/Lymphatic: No significant vascular findings are present. No enlarged abdominal or pelvic lymph nodes. Reproductive: No mass or other significant abnormality. Other: No abdominal wall hernia or abnormality. No abdominopelvic ascites. Musculoskeletal: No acute or significant osseous findings. IMPRESSION: No non-contrast CT findings of the abdomen or pelvis to explain pain. No evidence of pancreatitis. Electronically Signed   By: Lauralyn Primes M.D.   On: 01/13/2020 13:05     Subjective: No acute issues or events overnight, tolerating p.o. well denies nausea, vomiting, diarrhea, constipation, headache, fevers, chills.   Discharge Exam: Vitals:   01/14/20 0518 01/14/20 0908  BP: (!) 159/94 (!) 171/94  Pulse: 65 (!) 52  Resp: 18 18  Temp: 99.1 F (37.3 C) 97.6 F (36.4 C)  SpO2: 100% 100%   Vitals:   01/13/20 2124 01/13/20 2357 01/14/20 0518 01/14/20 0908  BP: (!) 180/91 (!) 152/100 (!) 159/94 (!) 171/94  Pulse: 74 72 65 (!) 52  Resp: 20  18 18   Temp: 97.8 F (36.6 C)  99.1 F (37.3 C) 97.6 F (36.4 C)  TempSrc: Oral  Oral Oral  SpO2: 100%  100% 100%  Weight:      Height:        General: Pt is alert, awake, not in acute distress Cardiovascular: RRR, S1/S2 +, no rubs, no gallops Respiratory: CTA bilaterally, no wheezing, no rhonchi Abdominal: Soft, NT, ND, bowel sounds + Extremities: no edema, no cyanosis  The results of significant diagnostics from this hospitalization (including imaging, microbiology, ancillary and laboratory) are listed below for reference.     Microbiology: Recent Results (from the past 240 hour(s))  Respiratory Panel by RT PCR (Flu A&B, Covid) - Nasopharyngeal Swab     Status: None   Collection Time: 01/13/20  2:35 PM   Specimen: Nasopharyngeal Swab  Result Value Ref Range Status   SARS Coronavirus 2 by RT PCR NEGATIVE NEGATIVE Final    Comment: (NOTE) SARS-CoV-2 target nucleic acids are NOT DETECTED. The SARS-CoV-2  RNA is generally detectable in upper respiratoy specimens during the acute phase of infection. The lowest concentration of SARS-CoV-2 viral copies this assay can detect is 131 copies/mL. A negative result does not preclude SARS-Cov-2 infection and should not be used as the sole basis for treatment or other patient management decisions. A negative result may occur with  improper specimen collection/handling, submission of specimen other than nasopharyngeal swab, presence of viral mutation(s) within the areas targeted by this assay, and inadequate number of viral copies (<131 copies/mL). A negative result must be combined with clinical observations, patient history, and epidemiological information. The expected result is Negative. Fact Sheet for Patients:  https://www.moore.com/https://www.fda.gov/media/142436/download Fact Sheet for Healthcare Providers:  https://www.young.biz/https://www.fda.gov/media/142435/download This test is not yet ap proved or cleared by the [place] FDA and  has been authorized for detection and/or diagnosis of SARS-CoV-2 by FDA under an Emergency Use Authorization (EUA). This EUA will remain  in effect (meaning this test can be used) for the duration of the COVID-19 declaration under Section 564(b)(1) of the Act, 21 U.S.C. section 360bbb-3(b)(1), unless the authorization is terminated or revoked sooner.    Influenza A by PCR NEGATIVE NEGATIVE Final   Influenza B by PCR NEGATIVE NEGATIVE Final    Comment: (NOTE) The Xpert Xpress SARS-CoV-2/FLU/RSV assay is intended as an aid in  the diagnosis of influenza from Nasopharyngeal swab specimens and  should not be used as a sole basis for treatment. Nasal washings and  aspirates are unacceptable for Xpert Xpress SARS-CoV-2/FLU/RSV  testing. Fact Sheet for Patients: https://www.moore.com/https://www.fda.gov/media/142436/download Fact Sheet for Healthcare Providers: https://www.young.biz/https://www.fda.gov/media/142435/download This test is not yet approved or cleared by the [place] FDA  and  has been authorized for detection and/or diagnosis of SARS-CoV-2 by  FDA under an Emergency Use Authorization (EUA). This EUA will remain  in effect (meaning this test can be used) for the duration of the  Covid-19 declaration under Section 564(b)(1) of the Act, 21  U.S.C. section 360bbb-3(b)(1), unless the authorization is  terminated or revoked. Performed at [facility] [place] [facility], [address] [place], [place] [id number]      Labs: BNP (last 3 results) No results for input(s): BNP in the last 8760 hours. Basic Metabolic Panel: Recent Labs  Lab 01/13/20 1150 01/14/20 0653  NA 138 138  K 4.5 4.0  CL 99 106  CO2 21* 25  GLUCOSE 166* 130*  BUN 26* 17  CREATININE 3.48* 1.52*  CALCIUM 11.1* 9.0   Liver Function Tests: Recent Labs  Lab 01/13/20 1150 01/14/20 0653  AST 30 27  ALT 36 27  ALKPHOS 73 51  BILITOT 0.5 0.8  PROT 10.7* 7.1  ALBUMIN 5.6* 3.6   Recent Labs  Lab 01/13/20 1150  LIPASE 20   No results for input(s): AMMONIA in the last 168 hours. CBC: Recent Labs  Lab 01/13/20 1150 01/14/20 0653  WBC 24.8* 17.0*  NEUTROABS 21.0*  --   HGB 19.5* 16.4  HCT 56.9* 49.8  MCV 89.7 92.9  PLT 329 272   Cardiac Enzymes: No results for input(s): CKTOTAL, CKMB, CKMBINDEX, TROPONINI in the last 168 hours. BNP: Invalid input(s): POCBNP CBG: No results for input(s): GLUCAP in the last 168 hours. D-Dimer No results for input(s): DDIMER in the last 72 hours. Hgb A1c No results for input(s): HGBA1C in the last 72 hours. Lipid Profile No results for input(s): CHOL, HDL, LDLCALC, TRIG, CHOLHDL, LDLDIRECT in the last 72 hours. Thyroid function studies No results for input(s): TSH, T4TOTAL, T3FREE, THYROIDAB in the last 72 hours.  Invalid input(s): FREET3 Anemia work up No results for input(s): VITAMINB12, FOLATE, FERRITIN, TIBC, IRON, RETICCTPCT in the last 72 hours. Urinalysis    Component Value Date/Time   COLORURINE YELLOW 07/29/2019 1140    APPEARANCEUR CLEAR 07/29/2019 1140   APPEARANCEUR Clear 11/24/2013 1333   LABSPEC 1.020 07/29/2019 1140   LABSPEC 1.030 11/24/2013 1333   PHURINE 5.0 07/29/2019 1140   GLUCOSEU NEGATIVE 07/29/2019 1140   GLUCOSEU Negative 11/24/2013 1333   HGBUR MODERATE (A) 07/29/2019 1140   BILIRUBINUR NEGATIVE 07/29/2019 1140   BILIRUBINUR 1+ 11/24/2013 1333   KETONESUR 5 (A) 07/29/2019 1140   PROTEINUR 100 (A) 07/29/2019 1140   UROBILINOGEN 1.0 01/04/2015 1651   NITRITE NEGATIVE 07/29/2019 1140   LEUKOCYTESUR NEGATIVE 07/29/2019 1140   LEUKOCYTESUR Negative 11/24/2013 1333   Sepsis Labs Invalid input(s): PROCALCITONIN,  WBC,  LACTICIDVEN Microbiology Recent Results (from the past 240 hour(s))  Respiratory Panel by RT PCR (Flu A&B, Covid) - Nasopharyngeal Swab     Status: None   Collection Time: 01/13/20  2:35 PM   Specimen: Nasopharyngeal Swab  Result Value Ref Range Status   SARS Coronavirus 2 by RT PCR NEGATIVE NEGATIVE Final    Comment: (NOTE) SARS-CoV-2 target nucleic acids are NOT DETECTED. The SARS-CoV-2 RNA is generally detectable in upper respiratoy specimens during the acute phase of infection. The lowest concentration of SARS-CoV-2 viral copies this assay can detect is 131 copies/mL. A negative result does not preclude SARS-Cov-2 infection and should not be used as the sole basis for treatment or other patient management decisions. A negative result may occur with  improper specimen collection/handling, submission of specimen other than nasopharyngeal swab, presence of viral mutation(s) within the areas targeted by this assay, and inadequate number of viral copies (<131 copies/mL). A negative result must be combined with clinical observations, patient history, and epidemiological information. The expected result is Negative. Fact Sheet for Patients:  https://www.moore.com/ Fact Sheet for Healthcare Providers:  https://www.young.biz/ This  test is not yet ap proved or cleared by the [place] FDA and  has been authorized for detection and/or diagnosis of SARS-CoV-2 by FDA under an Emergency Use Authorization (EUA). This EUA will remain  in effect (meaning this test can be used) for the duration of the COVID-19 declaration under Section 564(b)(1) of the Act, 21 U.S.C. section 360bbb-3(b)(1), unless the authorization is terminated or revoked sooner.    Influenza A by PCR NEGATIVE NEGATIVE Final   Influenza B by PCR NEGATIVE NEGATIVE Final    Comment: (NOTE) The Xpert Xpress SARS-CoV-2/FLU/RSV assay is intended as an aid in  the diagnosis of influenza from Nasopharyngeal swab specimens and  should not be used as a sole basis for treatment. Nasal washings and  aspirates are unacceptable for Xpert Xpress SARS-CoV-2/FLU/RSV  testing. Fact Sheet for Patients: https://www.moore.com/ Fact Sheet for Healthcare Providers: https://www.young.biz/ This test is not yet approved or cleared by the [place] FDA  and  has been authorized for detection and/or diagnosis of SARS-CoV-2 by  FDA under an Emergency Use Authorization (EUA). This EUA will remain  in effect (meaning this test can be used) for the duration of the  Covid-19 declaration under Section 564(b)(1) of the Act, 21  U.S.C. section 360bbb-3(b)(1), unless the authorization is  terminated or revoked. Performed at [facility], [address] [place], [place] [address]      Time coordinating discharge: Over 30 minutes  SIGNED:   Little Ishikawa, DO [facility] 01/14/2020, 4:49 PM Pager   If 7PM-7AM, please contact night-coverage [email or web address]

## 2020-01-14 NOTE — Plan of Care (Signed)
  Problem: Education: Goal: Knowledge of General Education information will improve Description: Including pain rating scale, medication(s)/side effects and non-pharmacologic comfort measures Outcome: Progressing   Problem: Pain Managment: Goal: General experience of comfort will improve Outcome: Progressing   

## 2020-01-14 NOTE — Progress Notes (Signed)
DISCHARGE NOTE HOME Carlos Guzman to be discharged Home per MD order. Discussed prescriptions and follow up appointments with the patient. Prescriptions given to patient; medication list explained in detail. Patient verbalized understanding.  Skin clean, dry and intact without evidence of skin break down, no evidence of skin tears noted. IV catheter discontinued intact. Site without signs and symptoms of complications. Dressing and pressure applied. Pt denies pain at the site currently. No complaints noted.  Patient free of lines, drains, and wounds.   An After Visit Summary (AVS) was printed and given to the patient. Patient escorted via wheelchair, and discharged home via private auto.  Lorine Bears, RN

## 2020-01-15 ENCOUNTER — Emergency Department (HOSPITAL_BASED_OUTPATIENT_CLINIC_OR_DEPARTMENT_OTHER)
Admission: EM | Admit: 2020-01-15 | Discharge: 2020-01-15 | Disposition: A | Discharge status: Home / Self Care | Payer: Self-pay | Attending: Emergency Medicine | Admitting: Emergency Medicine

## 2020-01-15 ENCOUNTER — Encounter (HOSPITAL_BASED_OUTPATIENT_CLINIC_OR_DEPARTMENT_OTHER): Payer: Self-pay | Admitting: Emergency Medicine

## 2020-01-15 ENCOUNTER — Other Ambulatory Visit: Payer: Self-pay

## 2020-01-15 DIAGNOSIS — J45909 Unspecified asthma, uncomplicated: Secondary | ICD-10-CM | POA: Insufficient documentation

## 2020-01-15 DIAGNOSIS — Z21 Asymptomatic human immunodeficiency virus [HIV] infection status: Secondary | ICD-10-CM | POA: Insufficient documentation

## 2020-01-15 DIAGNOSIS — R1084 Generalized abdominal pain: Secondary | ICD-10-CM | POA: Insufficient documentation

## 2020-01-15 DIAGNOSIS — Z79899 Other long term (current) drug therapy: Secondary | ICD-10-CM | POA: Insufficient documentation

## 2020-01-15 DIAGNOSIS — F17210 Nicotine dependence, cigarettes, uncomplicated: Secondary | ICD-10-CM | POA: Insufficient documentation

## 2020-01-15 LAB — CBC WITH DIFFERENTIAL/PLATELET
Abs Immature Granulocytes: 0.12 10*3/uL — ABNORMAL HIGH (ref 0.00–0.07)
Basophils Absolute: 0.1 10*3/uL (ref 0.0–0.1)
Basophils Relative: 0 %
Eosinophils Absolute: 0 10*3/uL (ref 0.0–0.5)
Eosinophils Relative: 0 %
HCT: 52.5 % — ABNORMAL HIGH (ref 39.0–52.0)
Hemoglobin: 18.6 g/dL — ABNORMAL HIGH (ref 13.0–17.0)
Immature Granulocytes: 1 %
Lymphocytes Relative: 15 %
Lymphs Abs: 2.8 10*3/uL (ref 0.7–4.0)
MCH: 31.1 pg (ref 26.0–34.0)
MCHC: 35.4 g/dL (ref 30.0–36.0)
MCV: 87.8 fL (ref 80.0–100.0)
Monocytes Absolute: 1.4 10*3/uL — ABNORMAL HIGH (ref 0.1–1.0)
Monocytes Relative: 7 %
Neutro Abs: 14.1 10*3/uL — ABNORMAL HIGH (ref 1.7–7.7)
Neutrophils Relative %: 77 %
Platelets: 316 10*3/uL (ref 150–400)
RBC: 5.98 MIL/uL — ABNORMAL HIGH (ref 4.22–5.81)
RDW: 13.8 % (ref 11.5–15.5)
WBC: 18.4 10*3/uL — ABNORMAL HIGH (ref 4.0–10.5)
nRBC: 0 % (ref 0.0–0.2)

## 2020-01-15 LAB — COMPREHENSIVE METABOLIC PANEL
ALT: 32 U/L (ref 0–44)
AST: 32 U/L (ref 15–41)
Albumin: 4.9 g/dL (ref 3.5–5.0)
Alkaline Phosphatase: 57 U/L (ref 38–126)
Anion gap: 16 — ABNORMAL HIGH (ref 5–15)
BUN: 22 mg/dL — ABNORMAL HIGH (ref 6–20)
CO2: 20 mmol/L — ABNORMAL LOW (ref 22–32)
Calcium: 10.1 mg/dL (ref 8.9–10.3)
Chloride: 99 mmol/L (ref 98–111)
Creatinine, Ser: 1.5 mg/dL — ABNORMAL HIGH (ref 0.61–1.24)
GFR calc Af Amer: >60 mL/min (ref >60)
GFR calc non Af Amer: 59 mL/min — ABNORMAL LOW (ref >60)
Glucose, Bld: 127 mg/dL — ABNORMAL HIGH (ref 70–99)
Potassium: 3.5 mmol/L (ref 3.5–5.1)
Sodium: 135 mmol/L (ref 135–145)
Total Bilirubin: 1.2 mg/dL (ref 0.3–1.2)
Total Protein: 9.2 g/dL — ABNORMAL HIGH (ref 6.5–8.1)

## 2020-01-15 LAB — LIPASE, BLOOD: Lipase: 29 U/L (ref 11–51)

## 2020-01-15 MED ORDER — DICYCLOMINE HCL 10 MG/5ML PO SOLN
10.0000 mg | Freq: Once | ORAL | Status: DC
  Filled 2020-01-15: qty 5

## 2020-01-15 MED ORDER — HYDROMORPHONE HCL 1 MG/ML IJ SOLN
1.0000 mg | Freq: Once | INTRAMUSCULAR | Status: AC
  Administered 2020-01-15: 1 mg via INTRAVENOUS
  Filled 2020-01-15: qty 1

## 2020-01-15 MED ORDER — LIDOCAINE VISCOUS HCL 2 % MT SOLN
15.0000 mL | Freq: Once | OROMUCOSAL | Status: AC
  Administered 2020-01-15: 15:00:00 15 mL via ORAL
  Filled 2020-01-15: qty 15

## 2020-01-15 MED ORDER — DICYCLOMINE HCL 10 MG PO CAPS
ORAL_CAPSULE | ORAL | Status: AC
  Administered 2020-01-15: 10 mg
  Filled 2020-01-15: qty 1

## 2020-01-15 MED ORDER — ONDANSETRON 4 MG PO TBDP
4.0000 mg | ORAL_TABLET | Freq: Three times a day (TID) | ORAL | 0 refills | Status: DC | PRN
Start: 2020-01-15 — End: 2020-03-23

## 2020-01-15 MED ORDER — ALUM & MAG HYDROXIDE-SIMETH 200-200-20 MG/5ML PO SUSP
30.0000 mL | Freq: Once | ORAL | Status: AC
  Administered 2020-01-15: 30 mL via ORAL
  Filled 2020-01-15: qty 30

## 2020-01-15 MED ORDER — DICYCLOMINE HCL 20 MG PO TABS
20.0000 mg | ORAL_TABLET | Freq: Two times a day (BID) | ORAL | 0 refills | Status: DC
Start: 2020-01-15 — End: 2020-11-17

## 2020-01-15 MED ORDER — SODIUM CHLORIDE 0.9 % IV BOLUS
1000.0000 mL | Freq: Once | INTRAVENOUS | Status: AC
  Administered 2020-01-15: 1000 mL via INTRAVENOUS

## 2020-01-15 MED ORDER — ONDANSETRON HCL 4 MG/2ML IJ SOLN
4.0000 mg | Freq: Once | INTRAMUSCULAR | Status: AC
  Administered 2020-01-15: 4 mg via INTRAVENOUS
  Filled 2020-01-15: qty 2

## 2020-01-15 NOTE — ED Notes (Signed)
Completed fluid challenge, no issues.

## 2020-01-15 NOTE — ED Provider Notes (Signed)
[facility] EMERGENCY DEPARTMENT Provider Note   CSN: [id number] Arrival date & time: 01/15/20  1215     History Chief Complaint  Patient presents with  . Abdominal Pain    Carlos Guzman is a 34 y.o. male.  HPI  34 year old male with a history of HIV, chronic pancreatitis, chronic abdominal pain, chronic metabolic acidosis with increased anion gap, chronic hyperbilirubinemia, cannabinoids hyperemesis syndrome, chronic leukocytosis.    Patient was discharged yesterday for hospitalization that lasted 1 day for intractable vomiting abdominal pain.  Patient has been hospitalized in the past for similar symptoms.  He states that he continues to smoke and drink alcohol states that he has tried to cut back however.  Patient denies any other illicit substance use however he does have history of frequent marijuana use and states in our interview today that he has not used marijuana since just prior to his last admission-however on my review of EMR it appears that he denied marijuana use at his last admission-patient states he has had  Patient states that he has had several episodes of vomiting today with epigastric abdominal pain.  He states the vomiting was nonbloody nonbilious.  He states that he has some mild constipation with hard stools however he has been able to pass flatus and has had problems today.  Patient denies any fevers, headache, lightheadedness or dizziness.    Past Medical History:  Diagnosis Date  . Asthma   . HIV (human immunodeficiency virus infection) (HCC)   . Pancreatitis     Patient Active Problem List   Diagnosis Date Noted  . ARF (acute renal failure) (HCC) 07/29/2019  . Hyperkalemia 07/29/2019  . Left upper quadrant abdominal pain 07/29/2019  . Metabolic acidosis, increased anion gap 07/29/2019  . Hyperbilirubinemia 07/29/2019  . Hyponatremia 07/29/2019  . Rhabdomyolysis 07/24/2019  . AKI (acute kidney injury) (HCC) 07/23/2019  . HIV (human  immunodeficiency virus infection) (HCC) 07/23/2019  . Cannabinoid hyperemesis syndrome 07/23/2019  . Hypokalemia 07/23/2019  . Leukocytosis 07/23/2019  . Tobacco abuse 07/23/2019  . 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 NO PAST SURGERIES         Family History  Problem Relation Age of Onset  . CAD Neg Hx   . Diabetes Neg Hx     Social History   Tobacco Use  . Smoking status: Current Every Day Smoker    Packs/day: 1.00    Types: Cigarettes  . Smokeless tobacco: Never Used  Substance Use Topics  . Alcohol use: Yes    Comment: occ  . Drug use: Yes    Types: Marijuana    Home Medications Prior to Admission medications   Medication Sig Start Date End Date Taking? Authorizing Provider  bictegravir-emtricitabine-tenofovir AF (BIKTARVY) 50-200-25 MG TABS tablet Take 1 tablet by mouth daily.  04/03/19   [provider]  capsaicin (ZOSTRIX) 0.025 % cream Apply topically 2 (two) times daily. Patient not taking: Reported on 01/14/2020 07/30/19   Noralee Stain, DO  dicyclomine (BENTYL) 20 MG tablet Take 1 tablet (20 mg total) by mouth 2 (two) times daily. 01/15/20   Gailen Shelter, PA  metoCLOPramide (REGLAN) 10 MG tablet Take 1 tablet (10 mg total) by mouth every 6 (six) hours as needed for nausea or vomiting. Patient not taking: Reported on 01/14/2020 06/10/19   Molpus, John, MD  omeprazole (PRILOSEC) 20 MG capsule Take 1 capsule (20 mg total) by mouth daily. Patient taking  differently: Take 20 mg by mouth daily as needed (reflux).  07/25/19   Robinson, [place] N, PA-C  ondansetron (ZOFRAN ODT) 4 MG disintegrating tablet Take 1 tablet (4 mg total) by mouth every 8 (eight) hours as needed for nausea or vomiting. 01/15/20   Gailen Shelter, PA  famotidine (PEPCID) 20 MG tablet Take 1 tablet (20 mg total) by mouth 2 (two) times daily. 10/11/17 06/10/19  Elson Areas, PA-C  sucralfate (CARAFATE) 1 g tablet Take 1 tablet (1 g  total) by mouth 4 (four) times daily -  with meals and at bedtime. 06/10/19 06/10/19  Molpus, Jonny Ruiz, MD    Allergies    Patient has no known allergies.  Review of Systems   Review of Systems  Constitutional: Negative for chills and fever.  HENT: Negative for congestion.   Eyes: Negative for pain.  Respiratory: Negative for cough and shortness of breath.   Cardiovascular: Negative for chest pain and leg swelling.  Gastrointestinal: Positive for abdominal pain, constipation, nausea and vomiting.  Genitourinary: Negative for dysuria.  Musculoskeletal: Negative for myalgias.  Skin: Negative for rash.  Neurological: Negative for dizziness and headaches.    Physical Exam Updated Vital Signs BP (!) 155/104 (BP Location: Left Arm)   Pulse 88   Temp 98.4 F (36.9 C) (Oral)   Resp 16   Ht 6\' 1"  (1.854 m)   Wt 77.1 kg   SpO2 100%   BMI 22.43 kg/m   Physical Exam Vitals and nursing note reviewed.  Constitutional:      General: He is not in acute distress.    Comments: Patient is pleasant 34 year old male in no acute distress sitting comfortably in bed.  HENT:     Head: Normocephalic and atraumatic.     Nose: Nose normal.     Mouth/Throat:     Mouth: Mucous membranes are dry.  Eyes:     General: No scleral icterus. Cardiovascular:     Rate and Rhythm: Normal rate and regular rhythm.     Pulses: Normal pulses.     Heart sounds: Normal heart sounds.  Pulmonary:     Effort: Pulmonary effort is normal. No respiratory distress.     Breath sounds: No wheezing.  Abdominal:     Palpations: Abdomen is soft.     Tenderness: There is no abdominal tenderness. There is no guarding or rebound.  Musculoskeletal:     Cervical back: Normal range of motion.     Right lower leg: No edema.     Left lower leg: No edema.  Skin:    General: Skin is warm and dry.     Capillary Refill: Capillary refill takes less than 2 seconds.  Neurological:     Mental Status: He is alert. Mental status is at  baseline.  Psychiatric:        Mood and Affect: Mood normal.        Behavior: Behavior normal.     ED Results / Procedures / Treatments   Labs (all labs ordered are listed, but only abnormal results are displayed) Labs Reviewed  CBC WITH DIFFERENTIAL/PLATELET - Abnormal; Notable for the following components:      Result Value   WBC 18.4 (*)    RBC 5.98 (*)    Hemoglobin 18.6 (*)    HCT 52.5 (*)    Neutro Abs 14.1 (*)    Monocytes Absolute 1.4 (*)    Abs Immature Granulocytes 0.12 (*)    All other components within normal  limits  COMPREHENSIVE METABOLIC PANEL - Abnormal; Notable for the following components:   CO2 20 (*)    Glucose, Bld 127 (*)    BUN 22 (*)    Creatinine, Ser 1.50 (*)    Total Protein 9.2 (*)    GFR calc non Af Amer 59 (*)    Anion gap 16 (*)    All other components within normal limits  LIPASE, BLOOD    EKG None  Radiology No results found.  Procedures Procedures (including critical care time)  Medications Ordered in ED Medications  sodium chloride 0.9 % bolus 1,000 mL (0 mLs Intravenous Stopped 01/15/20 1619)  HYDROmorphone (DILAUDID) injection 1 mg (1 mg Intravenous Given 01/15/20 1446)  ondansetron (ZOFRAN) injection 4 mg (4 mg Intravenous Given 01/15/20 1445)  alum & mag hydroxide-simeth (MAALOX/MYLANTA) 200-200-20 MG/5ML suspension 30 mL (30 mLs Oral Given 01/15/20 1446)    And  lidocaine (XYLOCAINE) 2 % viscous mouth solution 15 mL (15 mLs Oral Given 01/15/20 1446)  dicyclomine (BENTYL) 10 MG capsule (10 mg  Given 01/15/20 1459)    ED Course  I have reviewed the triage vital signs and the nursing notes.  Pertinent labs & imaging results that were available during my care of the patient were reviewed by me and considered in my medical decision making (see chart for details).    MDM Rules/Calculators/A&P                      Patient is 34 year old male well-known to hospital and has had several admissions for abdominal pain in the past.  He has  had normal CT scans in the past.  I dependently reviewed the CT scans.  Patient was discharged yesterday for intractable nausea vomiting abdominal pain.  Patient states that he has no Zofran at home or other nausea medication.  He states that when he feels nauseous he feels like the abdominal pain comes on.  He states that this feels similar to prior episodes of pain that he has had in the past.  He is requesting nausea medication and pain medication.  Physical exam unremarkable.  Patient has mildly dry mucous membranes.  CBC with significant leukocytosis of 18.4 however this has been significantly higher during his last hospitalization when it was 24.  He also had elevated hemoglobin consistent with dehydration.  I suspect that his leukocytosis is secondary to vomiting as well as is a chronic issue for him.  He is afebrile not tachycardic, not hypotensive and well-appearing.  Doubt infectious cause of leukocytosis.  CMP is notable for BUN and creatinine at baseline for patient.  He has mild anion gap of 16 however he has had significant anion gap in the past and it is noted is his chronic medical additions that he has had metabolic acidosis with anion gap in the past.  I suspect that this is likely secondary to dehydration.  His lipase is within normal limits although he may have chronic pancreatitis causing his symptoms today I doubt acute on chronic or acute pancreatitis.  Patient provided 1 dose of Dilaudid, Zofran and 1 L normal saline.  His symptoms completely resolved and he states he is feeling sniffily better and is tolerating p.o. without difficulty repeat abdominal exam continues to be benign.  As he has had multiple negative CT scans in the past we will have patient follow-up with gastroenterology and recommend marijuana cessation although he denies any use today/recently.  I discussed this case with my attending physician  who cosigned this note including patient's presenting symptoms,  physical exam, and planned diagnostics and interventions. Attending physician stated agreement with plan or made changes to plan which were implemented.   Patient discharged with Bentyl and Zofran.  He will return to ED if he has new or concerning symptoms.  He will otherwise will follow up with GI.  Final Clinical Impression(s) / ED Diagnoses Final diagnoses:  Generalized abdominal pain    Rx / DC Orders ED Discharge Orders         Ordered    dicyclomine (BENTYL) 20 MG tablet  2 times daily     01/15/20 1621    ondansetron (ZOFRAN ODT) 4 MG disintegrating tablet  Every 8 hours PRN     01/15/20 1621           Gailen Shelter, [place] 01/15/20 1707    Pollyann Savoy, MD 01/15/20 1950

## 2020-01-15 NOTE — ED Notes (Signed)
PO fluids tolerated well. No emesis.

## 2020-01-15 NOTE — ED Notes (Signed)
Pt ambulatory with steady gait to restroom 

## 2020-01-15 NOTE — ED Triage Notes (Signed)
Ongoing abd pain and vomiting. Was d/c yesterday after an admission for same. Also endorses constipation.

## 2020-01-15 NOTE — Discharge Instructions (Signed)
Please use Zofran as needed for nausea.  Please use Bentyl for pain.  This can help with abdominal cramping.

## 2020-01-15 NOTE — ED Notes (Addendum)
Pt requesting to be seen by EDP, reports upper abdominal pain and requesting water or ice

## 2020-03-23 ENCOUNTER — Emergency Department (HOSPITAL_BASED_OUTPATIENT_CLINIC_OR_DEPARTMENT_OTHER)
Admission: EM | Admit: 2020-03-23 | Discharge: 2020-03-23 | Disposition: A | Discharge status: Home / Self Care | Payer: PRIVATE HEALTH INSURANCE | Attending: Emergency Medicine | Admitting: Emergency Medicine

## 2020-03-23 ENCOUNTER — Encounter (HOSPITAL_BASED_OUTPATIENT_CLINIC_OR_DEPARTMENT_OTHER): Payer: Self-pay | Admitting: Emergency Medicine

## 2020-03-23 ENCOUNTER — Other Ambulatory Visit: Payer: Self-pay

## 2020-03-23 DIAGNOSIS — R112 Nausea with vomiting, unspecified: Secondary | ICD-10-CM | POA: Insufficient documentation

## 2020-03-23 DIAGNOSIS — F1290 Cannabis use, unspecified, uncomplicated: Secondary | ICD-10-CM | POA: Diagnosis not present

## 2020-03-23 DIAGNOSIS — J45909 Unspecified asthma, uncomplicated: Secondary | ICD-10-CM | POA: Diagnosis not present

## 2020-03-23 DIAGNOSIS — F17210 Nicotine dependence, cigarettes, uncomplicated: Secondary | ICD-10-CM | POA: Insufficient documentation

## 2020-03-23 DIAGNOSIS — Z79899 Other long term (current) drug therapy: Secondary | ICD-10-CM | POA: Insufficient documentation

## 2020-03-23 DIAGNOSIS — Z21 Asymptomatic human immunodeficiency virus [HIV] infection status: Secondary | ICD-10-CM | POA: Insufficient documentation

## 2020-03-23 LAB — COMPREHENSIVE METABOLIC PANEL
ALT: 23 U/L (ref 0–44)
AST: 21 U/L (ref 15–41)
Albumin: 5 g/dL (ref 3.5–5.0)
Alkaline Phosphatase: 65 U/L (ref 38–126)
Anion gap: 17 — ABNORMAL HIGH (ref 5–15)
BUN: 28 mg/dL — ABNORMAL HIGH (ref 6–20)
CO2: 26 mmol/L (ref 22–32)
Calcium: 9.9 mg/dL (ref 8.9–10.3)
Chloride: 92 mmol/L — ABNORMAL LOW (ref 98–111)
Creatinine, Ser: 1.64 mg/dL — ABNORMAL HIGH (ref 0.61–1.24)
GFR calc Af Amer: >60 mL/min (ref >60)
GFR calc non Af Amer: 53 mL/min — ABNORMAL LOW (ref >60)
Glucose, Bld: 144 mg/dL — ABNORMAL HIGH (ref 70–99)
Potassium: 3.6 mmol/L (ref 3.5–5.1)
Sodium: 135 mmol/L (ref 135–145)
Total Bilirubin: 1.4 mg/dL — ABNORMAL HIGH (ref 0.3–1.2)
Total Protein: 9.5 g/dL — ABNORMAL HIGH (ref 6.5–8.1)

## 2020-03-23 LAB — CBC WITH DIFFERENTIAL/PLATELET
Abs Immature Granulocytes: 0.12 10*3/uL — ABNORMAL HIGH (ref 0.00–0.07)
Basophils Absolute: 0.1 10*3/uL (ref 0.0–0.1)
Basophils Relative: 0 %
Eosinophils Absolute: 0 10*3/uL (ref 0.0–0.5)
Eosinophils Relative: 0 %
HCT: 55.2 % — ABNORMAL HIGH (ref 39.0–52.0)
Hemoglobin: 19.2 g/dL — ABNORMAL HIGH (ref 13.0–17.0)
Immature Granulocytes: 1 %
Lymphocytes Relative: 12 %
Lymphs Abs: 2.5 10*3/uL (ref 0.7–4.0)
MCH: 31.1 pg (ref 26.0–34.0)
MCHC: 34.8 g/dL (ref 30.0–36.0)
MCV: 89.5 fL (ref 80.0–100.0)
Monocytes Absolute: 1 10*3/uL (ref 0.1–1.0)
Monocytes Relative: 5 %
Neutro Abs: 16.9 10*3/uL — ABNORMAL HIGH (ref 1.7–7.7)
Neutrophils Relative %: 82 %
Platelets: 354 10*3/uL (ref 150–400)
RBC: 6.17 MIL/uL — ABNORMAL HIGH (ref 4.22–5.81)
RDW: 13.2 % (ref 11.5–15.5)
WBC: 20.6 10*3/uL — ABNORMAL HIGH (ref 4.0–10.5)
nRBC: 0 % (ref 0.0–0.2)

## 2020-03-23 LAB — LIPASE, BLOOD: Lipase: 29 U/L (ref 11–51)

## 2020-03-23 MED ORDER — ONDANSETRON 4 MG PO TBDP
4.0000 mg | ORAL_TABLET | Freq: Three times a day (TID) | ORAL | 0 refills | Status: DC | PRN
Start: 2020-03-23 — End: 2021-01-04

## 2020-03-23 MED ORDER — LIDOCAINE HCL 2 % IJ SOLN: 10.0000 mL | Freq: Once | INTRAMUSCULAR | Status: DC

## 2020-03-23 MED ORDER — HALOPERIDOL LACTATE 5 MG/ML IJ SOLN
5.0000 mg | Freq: Once | INTRAMUSCULAR | Status: AC
  Administered 2020-03-23: 5 mg via INTRAVENOUS
  Filled 2020-03-23: qty 1

## 2020-03-23 MED ORDER — SODIUM CHLORIDE 0.9 % IV BOLUS
1000.0000 mL | Freq: Once | INTRAVENOUS | Status: AC
  Administered 2020-03-23: 1000 mL via INTRAVENOUS

## 2020-03-23 MED ORDER — ONDANSETRON HCL 4 MG/2ML IJ SOLN
4.0000 mg | Freq: Once | INTRAMUSCULAR | Status: AC
  Administered 2020-03-23: 4 mg via INTRAVENOUS
  Filled 2020-03-23: qty 2

## 2020-03-23 NOTE — ED Notes (Signed)
Ginger Ale provided for po challenge 

## 2020-03-23 NOTE — ED Provider Notes (Signed)
[facility] EMERGENCY DEPARTMENT Provider Note  CSN: [id number] Arrival date & time: 03/23/20 6384    History Chief Complaint  Patient presents with  . Abdominal Pain    HPI  Carlos Guzman is a 34 y.o. male here for evaluation of abdominal pain. He has a history of frequent ED visits for abdominal pain/vomiting felt to be either chronic pancreatitis from EtOH use or cannabinoid hyperemesis syndrome. He reports continued occasional EtOH use and frequent marijuana use because it 'helps his appetite' despite numerous times being told this is the likely cause of his abdominal symptoms. He has been sick with this episode for 5 days, states his mother made him come to the ED. Limited PO intake during that time. No fevers, no blood in emesis, no diarrhea. He has well controlled HIV. At previous visits he has been noted to have chronically elevated WBC. Also frequently gets admitted for AKI   Past Medical History:  Diagnosis Date  . Asthma   . HIV (human immunodeficiency virus infection) (HCC)   . Pancreatitis     Past Surgical History:  Procedure Laterality Date  . NO PAST SURGERIES      Family History  Problem Relation Age of Onset  . CAD Neg Hx   . Diabetes Neg Hx     Social History   Tobacco Use  . Smoking status: Current Every Day Smoker    Packs/day: 1.00    Types: Cigarettes  . Smokeless tobacco: Never Used  Vaping Use  . Vaping Use: Never used  Substance Use Topics  . Alcohol use: Yes    Comment: occ  . Drug use: Yes    Types: Marijuana     Home Medications Prior to Admission medications   Medication Sig Start Date End Date Taking? Authorizing Provider  bictegravir-emtricitabine-tenofovir AF (BIKTARVY) 50-200-25 MG TABS tablet Take 1 tablet by mouth daily.  04/03/19   [provider]  capsaicin (ZOSTRIX) 0.025 % cream Apply topically 2 (two) times daily. Patient not taking: Reported on 01/14/2020 07/30/19   Noralee Stain, DO  dicyclomine  (BENTYL) 20 MG tablet Take 1 tablet (20 mg total) by mouth 2 (two) times daily. 01/15/20   Gailen Shelter, PA  metoCLOPramide (REGLAN) 10 MG tablet Take 1 tablet (10 mg total) by mouth every 6 (six) hours as needed for nausea or vomiting. Patient not taking: Reported on 01/14/2020 06/10/19   Molpus, John, MD  omeprazole (PRILOSEC) 20 MG capsule Take 1 capsule (20 mg total) by mouth daily. Patient taking differently: Take 20 mg by mouth daily as needed (reflux).  07/25/19   Robinson, [place] N, PA-C  ondansetron (ZOFRAN ODT) 4 MG disintegrating tablet Take 1 tablet (4 mg total) by mouth every 8 (eight) hours as needed for nausea or vomiting. 03/23/20   Pollyann Savoy, MD  famotidine (PEPCID) 20 MG tablet Take 1 tablet (20 mg total) by mouth 2 (two) times daily. 10/11/17 06/10/19  Elson Areas, PA-C  sucralfate (CARAFATE) 1 g tablet Take 1 tablet (1 g total) by mouth 4 (four) times daily -  with meals and at bedtime. 06/10/19 06/10/19  Molpus, Jonny Ruiz, MD     Allergies    Patient has no known allergies.   Review of Systems   Review of Systems A comprehensive review of systems was completed and negative except as noted in HPI.    Physical Exam BP (!) 159/102 (BP Location: Left Arm)   Pulse (!) 102   Temp 97.8 F (  36.6 C) (Oral)   Resp 20   Ht 6\' 1"  (1.854 m)   Wt 77.1 kg   SpO2 98%   BMI 22.43 kg/m   Physical Exam Vitals and nursing note reviewed.  Constitutional:      Appearance: Normal appearance.  HENT:     Head: Normocephalic and atraumatic.     Nose: Nose normal.     Mouth/Throat:     Mouth: Mucous membranes are moist.  Eyes:     Extraocular Movements: Extraocular movements intact.     Conjunctiva/sclera: Conjunctivae normal.  Cardiovascular:     Rate and Rhythm: Normal rate.  Pulmonary:     Effort: Pulmonary effort is normal.     Breath sounds: Normal breath sounds.  Abdominal:     General: Abdomen is flat.     Palpations: Abdomen is soft.     Tenderness: There is  abdominal tenderness in the epigastric area. There is no guarding. Negative signs include Murphy's sign.  Musculoskeletal:        General: No swelling. Normal range of motion.     Cervical back: Neck supple.  Skin:    General: Skin is warm and dry.  Neurological:     General: No focal deficit present.     Mental Status: He is alert.  Psychiatric:        Mood and Affect: Mood normal.      ED Results / Procedures / Treatments   Labs (all labs ordered are listed, but only abnormal results are displayed) Labs Reviewed  COMPREHENSIVE METABOLIC PANEL - Abnormal; Notable for the following components:      Result Value   Chloride 92 (*)    Glucose, Bld 144 (*)    BUN 28 (*)    Creatinine, Ser 1.64 (*)    Total Protein 9.5 (*)    Total Bilirubin 1.4 (*)    GFR calc non Af Amer 53 (*)    Anion gap 17 (*)    All other components within normal limits  CBC WITH DIFFERENTIAL/PLATELET - Abnormal; Notable for the following components:   WBC 20.6 (*)    RBC 6.17 (*)    Hemoglobin 19.2 (*)    HCT 55.2 (*)    Neutro Abs 16.9 (*)    Abs Immature Granulocytes 0.12 (*)    All other components within normal limits  LIPASE, BLOOD    EKG None   Radiology No results found.  Procedures Procedures  Medications Ordered in the ED Medications  sodium chloride 0.9 % bolus 1,000 mL ( Intravenous Stopped 03/23/20 1121)  ondansetron (ZOFRAN) injection 4 mg (4 mg Intravenous Given 03/23/20 1019)  haloperidol lactate (HALDOL) injection 5 mg (5 mg Intravenous Given 03/23/20 1020)     MDM Rules/Calculators/A&P MDM Patient with recurrence of vomiting. No peritoneal signs on exam. Will give Zofran, Haldol and IVF. Patient advised narcotics are not indicated. Check labs and reassess.   ED Course  I have reviewed the triage vital signs and the nursing notes.  Pertinent labs & imaging results that were available during my care of the patient were reviewed by me and considered in my medical  decision making (see chart for details).  Clinical Course as of Mar 23 1202  Thu Mar 23, 2020  1049 CBC shows moderate leukocytosis, similar to previous.    [CS]  1112 CMP shows creatinine about a baseline. Lipase is normal.   [CS]  1122 Patient reports he is feeling 'way better'. Will attempt PO trial  and then likely discharge.    [CS]    Clinical Course User Index [CS] Pollyann Savoy, MD    Final Clinical Impression(s) / ED Diagnoses Final diagnoses:  Cannabinoid hyperemesis syndrome    Rx / DC Orders ED Discharge Orders         Ordered    ondansetron (ZOFRAN ODT) 4 MG disintegrating tablet  Every 8 hours PRN     Discontinue  Reprint     03/23/20 1202           Pollyann Savoy, MD 03/23/20 1203

## 2020-03-23 NOTE — ED Triage Notes (Signed)
Upper abd pain with N/v x 2 days. Hx of gastritis.

## 2020-03-24 ENCOUNTER — Emergency Department (HOSPITAL_BASED_OUTPATIENT_CLINIC_OR_DEPARTMENT_OTHER)
Admission: EM | Admit: 2020-03-24 | Discharge: 2020-03-24 | Disposition: A | Discharge status: Home / Self Care | Payer: PRIVATE HEALTH INSURANCE | Attending: Emergency Medicine | Admitting: Emergency Medicine

## 2020-03-24 ENCOUNTER — Other Ambulatory Visit: Payer: Self-pay

## 2020-03-24 ENCOUNTER — Encounter (HOSPITAL_BASED_OUTPATIENT_CLINIC_OR_DEPARTMENT_OTHER): Payer: Self-pay | Admitting: *Deleted

## 2020-03-24 DIAGNOSIS — J45909 Unspecified asthma, uncomplicated: Secondary | ICD-10-CM | POA: Insufficient documentation

## 2020-03-24 DIAGNOSIS — R109 Unspecified abdominal pain: Secondary | ICD-10-CM | POA: Diagnosis present

## 2020-03-24 DIAGNOSIS — R1115 Cyclical vomiting syndrome unrelated to migraine: Secondary | ICD-10-CM | POA: Diagnosis not present

## 2020-03-24 DIAGNOSIS — Z79899 Other long term (current) drug therapy: Secondary | ICD-10-CM | POA: Diagnosis not present

## 2020-03-24 DIAGNOSIS — F1290 Cannabis use, unspecified, uncomplicated: Secondary | ICD-10-CM

## 2020-03-24 DIAGNOSIS — F17210 Nicotine dependence, cigarettes, uncomplicated: Secondary | ICD-10-CM | POA: Insufficient documentation

## 2020-03-24 DIAGNOSIS — R1013 Epigastric pain: Secondary | ICD-10-CM | POA: Insufficient documentation

## 2020-03-24 LAB — CBC WITH DIFFERENTIAL/PLATELET
Abs Immature Granulocytes: 0.08 10*3/uL — ABNORMAL HIGH (ref 0.00–0.07)
Basophils Absolute: 0.1 10*3/uL (ref 0.0–0.1)
Basophils Relative: 0 %
Eosinophils Absolute: 0 10*3/uL (ref 0.0–0.5)
Eosinophils Relative: 0 %
HCT: 53.3 % — ABNORMAL HIGH (ref 39.0–52.0)
Hemoglobin: 18.5 g/dL — ABNORMAL HIGH (ref 13.0–17.0)
Immature Granulocytes: 0 %
Lymphocytes Relative: 9 %
Lymphs Abs: 1.8 10*3/uL (ref 0.7–4.0)
MCH: 31 pg (ref 26.0–34.0)
MCHC: 34.7 g/dL (ref 30.0–36.0)
MCV: 89.4 fL (ref 80.0–100.0)
Monocytes Absolute: 0.7 10*3/uL (ref 0.1–1.0)
Monocytes Relative: 3 %
Neutro Abs: 17.3 10*3/uL — ABNORMAL HIGH (ref 1.7–7.7)
Neutrophils Relative %: 88 %
Platelets: 322 10*3/uL (ref 150–400)
RBC: 5.96 MIL/uL — ABNORMAL HIGH (ref 4.22–5.81)
RDW: 12.9 % (ref 11.5–15.5)
WBC: 19.9 10*3/uL — ABNORMAL HIGH (ref 4.0–10.5)
nRBC: 0 % (ref 0.0–0.2)

## 2020-03-24 LAB — COMPREHENSIVE METABOLIC PANEL
ALT: 21 U/L (ref 0–44)
AST: 23 U/L (ref 15–41)
Albumin: 4.6 g/dL (ref 3.5–5.0)
Alkaline Phosphatase: 58 U/L (ref 38–126)
Anion gap: 15 (ref 5–15)
BUN: 20 mg/dL (ref 6–20)
CO2: 26 mmol/L (ref 22–32)
Calcium: 9.5 mg/dL (ref 8.9–10.3)
Chloride: 92 mmol/L — ABNORMAL LOW (ref 98–111)
Creatinine, Ser: 1.36 mg/dL — ABNORMAL HIGH (ref 0.61–1.24)
GFR calc Af Amer: >60 mL/min (ref >60)
GFR calc non Af Amer: >60 mL/min (ref >60)
Glucose, Bld: 140 mg/dL — ABNORMAL HIGH (ref 70–99)
Potassium: 3.2 mmol/L — ABNORMAL LOW (ref 3.5–5.1)
Sodium: 133 mmol/L — ABNORMAL LOW (ref 135–145)
Total Bilirubin: 1.2 mg/dL (ref 0.3–1.2)
Total Protein: 8.3 g/dL — ABNORMAL HIGH (ref 6.5–8.1)

## 2020-03-24 LAB — LIPASE, BLOOD: Lipase: 27 U/L (ref 11–51)

## 2020-03-24 MED ORDER — MAGNESIUM SULFATE 2 GM/50ML IV SOLN
2.0000 g | Freq: Once | INTRAVENOUS | Status: AC
  Administered 2020-03-24: 2 g via INTRAVENOUS
  Filled 2020-03-24: qty 50

## 2020-03-24 MED ORDER — HALOPERIDOL LACTATE 5 MG/ML IJ SOLN
2.0000 mg | Freq: Once | INTRAMUSCULAR | Status: AC
  Administered 2020-03-24: 2 mg via INTRAVENOUS
  Filled 2020-03-24: qty 1

## 2020-03-24 MED ORDER — POTASSIUM CHLORIDE CRYS ER 20 MEQ PO TBCR
40.0000 meq | EXTENDED_RELEASE_TABLET | Freq: Once | ORAL | Status: AC
  Administered 2020-03-24: 40 meq via ORAL
  Filled 2020-03-24: qty 2

## 2020-03-24 MED ORDER — ACETAMINOPHEN ER 650 MG PO TBCR
650.0000 mg | EXTENDED_RELEASE_TABLET | Freq: Three times a day (TID) | ORAL | 0 refills | Status: DC | PRN
Start: 2020-03-24 — End: 2020-11-17

## 2020-03-24 MED ORDER — SODIUM CHLORIDE 0.9 % IV BOLUS
1000.0000 mL | Freq: Once | INTRAVENOUS | Status: AC
  Administered 2020-03-24: 1000 mL via INTRAVENOUS

## 2020-03-24 NOTE — ED Provider Notes (Signed)
[facility] EMERGENCY DEPARTMENT Provider Note   CSN: [id number] Arrival date & time: 03/24/20  1519     History Chief Complaint  Patient presents with  . Abdominal Pain    Carlos Guzman is a 34 y.o. male with past medical history of pancreatitis, asthma, cannabinoid hyperemesis who presents today for evaluation of abdominal pain.  He was seen here yesterday for this for his cannabinoid hyperemesis, treated with Haldol after which his symptoms resolved and he was able to go home.  He reports that he was fine.  Early this morning he ate a hamburger which caused him to vomit, he was able to tolerate p.o. intake after that.  He then ate Ramen and had stomach pain mostly upper abdominal with vomiting.  He tells me he is unable to count the number of times that he vomited daily so high.  He denies any fevers or back pain.  Chart review shows that his HIV is usually well controlled.    He also has a history of alcohol abuse.  He reports that he has not had any alcohol recently and no cannabis use since yesterday.  HPI     Past Medical History:  Diagnosis Date  . Asthma   . HIV (human immunodeficiency virus infection) (HCC)   . Pancreatitis     Patient Active Problem List   Diagnosis Date Noted  . ARF (acute renal failure) (HCC) 07/29/2019  . Hyperkalemia 07/29/2019  . Left upper quadrant abdominal pain 07/29/2019  . Metabolic acidosis, increased anion gap 07/29/2019  . Hyperbilirubinemia 07/29/2019  . Hyponatremia 07/29/2019  . Rhabdomyolysis 07/24/2019  . AKI (acute kidney injury) (HCC) 07/23/2019  . HIV (human immunodeficiency virus infection) (HCC) 07/23/2019  . Cannabinoid hyperemesis syndrome 07/23/2019  . Hypokalemia 07/23/2019  . Leukocytosis 07/23/2019  . Tobacco abuse 07/23/2019  . 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 NO PAST SURGERIES         Family History  Problem Relation Age of Onset  .  CAD Neg Hx   . Diabetes Neg Hx     Social History   Tobacco Use  . Smoking status: Current Every Day Smoker    Packs/day: 1.00    Types: Cigarettes  . Smokeless tobacco: Never Used  Vaping Use  . Vaping Use: Never used  Substance Use Topics  . Alcohol use: Yes    Comment: occ  . Drug use: Yes    Types: Marijuana    Home Medications Prior to Admission medications   Medication Sig Start Date End Date Taking? Authorizing Provider  bictegravir-emtricitabine-tenofovir AF (BIKTARVY) 50-200-25 MG TABS tablet Take 1 tablet by mouth daily.  04/03/19   [provider]  capsaicin (ZOSTRIX) 0.025 % cream Apply topically 2 (two) times daily. Patient not taking: Reported on 01/14/2020 07/30/19   Noralee Stain, DO  dicyclomine (BENTYL) 20 MG tablet Take 1 tablet (20 mg total) by mouth 2 (two) times daily. 01/15/20   Gailen Shelter, PA  metoCLOPramide (REGLAN) 10 MG tablet Take 1 tablet (10 mg total) by mouth every 6 (six) hours as needed for nausea or vomiting. Patient not taking: Reported on 01/14/2020 06/10/19   Molpus, John, MD  omeprazole (PRILOSEC) 20 MG capsule Take 1 capsule (20 mg total) by mouth daily. Patient taking differently: Take 20 mg by mouth daily as needed (reflux).  07/25/19   Robinson, [place] N, PA-C  ondansetron (ZOFRAN ODT) 4 MG disintegrating tablet Take  1 tablet (4 mg total) by mouth every 8 (eight) hours as needed for nausea or vomiting. 03/23/20   Pollyann Savoy, MD  famotidine (PEPCID) 20 MG tablet Take 1 tablet (20 mg total) by mouth 2 (two) times daily. 10/11/17 06/10/19  Elson Areas, PA-C  sucralfate (CARAFATE) 1 g tablet Take 1 tablet (1 g total) by mouth 4 (four) times daily -  with meals and at bedtime. 06/10/19 06/10/19  Molpus, Jonny Ruiz, MD    Allergies    Patient has no known allergies.  Review of Systems   Review of Systems  Constitutional: Negative for chills and fever.  HENT: Negative for congestion.   Respiratory: Negative for cough and shortness  of breath.   Cardiovascular: Negative for chest pain.  Gastrointestinal: Positive for abdominal pain, nausea and vomiting. Negative for constipation and diarrhea.  Musculoskeletal: Negative for back pain and neck pain.  Neurological: Negative for weakness.  All other systems reviewed and are negative.   Physical Exam Updated Vital Signs BP (!) 178/88   Pulse 80   Temp 98.3 F (36.8 C) (Oral)   Resp 18   Ht 6\' 1"  (1.854 m)   Wt 77.1 kg   SpO2 100%   BMI 22.43 kg/m   Physical Exam Vitals and nursing note reviewed.  Constitutional:      General: He is not in acute distress.    Appearance: He is well-developed. He is not diaphoretic.  HENT:     Head: Normocephalic and atraumatic.     Mouth/Throat:     Mouth: Mucous membranes are moist.  Eyes:     General: No scleral icterus.       Right eye: No discharge.        Left eye: No discharge.     Conjunctiva/sclera: Conjunctivae normal.  Cardiovascular:     Rate and Rhythm: Normal rate and regular rhythm.     Heart sounds: Normal heart sounds.  Pulmonary:     Effort: Pulmonary effort is normal. No respiratory distress.     Breath sounds: No stridor.  Abdominal:     General: Abdomen is flat. There is no distension.     Palpations: Abdomen is soft.     Tenderness: There is abdominal tenderness in the epigastric area and left upper quadrant. There is no guarding or rebound.  Musculoskeletal:        General: No deformity.     Cervical back: Normal range of motion.  Skin:    General: Skin is warm and dry.  Neurological:     General: No focal deficit present.     Mental Status: He is alert.     Motor: No abnormal muscle tone.  Psychiatric:        Mood and Affect: Mood normal.        Behavior: Behavior normal.     ED Results / Procedures / Treatments   Labs (all labs ordered are listed, but only abnormal results are displayed) Labs Reviewed  CBC WITH DIFFERENTIAL/PLATELET - Abnormal; Notable for the following components:       Result Value   WBC 19.9 (*)    RBC 5.96 (*)    Hemoglobin 18.5 (*)    HCT 53.3 (*)    Neutro Abs 17.3 (*)    Abs Immature Granulocytes 0.08 (*)    All other components within normal limits  COMPREHENSIVE METABOLIC PANEL - Abnormal; Notable for the following components:   Sodium 133 (*)    Potassium 3.2 (*)  Chloride 92 (*)    Glucose, Bld 140 (*)    Creatinine, Ser 1.36 (*)    Total Protein 8.3 (*)    All other components within normal limits  LIPASE, BLOOD    EKG None  Radiology No results found.  Procedures Procedures (including critical care time)  Medications Ordered in ED Medications  sodium chloride 0.9 % bolus 1,000 mL ( Intravenous Stopped 03/24/20 1916)  magnesium sulfate IVPB 2 g 50 mL ( Intravenous Stopped 03/24/20 1916)  haloperidol lactate (HALDOL) injection 2 mg (2 mg Intravenous Given 03/24/20 1801)  potassium chloride SA (KLOR-CON) CR tablet 40 mEq (40 mEq Oral Given 03/24/20 1830)    ED Course  I have reviewed the triage vital signs and the nursing notes.  Pertinent labs & imaging results that were available during my care of the patient were reviewed by me and considered in my medical decision making (see chart for details).    MDM Rules/Calculators/A&P                         Patient is a 34 year old man who presents today for evaluation of abdominal pain nausea and vomiting.  He was seen yesterday for his cyclic vomiting/cannabinoid hyperemesis syndrome and after being treated with Haldol reports that he was much better.  He was okay and able to eat a hamburger, despite vomiting once, until he ate Ramen after which he began having similar symptoms.  Epigastric abdominal pain and vomiting.  Labs are obtained and reviewed, he has significant leukocytosis which is his baseline and improved since yesterday.  CMP shows mild hypokalemia with a potassium of 3.2, I suspect this is secondary to GI losses and p.o. replacement is ordered.  Creatinine appears  to be his baseline.  EKG without acute ischemia.  Given his hypokalemia and vomiting we will treat with magnesium, IV fluids and p.o. potassium.  He is given a small dose of IV Haldol and p.o. challenge is ordered.  At shift change care was transferred to [facility] who will follow pending studies, re-evaulate and determine disposition.     Final Clinical Impression(s) / ED Diagnoses Final diagnoses:  Cyclic vomiting syndrome  Cannabinoid hyperemesis syndrome    Rx / DC Orders ED Discharge Orders    None       Cristina Gong, PA-C 03/24/20 2000    Melene Plan, DO 03/24/20 2008

## 2020-03-24 NOTE — Discharge Instructions (Addendum)
As discussed, your labs are reassuring today.  I suspect your symptoms could be related to marijuana use.  Continue take your Zofran as prescribed yesterday.  I am sending home with Tylenol as needed for abdominal pain.  Continue to drink fluids.  I have included information on a bland diet.  I recommend eating bland foods for the next few days.  Please follow-up with PCP if symptoms not improved within the next week.  Return to the ER for new or worsening symptoms.

## 2020-03-24 NOTE — ED Provider Notes (Signed)
Care assumed Lyndel Safe, PA-C at shift change pending IV magnesium and p.o. challenge.  See her note for full HPI.  In short, patient is a 34 year old male with a past medical history significant for pancreatitis, asthma, and cannabinoid hyperemesis who presents to the ED due to abdominal pain associated with emesis.  Patient notes he was unable to tolerate p.o. which caused him to report back to the ED.  Patient was seen yesterday for cannabinoid hyperemesis and treated with Haldol with symptomatic relief.  She admits to numerous episodes of nonbloody, nonbilious emesis today after eating Ramen.  He notes his abdominal pain is in the upper region.   Physical Exam  BP (!) 173/95   Pulse 80   Temp 98.3 F (36.8 C) (Oral)   Resp 16   Ht 6\' 1"  (1.854 m)   Wt 77.1 kg   SpO2 100%   BMI 22.43 kg/m   Physical Exam Vitals and nursing note reviewed.  Constitutional:      General: He is not in acute distress.    Appearance: He is not ill-appearing.  HENT:     Head: Normocephalic.  Eyes:     Pupils: Pupils are equal, round, and reactive to light.  Cardiovascular:     Rate and Rhythm: Normal rate and regular rhythm.     Pulses: Normal pulses.     Heart sounds: Normal heart sounds. No murmur heard.  No friction rub. No gallop.   Pulmonary:     Effort: Pulmonary effort is normal.     Breath sounds: Normal breath sounds.  Abdominal:     General: Abdomen is flat. Bowel sounds are normal. There is no distension.     Palpations: Abdomen is soft.     Tenderness: There is no abdominal tenderness. There is no guarding or rebound.  Musculoskeletal:     Cervical back: Neck supple.     Comments: Able to move all 4 extremities without difficulty.   Skin:    General: Skin is warm and dry.  Neurological:     General: No focal deficit present.     Mental Status: He is alert.  Psychiatric:        Mood and Affect: Mood normal.        Behavior: Behavior normal.     ED Course/Procedures     Results for orders placed or performed during the hospital encounter of 03/24/20 (from the past 24 hour(s))  CBC with Differential     Status: Abnormal   Collection Time: 03/24/20  5:37 PM  Result Value Ref Range   WBC 19.9 (H) 4.0 - 10.5 K/uL   RBC 5.96 (H) 4.22 - 5.81 MIL/uL   Hemoglobin 18.5 (H) 13.0 - 17.0 g/dL   HCT 03/26/20 (H) 39 - 52 %   MCV 89.4 80.0 - 100.0 fL   MCH 31.0 26.0 - 34.0 pg   MCHC 34.7 30.0 - 36.0 g/dL   RDW 16.1 09.6 - 04.5 %   Platelets 322 150 - 400 K/uL   nRBC 0.0 0.0 - 0.2 %   Neutrophils Relative % 88 %   Neutro Abs 17.3 (H) 1.7 - 7.7 K/uL   Lymphocytes Relative 9 %   Lymphs Abs 1.8 0.7 - 4.0 K/uL   Monocytes Relative 3 %   Monocytes Absolute 0.7 0 - 1 K/uL   Eosinophils Relative 0 %   Eosinophils Absolute 0.0 0 - 0 K/uL   Basophils Relative 0 %   Basophils Absolute 0.1 0 -  0 K/uL   Immature Granulocytes 0 %   Abs Immature Granulocytes 0.08 (H) 0.00 - 0.07 K/uL  Comprehensive metabolic panel     Status: Abnormal   Collection Time: 03/24/20  5:37 PM  Result Value Ref Range   Sodium 133 (L) 135 - 145 mmol/L   Potassium 3.2 (L) 3.5 - 5.1 mmol/L   Chloride 92 (L) 98 - 111 mmol/L   CO2 26 22 - 32 mmol/L   Glucose, Bld 140 (H) 70 - 99 mg/dL   BUN 20 6 - 20 mg/dL   Creatinine, Ser 1.61 (H) 0.61 - 1.24 mg/dL   Calcium 9.5 8.9 - 09.6 mg/dL   Total Protein 8.3 (H) 6.5 - 8.1 g/dL   Albumin 4.6 3.5 - 5.0 g/dL   AST 23 15 - 41 U/L   ALT 21 0 - 44 U/L   Alkaline Phosphatase 58 38 - 126 U/L   Total Bilirubin 1.2 0.3 - 1.2 mg/dL   GFR calc non Af Amer >60 >60 mL/min   GFR calc Af Amer >60 >60 mL/min   Anion gap 15 5 - 15  Lipase, blood     Status: None   Collection Time: 03/24/20  5:37 PM  Result Value Ref Range   Lipase 27 11 - 51 U/L    Procedures  MDM  Care assumed Lyndel Safe, PA-C at shift change pending IV magnesium and p.o. challenge.  See her note for full MDM.  34 year old male presents to the ED due to abdominal pain associate with  nausea vomiting.  Patient was seen yesterday for cyclic vomiting/cannabinoid hyperemesis syndrome and treated with Haldol with symptomatic relief.  He admits to eating Ramen today causing numerous episodes of nonbloody, nonbilious emesis.  Upon arrival, patient afebrile, not tachycardic or hypoxic.  BP elevated 153/100.  Low suspicion for hypertensive urgency/emergency.  Blood pressure appears to be chronically elevated.  Patient given IV fluids, potassium, Haldol, and magnesium by previous provider.  CBC significant for leukocytosis at 19.9 which appears to be chronically elevated.  CMP significant for hyponatremia at 133, hypokalemia at 3.2, elevated creatinine at 1.36, and hyperglycemia at 140 with no anion gap.  EKG personally reviewed which demonstrates normal sinus rhythm with no signs of acute ischemia.  Patient remote 44.  Doubt pancreatitis.  8:28 PM reassessed patient at bedside who notes improvement in symptoms.  Patient able to tolerate p.o. here in the ED without difficulty.  Abdomen soft, nondistended, nontender.  Low suspicion for acute abdomen.  Patient notes he is ready to be discharged home.  Patient already has a prescription for Zofran.  Will also discharge patient with Tylenol as needed for abdominal pain.  Treated patient with oral hydration.  Bland diet information given to patient at discharge.  Advised patient to stop marijuana use. Strict ED precautions discussed with patient. Patient states understanding and agrees to plan. Patient discharged home in no acute distress and stable vitals.       Jesusita Oka 03/24/20 2037    Melene Plan, DO 03/24/20 2308

## 2020-03-24 NOTE — ED Notes (Signed)
Pt tolerated po fluids, no emesis

## 2020-03-24 NOTE — ED Triage Notes (Signed)
C/o cont abd pain with vomiting after cannabis use , seen here yesterday for same

## 2020-09-07 ENCOUNTER — Encounter (HOSPITAL_BASED_OUTPATIENT_CLINIC_OR_DEPARTMENT_OTHER): Payer: Self-pay | Admitting: *Deleted

## 2020-09-07 ENCOUNTER — Emergency Department (HOSPITAL_BASED_OUTPATIENT_CLINIC_OR_DEPARTMENT_OTHER)
Admission: EM | Admit: 2020-09-07 | Discharge: 2020-09-07 | Disposition: A | Discharge status: Home / Self Care | Payer: PRIVATE HEALTH INSURANCE | Attending: Emergency Medicine | Admitting: Emergency Medicine

## 2020-09-07 ENCOUNTER — Other Ambulatory Visit: Payer: Self-pay

## 2020-09-07 DIAGNOSIS — R109 Unspecified abdominal pain: Secondary | ICD-10-CM | POA: Insufficient documentation

## 2020-09-07 DIAGNOSIS — Z5321 Procedure and treatment not carried out due to patient leaving prior to being seen by health care provider: Secondary | ICD-10-CM | POA: Insufficient documentation

## 2020-09-07 DIAGNOSIS — R110 Nausea: Secondary | ICD-10-CM | POA: Diagnosis not present

## 2020-09-07 MED ORDER — ONDANSETRON 4 MG PO TBDP
ORAL_TABLET | ORAL | Status: AC
  Filled 2020-09-07: qty 1

## 2020-09-07 MED ORDER — CAPSAICIN 0.075 % EX CREA
TOPICAL_CREAM | CUTANEOUS | Status: AC
  Filled 2020-09-07: qty 60

## 2020-09-07 MED ORDER — ONDANSETRON 4 MG PO TBDP
4.0000 mg | ORAL_TABLET | Freq: Once | ORAL | Status: AC
  Administered 2020-09-07: 4 mg via ORAL

## 2020-09-07 NOTE — ED Triage Notes (Addendum)
C/o abd pain with nausea  X 12 hrs , reports  cannabis user

## 2020-09-10 ENCOUNTER — Encounter (HOSPITAL_BASED_OUTPATIENT_CLINIC_OR_DEPARTMENT_OTHER): Payer: Self-pay | Admitting: Emergency Medicine

## 2020-09-10 ENCOUNTER — Other Ambulatory Visit: Payer: Self-pay

## 2020-09-10 ENCOUNTER — Emergency Department (HOSPITAL_BASED_OUTPATIENT_CLINIC_OR_DEPARTMENT_OTHER)
Admission: EM | Admit: 2020-09-10 | Discharge: 2020-09-10 | Disposition: A | Discharge status: Home / Self Care | Payer: PRIVATE HEALTH INSURANCE | Attending: Emergency Medicine | Admitting: Emergency Medicine

## 2020-09-10 DIAGNOSIS — Z5321 Procedure and treatment not carried out due to patient leaving prior to being seen by health care provider: Secondary | ICD-10-CM | POA: Diagnosis not present

## 2020-09-10 DIAGNOSIS — K920 Hematemesis: Secondary | ICD-10-CM | POA: Insufficient documentation

## 2020-09-10 LAB — COMPREHENSIVE METABOLIC PANEL
ALT: 21 U/L (ref 0–44)
AST: 32 U/L (ref 15–41)
Albumin: 5.5 g/dL — ABNORMAL HIGH (ref 3.5–5.0)
Alkaline Phosphatase: 66 U/L (ref 38–126)
Anion gap: 22 — ABNORMAL HIGH (ref 5–15)
BUN: 67 mg/dL — ABNORMAL HIGH (ref 6–20)
CO2: 22 mmol/L (ref 22–32)
Calcium: 10 mg/dL (ref 8.9–10.3)
Chloride: 85 mmol/L — ABNORMAL LOW (ref 98–111)
Creatinine, Ser: 3.95 mg/dL — ABNORMAL HIGH (ref 0.61–1.24)
GFR, Estimated: 19 mL/min — ABNORMAL LOW (ref >60)
Glucose, Bld: 171 mg/dL — ABNORMAL HIGH (ref 70–99)
Potassium: 3.4 mmol/L — ABNORMAL LOW (ref 3.5–5.1)
Sodium: 129 mmol/L — ABNORMAL LOW (ref 135–145)
Total Bilirubin: 1.2 mg/dL (ref 0.3–1.2)
Total Protein: 10.4 g/dL — ABNORMAL HIGH (ref 6.5–8.1)

## 2020-09-10 LAB — CBC
HCT: 59.6 % — ABNORMAL HIGH (ref 39.0–52.0)
Hemoglobin: 21.2 g/dL (ref 13.0–17.0)
MCH: 30.8 pg (ref 26.0–34.0)
MCHC: 35.6 g/dL (ref 30.0–36.0)
MCV: 86.5 fL (ref 80.0–100.0)
Platelets: 366 10*3/uL (ref 150–400)
RBC: 6.89 MIL/uL — ABNORMAL HIGH (ref 4.22–5.81)
RDW: 13.1 % (ref 11.5–15.5)
WBC: 24.3 10*3/uL — ABNORMAL HIGH (ref 4.0–10.5)
nRBC: 0 % (ref 0.0–0.2)

## 2020-09-10 LAB — LIPASE, BLOOD: Lipase: 32 U/L (ref 11–51)

## 2020-09-10 MED ORDER — ONDANSETRON 4 MG PO TBDP
4.0000 mg | ORAL_TABLET | Freq: Once | ORAL | Status: AC | PRN
  Administered 2020-09-10: 4 mg via ORAL
  Filled 2020-09-10: qty 1

## 2020-09-10 NOTE — ED Notes (Signed)
Date and time results received: 09/10/20 1545   Test:hgb Critical Value: 21.2 Name of Provider Notified: Silverio Lay  Orders Received? Or Actions Taken?: no orders given

## 2020-09-10 NOTE — ED Notes (Signed)
Pt called for 3x's but no answer.

## 2020-09-10 NOTE — ED Notes (Signed)
Attempted to call pt on cell phone number listed per Dr. Silverio Lay due to abnormal labs. That number does not belong to him.

## 2020-09-10 NOTE — ED Triage Notes (Signed)
Pt vomiting for 3 days, pt reports vomiting blood onset today. Pt unable to tolerate PO food or fluids. Pt denies exposure to others with illness. Pt is vaccinated.

## 2020-10-06 ENCOUNTER — Emergency Department (HOSPITAL_BASED_OUTPATIENT_CLINIC_OR_DEPARTMENT_OTHER)
Admission: EM | Admit: 2020-10-06 | Discharge: 2020-10-06 | Disposition: A | Discharge status: Home / Self Care | Payer: PRIVATE HEALTH INSURANCE | Attending: Emergency Medicine | Admitting: Emergency Medicine

## 2020-10-06 ENCOUNTER — Other Ambulatory Visit: Payer: Self-pay

## 2020-10-06 ENCOUNTER — Encounter (HOSPITAL_BASED_OUTPATIENT_CLINIC_OR_DEPARTMENT_OTHER): Payer: Self-pay | Admitting: Emergency Medicine

## 2020-10-06 DIAGNOSIS — R1013 Epigastric pain: Secondary | ICD-10-CM | POA: Diagnosis not present

## 2020-10-06 DIAGNOSIS — R1011 Right upper quadrant pain: Secondary | ICD-10-CM | POA: Insufficient documentation

## 2020-10-06 DIAGNOSIS — R1115 Cyclical vomiting syndrome unrelated to migraine: Secondary | ICD-10-CM | POA: Diagnosis not present

## 2020-10-06 DIAGNOSIS — J45909 Unspecified asthma, uncomplicated: Secondary | ICD-10-CM | POA: Diagnosis not present

## 2020-10-06 DIAGNOSIS — Z21 Asymptomatic human immunodeficiency virus [HIV] infection status: Secondary | ICD-10-CM | POA: Insufficient documentation

## 2020-10-06 DIAGNOSIS — Z79899 Other long term (current) drug therapy: Secondary | ICD-10-CM | POA: Diagnosis not present

## 2020-10-06 DIAGNOSIS — F17210 Nicotine dependence, cigarettes, uncomplicated: Secondary | ICD-10-CM | POA: Diagnosis not present

## 2020-10-06 DIAGNOSIS — R000 Tachycardia, unspecified: Secondary | ICD-10-CM | POA: Diagnosis not present

## 2020-10-06 DIAGNOSIS — R1012 Left upper quadrant pain: Secondary | ICD-10-CM | POA: Diagnosis not present

## 2020-10-06 DIAGNOSIS — R112 Nausea with vomiting, unspecified: Secondary | ICD-10-CM | POA: Diagnosis present

## 2020-10-06 LAB — CBC WITH DIFFERENTIAL/PLATELET
Abs Immature Granulocytes: 0.26 10*3/uL — ABNORMAL HIGH (ref 0.00–0.07)
Basophils Absolute: 0.1 10*3/uL (ref 0.0–0.1)
Basophils Relative: 1 %
Eosinophils Absolute: 0 10*3/uL (ref 0.0–0.5)
Eosinophils Relative: 0 %
HCT: 52.6 % — ABNORMAL HIGH (ref 39.0–52.0)
Hemoglobin: 18.7 g/dL — ABNORMAL HIGH (ref 13.0–17.0)
Immature Granulocytes: 1 %
Lymphocytes Relative: 11 %
Lymphs Abs: 2.3 10*3/uL (ref 0.7–4.0)
MCH: 31 pg (ref 26.0–34.0)
MCHC: 35.6 g/dL (ref 30.0–36.0)
MCV: 87.1 fL (ref 80.0–100.0)
Monocytes Absolute: 0.7 10*3/uL (ref 0.1–1.0)
Monocytes Relative: 3 %
Neutro Abs: 17 10*3/uL — ABNORMAL HIGH (ref 1.7–7.7)
Neutrophils Relative %: 84 %
Platelets: 415 10*3/uL — ABNORMAL HIGH (ref 150–400)
RBC: 6.04 MIL/uL — ABNORMAL HIGH (ref 4.22–5.81)
RDW: 12.7 % (ref 11.5–15.5)
WBC: 20.4 10*3/uL — ABNORMAL HIGH (ref 4.0–10.5)
nRBC: 0 % (ref 0.0–0.2)

## 2020-10-06 LAB — COMPREHENSIVE METABOLIC PANEL
ALT: 30 U/L (ref 0–44)
AST: 28 U/L (ref 15–41)
Albumin: 5.6 g/dL — ABNORMAL HIGH (ref 3.5–5.0)
Alkaline Phosphatase: 65 U/L (ref 38–126)
Anion gap: 19 — ABNORMAL HIGH (ref 5–15)
BUN: 33 mg/dL — ABNORMAL HIGH (ref 6–20)
CO2: 15 mmol/L — ABNORMAL LOW (ref 22–32)
Calcium: 10.8 mg/dL — ABNORMAL HIGH (ref 8.9–10.3)
Chloride: 97 mmol/L — ABNORMAL LOW (ref 98–111)
Creatinine, Ser: 2.48 mg/dL — ABNORMAL HIGH (ref 0.61–1.24)
GFR, Estimated: 34 mL/min — ABNORMAL LOW (ref >60)
Glucose, Bld: 153 mg/dL — ABNORMAL HIGH (ref 70–99)
Potassium: 3.9 mmol/L (ref 3.5–5.1)
Sodium: 131 mmol/L — ABNORMAL LOW (ref 135–145)
Total Bilirubin: 1 mg/dL (ref 0.3–1.2)
Total Protein: 10.1 g/dL — ABNORMAL HIGH (ref 6.5–8.1)

## 2020-10-06 LAB — URINALYSIS, MICROSCOPIC (REFLEX)

## 2020-10-06 LAB — URINALYSIS, ROUTINE W REFLEX MICROSCOPIC
Glucose, UA: NEGATIVE mg/dL
Ketones, ur: 15 mg/dL — AB
Leukocytes,Ua: NEGATIVE
Nitrite: NEGATIVE
Protein, ur: >300 mg/dL — AB
Specific Gravity, Urine: 1.03 (ref 1.005–1.030)
pH: 6 (ref 5.0–8.0)

## 2020-10-06 LAB — LIPASE, BLOOD: Lipase: 33 U/L (ref 11–51)

## 2020-10-06 MED ORDER — LORAZEPAM 2 MG/ML IJ SOLN
0.5000 mg | Freq: Once | INTRAMUSCULAR | Status: AC
  Administered 2020-10-06: 0.5 mg via INTRAVENOUS
  Filled 2020-10-06: qty 1

## 2020-10-06 MED ORDER — HALOPERIDOL LACTATE 5 MG/ML IJ SOLN
2.0000 mg | Freq: Once | INTRAMUSCULAR | Status: AC
  Administered 2020-10-06: 2 mg via INTRAVENOUS
  Filled 2020-10-06: qty 1

## 2020-10-06 MED ORDER — SODIUM CHLORIDE 0.9 % IV BOLUS
1000.0000 mL | Freq: Once | INTRAVENOUS | Status: AC
  Administered 2020-10-06: 1000 mL via INTRAVENOUS

## 2020-10-06 MED ORDER — DIPHENHYDRAMINE HCL 50 MG/ML IJ SOLN
25.0000 mg | Freq: Once | INTRAMUSCULAR | Status: AC
  Administered 2020-10-06: 25 mg via INTRAVENOUS
  Filled 2020-10-06: qty 1

## 2020-10-06 MED ORDER — METOCLOPRAMIDE HCL 5 MG/ML IJ SOLN
10.0000 mg | Freq: Once | INTRAMUSCULAR | Status: AC
  Administered 2020-10-06: 10 mg via INTRAVENOUS
  Filled 2020-10-06: qty 2

## 2020-10-06 MED ORDER — ONDANSETRON HCL 4 MG/2ML IJ SOLN
4.0000 mg | Freq: Once | INTRAMUSCULAR | Status: AC
  Administered 2020-10-06: 4 mg via INTRAVENOUS
  Filled 2020-10-06: qty 2

## 2020-10-06 NOTE — ED Notes (Signed)
Passed PO challenge

## 2020-10-06 NOTE — ED Triage Notes (Signed)
Pt is c/o vomiting x 3 days  States he has pain in his abdomen and back  States unable to tolerate food or liquids  States his urine is dark in color

## 2020-10-06 NOTE — ED Notes (Signed)
PO challenge given  

## 2020-10-06 NOTE — Discharge Instructions (Addendum)
Avoid smoking marijuana as this will worsen your recurrent nausea and vomiting.

## 2020-10-06 NOTE — ED Provider Notes (Signed)
[facility] EMERGENCY DEPARTMENT Provider Note   CSN: [id number] Arrival date & time: 10/06/20  7564     History Chief Complaint  Patient presents with  . Emesis    Carlos Guzman is a 35 y.o. male.  Patient presents to the emergency department for evaluation of upper abdominal pain radiating into his back with nausea and vomiting.  Symptoms ongoing for 3 days.  Patient has not been able to hold anything down.  He has not had any associated diarrhea, reports that he has not had a bowel movement over this period of time.  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  He reports that this does feel similar but has not had anything to drink in quite some time.        Past Medical History:  Diagnosis Date  . Asthma   . HIV (human immunodeficiency virus infection) (HCC)   . Pancreatitis     Patient Active Problem List   Diagnosis Date Noted  . ARF (acute renal failure) (HCC) 07/29/2019  . Hyperkalemia 07/29/2019  . Left upper quadrant abdominal pain 07/29/2019  . Metabolic acidosis, increased anion gap 07/29/2019  . Hyperbilirubinemia 07/29/2019  . Hyponatremia 07/29/2019  . Rhabdomyolysis 07/24/2019  . AKI (acute kidney injury) (HCC) 07/23/2019  . HIV (human immunodeficiency virus infection) (HCC) 07/23/2019  . Cannabinoid hyperemesis syndrome 07/23/2019  . Hypokalemia 07/23/2019  . Leukocytosis 07/23/2019  . Tobacco abuse 07/23/2019  . 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 NO PAST SURGERIES         Family History  Problem Relation Age of Onset  . CAD Neg Hx   . Diabetes Neg Hx     Social History   Tobacco Use  . Smoking status: Current Every Day Smoker    Packs/day: 1.00    Types: Cigarettes  . Smokeless tobacco: Never Used  Vaping Use  . Vaping Use: Never used  Substance Use Topics  . Alcohol use: Yes    Comment: quit a couple months ago   . Drug use: Yes    Types:  Marijuana    Home Medications Prior to Admission medications   Medication Sig Start Date End Date Taking? Authorizing Provider  acetaminophen (TYLENOL 8 HOUR) 650 MG CR tablet Take 1 tablet (650 mg total) by mouth every 8 (eight) hours as needed for pain. 03/24/20   Mannie Stabile, PA-C  bictegravir-emtricitabine-tenofovir AF (BIKTARVY) 50-200-25 MG TABS tablet Take 1 tablet by mouth daily.  04/03/19   [provider]  capsaicin (ZOSTRIX) 0.025 % cream Apply topically 2 (two) times daily. Patient not taking: Reported on 01/14/2020 07/30/19   Noralee Stain, DO  dicyclomine (BENTYL) 20 MG tablet Take 1 tablet (20 mg total) by mouth 2 (two) times daily. 01/15/20   Gailen Shelter, PA  metoCLOPramide (REGLAN) 10 MG tablet Take 1 tablet (10 mg total) by mouth every 6 (six) hours as needed for nausea or vomiting. Patient not taking: Reported on 01/14/2020 06/10/19   Molpus, John, MD  omeprazole (PRILOSEC) 20 MG capsule Take 1 capsule (20 mg total) by mouth daily. Patient taking differently: Take 20 mg by mouth daily as needed (reflux).  07/25/19   Robinson, [place] N, PA-C  ondansetron (ZOFRAN ODT) 4 MG disintegrating tablet Take 1 tablet (4 mg total) by mouth every 8 (eight) hours as needed for nausea or vomiting. 03/23/20   Pollyann Savoy, MD  famotidine (PEPCID) 20 MG tablet  Take 1 tablet (20 mg total) by mouth 2 (two) times daily. 10/11/17 06/10/19  Elson Areas, PA-C  sucralfate (CARAFATE) 1 g tablet Take 1 tablet (1 g total) by mouth 4 (four) times daily -  with meals and at bedtime. 06/10/19 06/10/19  Molpus, Jonny Ruiz, MD    Allergies    Patient has no known allergies.  Review of Systems   Review of Systems  Gastrointestinal: Positive for abdominal pain, nausea and vomiting.  All other systems reviewed and are negative.   Physical Exam Updated Vital Signs BP (!) 162/107 (BP Location: Right Arm)   Pulse (!) 114   Temp (!) 97.4 F (36.3 C) (Oral)   Ht 6\' 1"  (1.854 m)   Wt 77.1  kg   SpO2 99%   BMI 22.43 kg/m   Physical Exam Vitals and nursing note reviewed.  Constitutional:      General: He is in acute distress.     Appearance: Normal appearance. He is well-developed and well-nourished.  HENT:     Head: Normocephalic and atraumatic.     Right Ear: Hearing normal.     Left Ear: Hearing normal.     Nose: Nose normal.     Mouth/Throat:     Mouth: Oropharynx is clear and moist and mucous membranes are normal.  Eyes:     Extraocular Movements: EOM normal.     Conjunctiva/sclera: Conjunctivae normal.     Pupils: Pupils are equal, round, and reactive to light.  Cardiovascular:     Rate and Rhythm: Regular rhythm. Tachycardia present.     Heart sounds: S1 normal and S2 normal. No murmur heard. No friction rub. No gallop.   Pulmonary:     Effort: Pulmonary effort is normal. No respiratory distress.     Breath sounds: Normal breath sounds.  Chest:     Chest wall: No tenderness.  Abdominal:     General: Bowel sounds are normal.     Palpations: Abdomen is soft. There is no hepatosplenomegaly.     Tenderness: There is abdominal tenderness in the right upper quadrant, epigastric area and left upper quadrant. There is no guarding or rebound. Negative signs include Murphy's sign and McBurney's sign.     Hernia: No hernia is present.  Musculoskeletal:        General: Normal range of motion.     Cervical back: Normal range of motion and neck supple.  Skin:    General: Skin is warm, dry and intact.     Findings: No rash.     Nails: There is no cyanosis.  Neurological:     Mental Status: He is alert and oriented to person, place, and time.     GCS: GCS eye subscore is 4. GCS verbal subscore is 5. GCS motor subscore is 6.     Cranial Nerves: No cranial nerve deficit.     Sensory: No sensory deficit.     Coordination: Coordination normal.     Deep Tendon Reflexes: Strength normal.  Psychiatric:        Mood and Affect: Mood and affect normal.        Speech:  Speech normal.        Behavior: Behavior normal.        Thought Content: Thought content normal.     ED Results / Procedures / Treatments   Labs (all labs ordered are listed, but only abnormal results are displayed) Labs Reviewed  CBC WITH DIFFERENTIAL/PLATELET - Abnormal; Notable for the following components:  Result Value   WBC 20.4 (*)    RBC 6.04 (*)    Hemoglobin 18.7 (*)    HCT 52.6 (*)    Platelets 415 (*)    Neutro Abs 17.0 (*)    Abs Immature Granulocytes 0.26 (*)    All other components within normal limits  COMPREHENSIVE METABOLIC PANEL - Abnormal; Notable for the following components:   Sodium 131 (*)    Chloride 97 (*)    CO2 15 (*)    Glucose, Bld 153 (*)    BUN 33 (*)    Creatinine, Ser 2.48 (*)    Calcium 10.8 (*)    Total Protein 10.1 (*)    Albumin 5.6 (*)    GFR, Estimated 34 (*)    Anion gap 19 (*)    All other components within normal limits  URINALYSIS, ROUTINE W REFLEX MICROSCOPIC - Abnormal; Notable for the following components:   Hgb urine dipstick LARGE (*)    Bilirubin Urine MODERATE (*)    Ketones, ur 15 (*)    Protein, ur >300 (*)    All other components within normal limits  URINALYSIS, MICROSCOPIC (REFLEX) - Abnormal; Notable for the following components:   Bacteria, UA FEW (*)    All other components within normal limits  LIPASE, BLOOD    EKG None  Radiology No results found.  Procedures Procedures   Medications Ordered in ED Medications  sodium chloride 0.9 % bolus 1,000 mL (1,000 mLs Intravenous New Bag/Given 10/06/20 0526)  ondansetron (ZOFRAN) injection 4 mg (4 mg Intravenous Given 10/06/20 0529)  metoCLOPramide (REGLAN) injection 10 mg (10 mg Intravenous Given 10/06/20 0529)  diphenhydrAMINE (BENADRYL) injection 25 mg (25 mg Intravenous Given 10/06/20 0529)    ED Course  I have reviewed the triage vital signs and the nursing notes.  Pertinent labs & imaging results that were available during my care of the patient  were reviewed by me and considered in my medical decision making (see chart for details).    MDM Rules/Calculators/A&P                          Patient presents to the emergency department for evaluation of upper abdominal pain with nausea and vomiting.  Symptoms ongoing for 3 days.  Patient appears uncomfortable at arrival but is nontoxic.  Abdominal exam without focality, no guarding, rebound or peritonitis.  Lab work is at his baseline.  He has a chronic leukocytosis which is unchanged.  Patient with chronic renal failure, BUN and creatinine are actually better than they have been in the past.  Lipase is normal.  LFTs are normal.  Reviewing records reveals multiple visits with similar presentation.  Patient has been diagnosed with either cyclic vomiting syndrome or cannabinoid hyperemesis syndrome in the past.  Treated with IV fluids and antiemetics. Final Clinical Impression(s) / ED Diagnoses Final diagnoses:  Cyclical vomiting syndrome not associated with migraine    Rx / DC Orders ED Discharge Orders    None       Acea Yagi, Canary Brim, MD 10/06/20 [phone]

## 2020-10-08 ENCOUNTER — Emergency Department (HOSPITAL_BASED_OUTPATIENT_CLINIC_OR_DEPARTMENT_OTHER): Payer: PRIVATE HEALTH INSURANCE

## 2020-10-08 ENCOUNTER — Emergency Department (HOSPITAL_BASED_OUTPATIENT_CLINIC_OR_DEPARTMENT_OTHER)
Admission: EM | Admit: 2020-10-08 | Discharge: 2020-10-08 | Disposition: A | Discharge status: Home / Self Care | Payer: PRIVATE HEALTH INSURANCE | Attending: Emergency Medicine | Admitting: Emergency Medicine

## 2020-10-08 ENCOUNTER — Encounter (HOSPITAL_BASED_OUTPATIENT_CLINIC_OR_DEPARTMENT_OTHER): Payer: Self-pay | Admitting: Emergency Medicine

## 2020-10-08 ENCOUNTER — Other Ambulatory Visit: Payer: Self-pay

## 2020-10-08 DIAGNOSIS — R1010 Upper abdominal pain, unspecified: Secondary | ICD-10-CM | POA: Diagnosis not present

## 2020-10-08 DIAGNOSIS — S43004A Unspecified dislocation of right shoulder joint, initial encounter: Secondary | ICD-10-CM | POA: Diagnosis not present

## 2020-10-08 DIAGNOSIS — R112 Nausea with vomiting, unspecified: Secondary | ICD-10-CM | POA: Insufficient documentation

## 2020-10-08 DIAGNOSIS — E876 Hypokalemia: Secondary | ICD-10-CM | POA: Diagnosis not present

## 2020-10-08 DIAGNOSIS — S4991XA Unspecified injury of right shoulder and upper arm, initial encounter: Secondary | ICD-10-CM | POA: Diagnosis present

## 2020-10-08 DIAGNOSIS — J45909 Unspecified asthma, uncomplicated: Secondary | ICD-10-CM | POA: Insufficient documentation

## 2020-10-08 DIAGNOSIS — X501XXA Overexertion from prolonged static or awkward postures, initial encounter: Secondary | ICD-10-CM | POA: Diagnosis not present

## 2020-10-08 DIAGNOSIS — R000 Tachycardia, unspecified: Secondary | ICD-10-CM | POA: Insufficient documentation

## 2020-10-08 DIAGNOSIS — Z21 Asymptomatic human immunodeficiency virus [HIV] infection status: Secondary | ICD-10-CM | POA: Insufficient documentation

## 2020-10-08 DIAGNOSIS — F17210 Nicotine dependence, cigarettes, uncomplicated: Secondary | ICD-10-CM | POA: Diagnosis not present

## 2020-10-08 LAB — BASIC METABOLIC PANEL WITH GFR
Anion gap: 17 — ABNORMAL HIGH (ref 5–15)
BUN: 34 mg/dL — ABNORMAL HIGH (ref 6–20)
CO2: 24 mmol/L (ref 22–32)
Calcium: 10 mg/dL (ref 8.9–10.3)
Chloride: 87 mmol/L — ABNORMAL LOW (ref 98–111)
Creatinine, Ser: 2.24 mg/dL — ABNORMAL HIGH (ref 0.61–1.24)
GFR, Estimated: 38 mL/min — ABNORMAL LOW
Glucose, Bld: 127 mg/dL — ABNORMAL HIGH (ref 70–99)
Potassium: 2.8 mmol/L — ABNORMAL LOW (ref 3.5–5.1)
Sodium: 128 mmol/L — ABNORMAL LOW (ref 135–145)

## 2020-10-08 LAB — CBC
HCT: 51.1 % (ref 39.0–52.0)
Hemoglobin: 18.5 g/dL — ABNORMAL HIGH (ref 13.0–17.0)
MCH: 30.7 pg (ref 26.0–34.0)
MCHC: 36.2 g/dL — ABNORMAL HIGH (ref 30.0–36.0)
MCV: 84.7 fL (ref 80.0–100.0)
Platelets: 393 10*3/uL (ref 150–400)
RBC: 6.03 MIL/uL — ABNORMAL HIGH (ref 4.22–5.81)
RDW: 12.4 % (ref 11.5–15.5)
WBC: 17.5 10*3/uL — ABNORMAL HIGH (ref 4.0–10.5)
nRBC: 0 % (ref 0.0–0.2)

## 2020-10-08 MED ORDER — PROMETHAZINE HCL 25 MG PO TABS: 25.0000 mg | ORAL_TABLET | Freq: Three times a day (TID) | ORAL | 0 refills | Status: DC | PRN

## 2020-10-08 MED ORDER — LACTATED RINGERS IV BOLUS
1000.0000 mL | Freq: Once | INTRAVENOUS | Status: AC
  Administered 2020-10-08: 1000 mL via INTRAVENOUS

## 2020-10-08 MED ORDER — POTASSIUM CHLORIDE 10 MEQ/100ML IV SOLN
10.0000 meq | Freq: Once | INTRAVENOUS | Status: AC
  Administered 2020-10-08: 10 meq via INTRAVENOUS
  Filled 2020-10-08: qty 100

## 2020-10-08 MED ORDER — POTASSIUM CHLORIDE CRYS ER 20 MEQ PO TBCR: 20.0000 meq | EXTENDED_RELEASE_TABLET | Freq: Two times a day (BID) | ORAL | 0 refills | Status: DC

## 2020-10-08 MED ORDER — PROMETHAZINE HCL 25 MG/ML IJ SOLN
25.0000 mg | Freq: Once | INTRAMUSCULAR | Status: AC
  Administered 2020-10-08: 25 mg via INTRAVENOUS
  Filled 2020-10-08: qty 1

## 2020-10-08 NOTE — ED Notes (Signed)
Given po fluids 

## 2020-10-08 NOTE — ED Provider Notes (Signed)
[facility] EMERGENCY DEPARTMENT Provider Note   CSN: [id number] Arrival date & time: 10/08/20  0753     History Chief Complaint  Patient presents with  . Emesis  . Shoulder Pain    Carlos Guzman is a 35 y.o. male.  HPI Patient presents with nausea and vomiting.  Upper abdominal pain.  History of cannabinoid hyperemesis versus cyclic vomiting syndrome.  Seen in the ER 2 days ago for same.  States he was feeling okay when he left but then ate some food he was not supposed to and states that it came back.  States he has Zofran at home but it has not been working.  Pain is dull.  Upper abdomen.  Similar discharge previous episodes. States that he also felt his right shoulder pop out when he was pushing himself up.  States that he has had this happen before but never been seen for it.  States that he felt it pop back and also.  No other injury.  No numbness or weakness.  Still has some pain in the shoulder.    Past Medical History:  Diagnosis Date  . Asthma   . HIV (human immunodeficiency virus infection) (HCC)   . Pancreatitis     Patient Active Problem List   Diagnosis Date Noted  . ARF (acute renal failure) (HCC) 07/29/2019  . Hyperkalemia 07/29/2019  . Left upper quadrant abdominal pain 07/29/2019  . Metabolic acidosis, increased anion gap 07/29/2019  . Hyperbilirubinemia 07/29/2019  . Hyponatremia 07/29/2019  . Rhabdomyolysis 07/24/2019  . AKI (acute kidney injury) (HCC) 07/23/2019  . HIV (human immunodeficiency virus infection) (HCC) 07/23/2019  . Cannabinoid hyperemesis syndrome 07/23/2019  . Hypokalemia 07/23/2019  . Leukocytosis 07/23/2019  . Tobacco abuse 07/23/2019  . 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 NO PAST SURGERIES         Family History  Problem Relation Age of Onset  . CAD Neg Hx   . Diabetes Neg Hx     Social History   Tobacco Use  . Smoking status: Current Every Day  Smoker    Packs/day: 1.00    Types: Cigarettes  . Smokeless tobacco: Never Used  Vaping Use  . Vaping Use: Never used  Substance Use Topics  . Alcohol use: Yes    Comment: quit a couple months ago   . Drug use: Yes    Types: Marijuana    Comment: last used on 10/04/20    Home Medications Prior to Admission medications   Medication Sig Start Date End Date Taking? Authorizing Provider  bictegravir-emtricitabine-tenofovir AF (BIKTARVY) 50-200-25 MG TABS tablet Take 1 tablet by mouth daily.  04/03/19  Yes [provider]  ondansetron (ZOFRAN ODT) 4 MG disintegrating tablet Take 1 tablet (4 mg total) by mouth every 8 (eight) hours as needed for nausea or vomiting. 03/23/20  Yes Pollyann Savoy, MD  potassium chloride SA (KLOR-CON) 20 MEQ tablet Take 1 tablet (20 mEq total) by mouth 2 (two) times daily. 10/08/20  Yes Benjiman Core, MD  promethazine (PHENERGAN) 25 MG tablet Take 1 tablet (25 mg total) by mouth every 8 (eight) hours as needed for nausea. 10/08/20  Yes Benjiman Core, MD  acetaminophen (TYLENOL 8 HOUR) 650 MG CR tablet Take 1 tablet (650 mg total) by mouth every 8 (eight) hours as needed for pain. 03/24/20   Mannie Stabile, PA-C  capsaicin (ZOSTRIX) 0.025 % cream Apply topically 2 (two) times  daily. Patient not taking: Reported on 01/14/2020 07/30/19   Noralee Stain, DO  dicyclomine (BENTYL) 20 MG tablet Take 1 tablet (20 mg total) by mouth 2 (two) times daily. 01/15/20   Gailen Shelter, PA  metoCLOPramide (REGLAN) 10 MG tablet Take 1 tablet (10 mg total) by mouth every 6 (six) hours as needed for nausea or vomiting. Patient not taking: No sig reported 06/10/19   Molpus, John, MD  omeprazole (PRILOSEC) 20 MG capsule Take 1 capsule (20 mg total) by mouth daily. Patient taking differently: Take 20 mg by mouth daily as needed (reflux).  07/25/19   Robinson, [place] N, PA-C  famotidine (PEPCID) 20 MG tablet Take 1 tablet (20 mg total) by mouth 2 (two) times daily.  10/11/17 06/10/19  Elson Areas, PA-C  sucralfate (CARAFATE) 1 g tablet Take 1 tablet (1 g total) by mouth 4 (four) times daily -  with meals and at bedtime. 06/10/19 06/10/19  Molpus, Jonny Ruiz, MD    Allergies    Patient has no known allergies.  Review of Systems   Review of Systems  Constitutional: Positive for appetite change. Negative for chills and fever.  HENT: Negative for congestion.   Respiratory: Negative for cough.   Gastrointestinal: Positive for abdominal pain, nausea and vomiting.  Genitourinary: Negative for flank pain.  Musculoskeletal:       Right shoulder pain.  Skin: Negative for wound.  Neurological: Negative for weakness.  Psychiatric/Behavioral: Negative for confusion.    Physical Exam Updated Vital Signs BP (!) 164/82   Pulse 90   Temp (!) 97.4 F (36.3 C) (Oral)   Resp 17   SpO2 100%   Physical Exam Vitals and nursing note reviewed.  HENT:     Head: Normocephalic.     Mouth/Throat:     Mouth: Mucous membranes are moist.  Eyes:     Extraocular Movements: Extraocular movements intact.  Neck:     Comments: Mild tachycardia. Pulmonary:     Breath sounds: Normal breath sounds.  Abdominal:     Tenderness: There is abdominal tenderness.     Comments: Upper abdominal tenderness without rebound or guarding.  No hernia palpated.  Musculoskeletal:     Comments: Tenderness to right anterior shoulder.  No deformity. pain with range of motion.  No tenderness over elbow or wrist.  Sensation intact over deltoid.  Skin:    General: Skin is warm.     Capillary Refill: Capillary refill takes less than 2 seconds.  Neurological:     Mental Status: He is alert and oriented to person, place, and time.     ED Results / Procedures / Treatments   Labs (all labs ordered are listed, but only abnormal results are displayed) Labs Reviewed  CBC - Abnormal; Notable for the following components:      Result Value   WBC 17.5 (*)    RBC 6.03 (*)    Hemoglobin 18.5 (*)     MCHC 36.2 (*)    All other components within normal limits  BASIC METABOLIC PANEL - Abnormal; Notable for the following components:   Sodium 128 (*)    Potassium 2.8 (*)    Chloride 87 (*)    Glucose, Bld 127 (*)    BUN 34 (*)    Creatinine, Ser 2.24 (*)    GFR, Estimated 38 (*)    Anion gap 17 (*)    All other components within normal limits    EKG None  Radiology DG Shoulder Right  Result Date: 10/08/2020 CLINICAL DATA:  Pain.  Probable prior dislocation. EXAM: RIGHT SHOULDER - 2+ VIEW COMPARISON:  None. FINDINGS: Located glenohumeral joint. Amorphous calcification below the glenoid likely related to avulsion fracture given history of dislocation, favor nonacute and related to injury 9 months ago. No acute fracture. IMPRESSION: 1. No acute fracture or dislocation. 2. Evidence of prior avulsion fracture at the inferior glenoid, correlating with suspected prior dislocation. Electronically Signed   By: Marnee Spring M.D.   On: 10/08/2020 09:04    Procedures Procedures   Medications Ordered in ED Medications  lactated ringers bolus 1,000 mL (0 mLs Intravenous Stopped 10/08/20 0955)  promethazine (PHENERGAN) injection 25 mg (25 mg Intravenous Given 10/08/20 0846)  potassium chloride 10 mEq in 100 mL IVPB (0 mEq Intravenous Stopped 10/08/20 1149)  lactated ringers bolus 1,000 mL (0 mLs Intravenous Stopped 10/08/20 1148)    ED Course  I have reviewed the triage vital signs and the nursing notes.  Pertinent labs & imaging results that were available during my care of the patient were reviewed by me and considered in my medical decision making (see chart for details).    MDM Rules/Calculators/A&P                          Patient with acute on chronic nausea vomiting abdominal pain.  History of same.  Recently seen for same.  Reviewed blood work since does have chronic renal insufficiency.  Renal status actually improved from most recent.  However does have a hypokalemia.  Fluid  boluses given as well as some potassium supplementation.  Feels better after Phenergan and fluids.  Tolerated orals.  Will discharge with outpatient follow-up as needed. Also had right shoulder pain.  States it popped out last night.  History of same.  Has self reduced prior to arrival.  I think likely subluxation versus previous dislocation.  X-ray showed likely small avulsion fracture from previous episode.  Has not followed up previously.  Will give sling and have follow-up with sports medicine.  Discharge home. Final Clinical Impression(s) / ED Diagnoses Final diagnoses:  Non-intractable vomiting with nausea, unspecified vomiting type  Hypokalemia  Shoulder dislocation, right, initial encounter    Rx / DC Orders ED Discharge Orders         Ordered    promethazine (PHENERGAN) 25 MG tablet  Every 8 hours PRN        10/08/20 1155    potassium chloride SA (KLOR-CON) 20 MEQ tablet  2 times daily        10/08/20 1155           Benjiman Core, MD 10/08/20 1159

## 2020-10-08 NOTE — ED Triage Notes (Signed)
States, " I still keep vomiting and my right shoulder is hurting" Has had prior dislocation to right shoulder .

## 2020-10-08 NOTE — ED Notes (Signed)
Patient transported to X-ray 

## 2020-10-08 NOTE — ED Notes (Signed)
Straight stick for light green blood successful.

## 2020-10-12 ENCOUNTER — Telehealth: Payer: Self-pay | Admitting: Family Medicine

## 2020-10-12 NOTE — Telephone Encounter (Signed)
Called pt to offer ED follow up appt w/Dr.Schmitz- no answer --left msg for pt to call office--glh

## 2020-11-17 ENCOUNTER — Encounter (HOSPITAL_BASED_OUTPATIENT_CLINIC_OR_DEPARTMENT_OTHER): Payer: Self-pay | Admitting: Emergency Medicine

## 2020-11-17 ENCOUNTER — Emergency Department (HOSPITAL_BASED_OUTPATIENT_CLINIC_OR_DEPARTMENT_OTHER)
Admission: EM | Admit: 2020-11-17 | Discharge: 2020-11-17 | Disposition: A | Discharge status: Home / Self Care | Payer: PRIVATE HEALTH INSURANCE | Attending: Emergency Medicine | Admitting: Emergency Medicine

## 2020-11-17 ENCOUNTER — Other Ambulatory Visit: Payer: Self-pay

## 2020-11-17 DIAGNOSIS — J45909 Unspecified asthma, uncomplicated: Secondary | ICD-10-CM | POA: Insufficient documentation

## 2020-11-17 DIAGNOSIS — R1115 Cyclical vomiting syndrome unrelated to migraine: Secondary | ICD-10-CM

## 2020-11-17 DIAGNOSIS — F1290 Cannabis use, unspecified, uncomplicated: Secondary | ICD-10-CM | POA: Insufficient documentation

## 2020-11-17 DIAGNOSIS — F17210 Nicotine dependence, cigarettes, uncomplicated: Secondary | ICD-10-CM | POA: Insufficient documentation

## 2020-11-17 DIAGNOSIS — Z21 Asymptomatic human immunodeficiency virus [HIV] infection status: Secondary | ICD-10-CM | POA: Insufficient documentation

## 2020-11-17 DIAGNOSIS — R1084 Generalized abdominal pain: Secondary | ICD-10-CM | POA: Insufficient documentation

## 2020-11-17 DIAGNOSIS — G43A0 Cyclical vomiting, not intractable: Secondary | ICD-10-CM | POA: Insufficient documentation

## 2020-11-17 LAB — URINALYSIS, MICROSCOPIC (REFLEX)

## 2020-11-17 LAB — CBC
HCT: 51.3 % (ref 39.0–52.0)
Hemoglobin: 18.1 g/dL — ABNORMAL HIGH (ref 13.0–17.0)
MCH: 30.8 pg (ref 26.0–34.0)
MCHC: 35.3 g/dL (ref 30.0–36.0)
MCV: 87.4 fL (ref 80.0–100.0)
Platelets: 402 10*3/uL — ABNORMAL HIGH (ref 150–400)
RBC: 5.87 MIL/uL — ABNORMAL HIGH (ref 4.22–5.81)
RDW: 12.8 % (ref 11.5–15.5)
WBC: 19.1 10*3/uL — ABNORMAL HIGH (ref 4.0–10.5)
nRBC: 0 % (ref 0.0–0.2)

## 2020-11-17 LAB — COMPREHENSIVE METABOLIC PANEL
ALT: 18 U/L (ref 0–44)
AST: 22 U/L (ref 15–41)
Albumin: 5.3 g/dL — ABNORMAL HIGH (ref 3.5–5.0)
Alkaline Phosphatase: 62 U/L (ref 38–126)
Anion gap: 17 — ABNORMAL HIGH (ref 5–15)
BUN: 35 mg/dL — ABNORMAL HIGH (ref 6–20)
CO2: 22 mmol/L (ref 22–32)
Calcium: 10.7 mg/dL — ABNORMAL HIGH (ref 8.9–10.3)
Chloride: 93 mmol/L — ABNORMAL LOW (ref 98–111)
Creatinine, Ser: 1.82 mg/dL — ABNORMAL HIGH (ref 0.61–1.24)
GFR, Estimated: 49 mL/min — ABNORMAL LOW (ref >60)
Glucose, Bld: 134 mg/dL — ABNORMAL HIGH (ref 70–99)
Potassium: 3.5 mmol/L (ref 3.5–5.1)
Sodium: 132 mmol/L — ABNORMAL LOW (ref 135–145)
Total Bilirubin: 1.2 mg/dL (ref 0.3–1.2)
Total Protein: 10 g/dL — ABNORMAL HIGH (ref 6.5–8.1)

## 2020-11-17 LAB — URINALYSIS, ROUTINE W REFLEX MICROSCOPIC
Glucose, UA: NEGATIVE mg/dL
Ketones, ur: 15 mg/dL — AB
Leukocytes,Ua: NEGATIVE
Nitrite: NEGATIVE
Protein, ur: >300 mg/dL — AB
Specific Gravity, Urine: >1.03 — ABNORMAL HIGH (ref 1.005–1.030)
pH: 6 (ref 5.0–8.0)

## 2020-11-17 LAB — LIPASE, BLOOD: Lipase: 34 U/L (ref 11–51)

## 2020-11-17 MED ORDER — PROMETHAZINE HCL 25 MG PO TABS: 25.0000 mg | ORAL_TABLET | Freq: Four times a day (QID) | ORAL | 0 refills | Status: DC | PRN

## 2020-11-17 MED ORDER — OMEPRAZOLE 20 MG PO CPDR: 20.0000 mg | DELAYED_RELEASE_CAPSULE | Freq: Every day | ORAL | 0 refills | Status: DC

## 2020-11-17 MED ORDER — CAPSAICIN 0.025 % EX CREA
TOPICAL_CREAM | CUTANEOUS | Status: AC
  Filled 2020-11-17: qty 60

## 2020-11-17 MED ORDER — HALOPERIDOL LACTATE 5 MG/ML IJ SOLN
5.0000 mg | Freq: Once | INTRAMUSCULAR | Status: AC
  Administered 2020-11-17: 5 mg via INTRAVENOUS
  Filled 2020-11-17: qty 1

## 2020-11-17 MED ORDER — LACTATED RINGERS IV BOLUS
2000.0000 mL | Freq: Once | INTRAVENOUS | Status: AC
  Administered 2020-11-17: 2000 mL via INTRAVENOUS

## 2020-11-17 NOTE — ED Triage Notes (Signed)
N/V and abdominal pain x 3 days.  No diarrhea or fever.  History of same.  Pt states he ate a hamburger.

## 2020-11-17 NOTE — ED Notes (Signed)
In to round on client, resting quietly with eyes closed, opens eyes to light verbal stimuli, states he is feeling better. IVF bolus was off upon arrival in to room, IVF restarted. VS obtained.

## 2020-11-17 NOTE — ED Notes (Signed)
AVS provided to client, pt teaching done re: to the two Rx's sent to pt pharmacy. Also discussed bland diet for the next few days and to increase PO fluids. Opportunity for questions provided as well.

## 2020-11-17 NOTE — ED Notes (Signed)
Pt awake, resting quietly, IVF boluses completed, pt tolerates PO fluids well, denies any pain or n/v at this time.

## 2020-11-17 NOTE — Discharge Instructions (Addendum)
Please stop using marijuana.  Please use Phenergan which I have prescribed you today for nausea.  If you are unable to keep anything down you may use this as a suppository.  Please drink plenty of water.  Please follow-up with your primary care doctor did not have a primary care doctor please follow-up at the wellness clinic.

## 2020-11-17 NOTE — ED Provider Notes (Signed)
[facility] EMERGENCY DEPARTMENT Provider Note   CSN: [id number] Arrival date & time: 11/17/20  1004     History Chief Complaint  Patient presents with  . Abdominal Pain    Carlos Guzman is a 34 y.o. male.  HPI Patient is a 35 year old male with a past medical history significant for HIV on Biktarvy which he states he has taken, asthma and pancreatitis.  He is presented to the ER today with nausea, vomiting, diffuse abdominal pain history of [place] hyperemesis versus cyclic vomiting syndrome with similar presentations and symptoms most recently 09/11/2020.  The date appears that he received promethazine, IV fluids and was able to be discharged home.  He states his symptoms today started 3 days ago.  He states that he smokes weed daily.  He states that he attempted use Zofran at home but with no improvement.  He states that his symptoms are similar to his last presentation.  He denies any chest pain or shortness of breath.  No lightheadedness or dizziness.  He states he feels severely nauseous and is dry heaving at this time.  He denies any fevers or chills no change in bowel movements or with urination.  No associated symptoms.  No aggravating mitigating factors.    Past Medical History:  Diagnosis Date  . Asthma   . HIV (human immunodeficiency virus infection) (HCC)   . Pancreatitis     Patient Active Problem List   Diagnosis Date Noted  . ARF (acute renal failure) (HCC) 07/29/2019  . Hyperkalemia 07/29/2019  . Left upper quadrant abdominal pain 07/29/2019  . Metabolic acidosis, increased anion gap 07/29/2019  . Hyperbilirubinemia 07/29/2019  . Hyponatremia 07/29/2019  . Rhabdomyolysis 07/24/2019  . AKI (acute kidney injury) (HCC) 07/23/2019  . HIV (human immunodeficiency virus infection) (HCC) 07/23/2019  . Cannabinoid hyperemesis syndrome 07/23/2019  . Hypokalemia 07/23/2019  . Leukocytosis 07/23/2019  . Tobacco abuse 07/23/2019  . 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 NO PAST SURGERIES         Family History  Problem Relation Age of Onset  . CAD Neg Hx   . Diabetes Neg Hx     Social History   Tobacco Use  . Smoking status: Current Every Day Smoker    Packs/day: 1.00    Types: Cigarettes  . Smokeless tobacco: Never Used  Vaping Use  . Vaping Use: Never used  Substance Use Topics  . Alcohol use: Yes    Comment: quit a couple months ago   . Drug use: Yes    Types: Marijuana    Comment: last used on 10/04/20    Home Medications Prior to Admission medications   Medication Sig Start Date End Date Taking? Authorizing Provider  promethazine (PHENERGAN) 25 MG tablet Take 1 tablet (25 mg total) by mouth every 6 (six) hours as needed for nausea or vomiting. 11/17/20  Yes Fondaw, Wylder S, PA  bictegravir-emtricitabine-tenofovir AF (BIKTARVY) 50-200-25 MG TABS tablet Take 1 tablet by mouth daily.  04/03/19   [provider]  omeprazole (PRILOSEC) 20 MG capsule Take 1 capsule (20 mg total) by mouth daily. 11/17/20   Gailen Shelter, PA  ondansetron (ZOFRAN ODT) 4 MG disintegrating tablet Take 1 tablet (4 mg total) by mouth every 8 (eight) hours as needed for nausea or vomiting. 03/23/20   Pollyann Savoy, MD  potassium chloride SA (KLOR-CON) 20 MEQ tablet Take 1 tablet (20 mEq total) by mouth  2 (two) times daily. 10/08/20   Benjiman CorePickering, Nathan, MD  dicyclomine (BENTYL) 20 MG tablet Take 1 tablet (20 mg total) by mouth 2 (two) times daily. 01/15/20 11/17/20  Gailen ShelterFondaw, Wylder S, PA  famotidine (PEPCID) 20 MG tablet Take 1 tablet (20 mg total) by mouth 2 (two) times daily. 10/11/17 06/10/19  Elson AreasSofia, Leslie K, PA-C  metoCLOPramide (REGLAN) 10 MG tablet Take 1 tablet (10 mg total) by mouth every 6 (six) hours as needed for nausea or vomiting. Patient not taking: No sig reported 06/10/19 11/17/20  Molpus, Jonny RuizJohn, MD  sucralfate (CARAFATE) 1 g tablet Take 1 tablet (1 g total) by mouth 4  (four) times daily -  with meals and at bedtime. 06/10/19 06/10/19  Molpus, Jonny RuizJohn, MD    Allergies    Patient has no known allergies.  Review of Systems   Review of Systems  Constitutional: Positive for fatigue. Negative for chills and fever.  HENT: Negative for congestion.   Eyes: Negative for pain.  Respiratory: Negative for cough and shortness of breath.   Cardiovascular: Negative for chest pain and leg swelling.  Gastrointestinal: Positive for abdominal pain, nausea and vomiting. Negative for diarrhea.  Genitourinary: Negative for dysuria.  Musculoskeletal: Negative for myalgias.  Skin: Negative for rash.  Neurological: Negative for dizziness and headaches.    Physical Exam Updated Vital Signs BP (!) 144/81 (BP Location: Left Arm)   Pulse 84   Temp 97.7 F (36.5 C) (Oral)   Resp 16   Ht 6\' 1"  (1.854 m)   Wt 79.4 kg   SpO2 100%   BMI 23.09 kg/m   Physical Exam Vitals and nursing note reviewed.  Constitutional:      General: He is in acute distress.     Comments: Patient is a 35 year old male, writhing in bed and discomfort.  HENT:     Head: Normocephalic and atraumatic.     Nose: Nose normal.  Eyes:     General: No scleral icterus. Cardiovascular:     Rate and Rhythm: Normal rate and regular rhythm.     Pulses: Normal pulses.     Heart sounds: Normal heart sounds.  Pulmonary:     Effort: No respiratory distress.     Breath sounds: Normal breath sounds. No wheezing.     Comments: Patient is tachypneic likely from pain. Lungs are clear to auscultation all fields Abdominal:     Palpations: Abdomen is soft.     Tenderness: There is abdominal tenderness.     Comments: Abdomen is soft, diffuse tenderness to palpation.  No involuntary guarding or rebound.  No palpable masses.  Genitourinary:    Comments: Deferred Musculoskeletal:     Cervical back: Normal range of motion.     Right lower leg: No edema.     Left lower leg: No edema.  Skin:    General: Skin is  warm and dry.     Capillary Refill: Capillary refill takes less than 2 seconds.  Neurological:     Mental Status: He is alert. Mental status is at baseline.  Psychiatric:        Mood and Affect: Mood normal.        Behavior: Behavior normal.     ED Results / Procedures / Treatments   Labs (all labs ordered are listed, but only abnormal results are displayed) Labs Reviewed  COMPREHENSIVE METABOLIC PANEL - Abnormal; Notable for the following components:      Result Value   Sodium 132 (*)  Chloride 93 (*)    Glucose, Bld 134 (*)    BUN 35 (*)    Creatinine, Ser 1.82 (*)    Calcium 10.7 (*)    Total Protein 10.0 (*)    Albumin 5.3 (*)    GFR, Estimated 49 (*)    Anion gap 17 (*)    All other components within normal limits  CBC - Abnormal; Notable for the following components:   WBC 19.1 (*)    RBC 5.87 (*)    Hemoglobin 18.1 (*)    Platelets 402 (*)    All other components within normal limits  URINALYSIS, ROUTINE W REFLEX MICROSCOPIC - Abnormal; Notable for the following components:   Color, Urine AMBER (*)    APPearance CLOUDY (*)    Specific Gravity, Urine >1.030 (*)    Hgb urine dipstick MODERATE (*)    Bilirubin Urine MODERATE (*)    Ketones, ur 15 (*)    Protein, ur >300 (*)    All other components within normal limits  URINALYSIS, MICROSCOPIC (REFLEX) - Abnormal; Notable for the following components:   Bacteria, UA MANY (*)    All other components within normal limits  LIPASE, BLOOD    EKG None  Radiology No results found.  Procedures Procedures   Medications Ordered in ED Medications  lactated ringers bolus 2,000 mL (0 mLs Intravenous Stopped 11/17/20 1438)  haloperidol lactate (HALDOL) injection 5 mg (5 mg Intravenous Given 11/17/20 1246)  capsaicin (ZOSTRIX) 0.025 % cream ( Topical Given 11/17/20 1237)    ED Course  I have reviewed the triage vital signs and the nursing notes.  Pertinent labs & imaging results that were available during my care  of the patient were reviewed by me and considered in my medical decision making (see chart for details).    MDM Rules/Calculators/A&P                          Patient with acute on chronic nausea vomiting abdominal pain.  He has history of similar presentations.  His physical exam initially is notable for severe abdominal tenderness however this completely resolved after Haldol and IV hydration.  Patient was very sleepy after Haldol administration however has been p.o. challenged now that he is more awake.  He feels completely better.  Patient has chronic leukocytosis this seems to occur frequently with his symptoms presented today.  Hemoglobin elevated likely due to severe dehydration.  Mild hyponatremia hypochloremia, potassium within normal limits.  Creatinine is actually improved from prior.  Does appear significantly dehydrated.  Received 2 L of LR with the Haldol. Tachycardia has resolved on my reassessment he is no longer has abdominal tenderness.  He states that he feels significantly improved and will be discharged home at this time.  Urinalysis with protein and ketones and evidence of severe dehydration.  No evidence of acute infection lipase within the limits of pancreatitis.  EKG nonischemic.  Offered patient further observation which he declined.  Discharged home at this time.  He will follow-up with his infectious disease doctor for HIV as well as with his PCP.  I gave him the information for the [place] and wellness clinic and strongly encouraged him to follow-up with a medical practitioner when he is not experiencing severe abdominal pain nausea and vomiting.  I also counseled him on the need to stop smoking marijuana daily.  I did to discharge patient with the capsaicin cream that we administered to his  abdomen during his ER visit however he left in the room.  Final Clinical Impression(s) / ED Diagnoses Final diagnoses:  Cyclical vomiting  Marijuana use    Rx / DC Orders ED  Discharge Orders         Ordered    promethazine (PHENERGAN) 25 MG tablet  Every 6 hours PRN        11/17/20 1451    omeprazole (PRILOSEC) 20 MG capsule  Daily,   Status:  Discontinued        11/17/20 1451    omeprazole (PRILOSEC) 20 MG capsule  Daily        11/17/20 1451           Gailen Shelter, [place] 11/17/20 1508    Rozelle Logan, DO 11/19/20 0720

## 2020-11-17 NOTE — ED Notes (Signed)
PO fluid challenge initiated 

## 2021-01-04 ENCOUNTER — Emergency Department (HOSPITAL_BASED_OUTPATIENT_CLINIC_OR_DEPARTMENT_OTHER)
Admission: EM | Admit: 2021-01-04 | Discharge: 2021-01-04 | Disposition: A | Discharge status: Home / Self Care | Payer: Self-pay | Attending: Emergency Medicine | Admitting: Emergency Medicine

## 2021-01-04 ENCOUNTER — Other Ambulatory Visit: Payer: Self-pay

## 2021-01-04 ENCOUNTER — Encounter (HOSPITAL_BASED_OUTPATIENT_CLINIC_OR_DEPARTMENT_OTHER): Payer: Self-pay | Admitting: Emergency Medicine

## 2021-01-04 DIAGNOSIS — N179 Acute kidney failure, unspecified: Secondary | ICD-10-CM | POA: Insufficient documentation

## 2021-01-04 DIAGNOSIS — R197 Diarrhea, unspecified: Secondary | ICD-10-CM | POA: Insufficient documentation

## 2021-01-04 DIAGNOSIS — R112 Nausea with vomiting, unspecified: Secondary | ICD-10-CM | POA: Insufficient documentation

## 2021-01-04 DIAGNOSIS — E876 Hypokalemia: Secondary | ICD-10-CM | POA: Insufficient documentation

## 2021-01-04 DIAGNOSIS — E871 Hypo-osmolality and hyponatremia: Secondary | ICD-10-CM | POA: Insufficient documentation

## 2021-01-04 DIAGNOSIS — E860 Dehydration: Secondary | ICD-10-CM

## 2021-01-04 DIAGNOSIS — J45909 Unspecified asthma, uncomplicated: Secondary | ICD-10-CM | POA: Insufficient documentation

## 2021-01-04 DIAGNOSIS — Z21 Asymptomatic human immunodeficiency virus [HIV] infection status: Secondary | ICD-10-CM | POA: Insufficient documentation

## 2021-01-04 DIAGNOSIS — F17210 Nicotine dependence, cigarettes, uncomplicated: Secondary | ICD-10-CM | POA: Insufficient documentation

## 2021-01-04 HISTORY — DX: Cannabis use, unspecified, uncomplicated: F12.90

## 2021-01-04 HISTORY — DX: Nausea with vomiting, unspecified: R11.2

## 2021-01-04 HISTORY — DX: Other psychoactive substance abuse, uncomplicated: F19.10

## 2021-01-04 LAB — CBC WITH DIFFERENTIAL/PLATELET
Abs Immature Granulocytes: 0.11 10*3/uL — ABNORMAL HIGH (ref 0.00–0.07)
Basophils Absolute: 0.1 10*3/uL (ref 0.0–0.1)
Basophils Relative: 0 %
Eosinophils Absolute: 0 10*3/uL (ref 0.0–0.5)
Eosinophils Relative: 0 %
HCT: 50 % (ref 39.0–52.0)
Hemoglobin: 17.9 g/dL — ABNORMAL HIGH (ref 13.0–17.0)
Immature Granulocytes: 1 %
Lymphocytes Relative: 16 %
Lymphs Abs: 3.2 10*3/uL (ref 0.7–4.0)
MCH: 30.8 pg (ref 26.0–34.0)
MCHC: 35.8 g/dL (ref 30.0–36.0)
MCV: 86.1 fL (ref 80.0–100.0)
Monocytes Absolute: 1.7 10*3/uL — ABNORMAL HIGH (ref 0.1–1.0)
Monocytes Relative: 8 %
Neutro Abs: 14.7 10*3/uL — ABNORMAL HIGH (ref 1.7–7.7)
Neutrophils Relative %: 75 %
Platelets: 395 10*3/uL (ref 150–400)
RBC: 5.81 MIL/uL (ref 4.22–5.81)
RDW: 12.3 % (ref 11.5–15.5)
WBC: 19.8 10*3/uL — ABNORMAL HIGH (ref 4.0–10.5)
nRBC: 0 % (ref 0.0–0.2)

## 2021-01-04 LAB — COMPREHENSIVE METABOLIC PANEL
ALT: 20 U/L (ref 0–44)
AST: 24 U/L (ref 15–41)
Albumin: 5.1 g/dL — ABNORMAL HIGH (ref 3.5–5.0)
Alkaline Phosphatase: 54 U/L (ref 38–126)
Anion gap: 21 — ABNORMAL HIGH (ref 5–15)
BUN: 88 mg/dL — ABNORMAL HIGH (ref 6–20)
CO2: 25 mmol/L (ref 22–32)
Calcium: 9.9 mg/dL (ref 8.9–10.3)
Chloride: 81 mmol/L — ABNORMAL LOW (ref 98–111)
Creatinine, Ser: 4.61 mg/dL — ABNORMAL HIGH (ref 0.61–1.24)
GFR, Estimated: 16 mL/min — ABNORMAL LOW (ref >60)
Glucose, Bld: 142 mg/dL — ABNORMAL HIGH (ref 70–99)
Potassium: 3 mmol/L — ABNORMAL LOW (ref 3.5–5.1)
Sodium: 127 mmol/L — ABNORMAL LOW (ref 135–145)
Total Bilirubin: 1.4 mg/dL — ABNORMAL HIGH (ref 0.3–1.2)
Total Protein: 9.6 g/dL — ABNORMAL HIGH (ref 6.5–8.1)

## 2021-01-04 LAB — URINALYSIS, MICROSCOPIC (REFLEX)

## 2021-01-04 LAB — URINALYSIS, ROUTINE W REFLEX MICROSCOPIC
Glucose, UA: NEGATIVE mg/dL
Ketones, ur: NEGATIVE mg/dL
Leukocytes,Ua: NEGATIVE
Nitrite: NEGATIVE
Protein, ur: 30 mg/dL — AB
Specific Gravity, Urine: 1.025 (ref 1.005–1.030)
pH: 5.5 (ref 5.0–8.0)

## 2021-01-04 MED ORDER — POTASSIUM CHLORIDE CRYS ER 20 MEQ PO TBCR
40.0000 meq | EXTENDED_RELEASE_TABLET | Freq: Once | ORAL | Status: AC
  Administered 2021-01-04: 40 meq via ORAL
  Filled 2021-01-04: qty 2

## 2021-01-04 MED ORDER — LACTATED RINGERS IV BOLUS
1000.0000 mL | Freq: Once | INTRAVENOUS | Status: AC
  Administered 2021-01-04: 1000 mL via INTRAVENOUS

## 2021-01-04 MED ORDER — ONDANSETRON HCL 4 MG PO TABS: 4.0000 mg | ORAL_TABLET | Freq: Three times a day (TID) | ORAL | 0 refills | Status: DC | PRN

## 2021-01-04 MED ORDER — ONDANSETRON HCL 4 MG/2ML IJ SOLN
4.0000 mg | Freq: Once | INTRAMUSCULAR | Status: AC
  Administered 2021-01-04: 4 mg via INTRAVENOUS
  Filled 2021-01-04: qty 2

## 2021-01-04 MED ORDER — HALOPERIDOL LACTATE 5 MG/ML IJ SOLN: 5.0000 mg | Freq: Once | INTRAMUSCULAR | Status: DC | PRN

## 2021-01-04 NOTE — ED Notes (Signed)
Given po fluid challenge

## 2021-01-04 NOTE — ED Notes (Signed)
Pt verbalizes understanding of worsening kidney function and states he will return if not feeling any better

## 2021-01-04 NOTE — ED Triage Notes (Signed)
Pt c/o abd pain with vomiting and diarrhea x 2 days.

## 2021-01-04 NOTE — ED Notes (Signed)
ED Provider at bedside. 

## 2021-01-04 NOTE — ED Provider Notes (Signed)
[facility] Provider Note: Lowella Dell, MD, FACEP  CSN: [id number] MRN: [id number] ARRIVAL: 01/04/21 at 0539 ROOM: MH02/[facility]02   CHIEF COMPLAINT  Vomiting   HISTORY OF PRESENT ILLNESS  01/04/21 5:52 AM Carlos Guzman is a 35 y.o. male with 2 days of nausea, vomiting and diarrhea.  He has had associated lower abdominal pain which she describes as feeling like acid in his stomach.  He rates that pain is a 6 out of 10.  It is somewhat worse with palpation.  He feels generalized weakness and malaise as well.  He has HIV but is on Biktarvy and states his viral load is undetectable.  He also has a history of cannabis associated hyperemesis syndrome but thinks this may be different because he does not normally get diarrhea with it.   Past Medical History:  Diagnosis Date  . Asthma   . Cannabinoid hyperemesis syndrome   . HIV (human immunodeficiency virus infection) (HCC)   . Pancreatitis   . Substance abuse [facility])     Past Surgical History:  Procedure Laterality Date  . NO PAST SURGERIES      Family History  Problem Relation Age of Onset  . CAD Neg Hx   . Diabetes Neg Hx     Social History   Tobacco Use  . Smoking status: Current Every Day Smoker    Packs/day: 1.00    Types: Cigarettes  . Smokeless tobacco: Never Used  Vaping Use  . Vaping Use: Never used  Substance Use Topics  . Alcohol use: Yes    Comment: quit a couple months ago   . Drug use: Yes    Types: Marijuana    Prior to Admission medications   Medication Sig Start Date End Date Taking? Authorizing Provider  ondansetron (ZOFRAN) 4 MG tablet Take 1 tablet (4 mg total) by mouth every 8 (eight) hours as needed for nausea or vomiting. 01/04/21  Yes Tegeler, Canary Brim, MD  bictegravir-emtricitabine-tenofovir AF (BIKTARVY) 50-200-25 MG TABS tablet Take 1 tablet by mouth daily.  04/03/19   [provider]  dicyclomine (BENTYL) 20 MG tablet Take 1 tablet (20 mg total) by mouth 2 (two) times  daily. 01/15/20 11/17/20  Gailen Shelter, PA  famotidine (PEPCID) 20 MG tablet Take 1 tablet (20 mg total) by mouth 2 (two) times daily. 10/11/17 06/10/19  Elson Areas, PA-C  metoCLOPramide (REGLAN) 10 MG tablet Take 1 tablet (10 mg total) by mouth every 6 (six) hours as needed for nausea or vomiting. Patient not taking: No sig reported 06/10/19 11/17/20  Koralynn Greenspan, Jonny Ruiz, MD  sucralfate (CARAFATE) 1 g tablet Take 1 tablet (1 g total) by mouth 4 (four) times daily -  with meals and at bedtime. 06/10/19 06/10/19  Thomos Domine, Jonny Ruiz, MD    Allergies Patient has no known allergies.   REVIEW OF SYSTEMS  Negative except as noted here or in the History of Present Illness.   PHYSICAL EXAMINATION  Initial Vital Signs Blood pressure (!) 148/113, pulse (!) 119, temperature 98.9 F (37.2 C), temperature source Tympanic, resp. rate 18, height 6\' 1"  (1.854 m), weight 77.1 kg, SpO2 100 %.  Examination General: Well-developed, well-nourished male in no acute distress; appearance consistent with age of record HENT: normocephalic; atraumatic Eyes: pupils equal, round and reactive to light; extraocular muscles intact Neck: supple Heart: regular rate and rhythm; tachycardia Lungs: clear to auscultation bilaterally Abdomen: soft; nondistended; mild lower abdominal tenderness; no masses or hepatosplenomegaly; bowel sounds present Extremities: No deformity; full range  of motion; pulses normal Neurologic: Awake, alert and oriented; motor function intact in all extremities and symmetric; no facial droop Skin: Warm and dry Psychiatric: Flat affect   RESULTS  Summary of this visit's results, reviewed and interpreted by myself:   EKG Interpretation  Date/Time:    Ventricular Rate:    PR Interval:    QRS Duration:   QT Interval:    QTC Calculation:   R Axis:     Text Interpretation:        Laboratory Studies: Results for orders placed or performed during the hospital encounter of 01/04/21 (from the past 24  hour(s))  CBC with Differential/Platelet     Status: Abnormal   Collection Time: 01/04/21  6:04 AM  Result Value Ref Range   WBC 19.8 (H) 4.0 - 10.5 K/uL   RBC 5.81 4.22 - 5.81 MIL/uL   Hemoglobin 17.9 (H) 13.0 - 17.0 g/dL   HCT 40.9 81.1 - 91.4 %   MCV 86.1 80.0 - 100.0 fL   MCH 30.8 26.0 - 34.0 pg   MCHC 35.8 30.0 - 36.0 g/dL   RDW 78.2 95.6 - 21.3 %   Platelets 395 150 - 400 K/uL   nRBC 0.0 0.0 - 0.2 %   Neutrophils Relative % 75 %   Neutro Abs 14.7 (H) 1.7 - 7.7 K/uL   Lymphocytes Relative 16 %   Lymphs Abs 3.2 0.7 - 4.0 K/uL   Monocytes Relative 8 %   Monocytes Absolute 1.7 (H) 0.1 - 1.0 K/uL   Eosinophils Relative 0 %   Eosinophils Absolute 0.0 0.0 - 0.5 K/uL   Basophils Relative 0 %   Basophils Absolute 0.1 0.0 - 0.1 K/uL   Immature Granulocytes 1 %   Abs Immature Granulocytes 0.11 (H) 0.00 - 0.07 K/uL  Comprehensive metabolic panel     Status: Abnormal   Collection Time: 01/04/21  6:04 AM  Result Value Ref Range   Sodium 127 (L) 135 - 145 mmol/L   Potassium 3.0 (L) 3.5 - 5.1 mmol/L   Chloride 81 (L) 98 - 111 mmol/L   CO2 25 22 - 32 mmol/L   Glucose, Bld 142 (H) 70 - 99 mg/dL   BUN 88 (H) 6 - 20 mg/dL   Creatinine, Ser 0.86 (H) 0.61 - 1.24 mg/dL   Calcium 9.9 8.9 - 57.8 mg/dL   Total Protein 9.6 (H) 6.5 - 8.1 g/dL   Albumin 5.1 (H) 3.5 - 5.0 g/dL   AST 24 15 - 41 U/L   ALT 20 0 - 44 U/L   Alkaline Phosphatase 54 38 - 126 U/L   Total Bilirubin 1.4 (H) 0.3 - 1.2 mg/dL   GFR, Estimated 16 (L) >60 mL/min   Anion gap 21 (H) 5 - 15  Urinalysis, Routine w reflex microscopic Urine, Clean Catch     Status: Abnormal   Collection Time: 01/04/21  8:20 AM  Result Value Ref Range   Color, Urine YELLOW YELLOW   APPearance CLEAR CLEAR   Specific Gravity, Urine 1.025 1.005 - 1.030   pH 5.5 5.0 - 8.0   Glucose, UA NEGATIVE NEGATIVE mg/dL   Hgb urine dipstick SMALL (A) NEGATIVE   Bilirubin Urine SMALL (A) NEGATIVE   Ketones, ur NEGATIVE NEGATIVE mg/dL   Protein, ur 30 (A)  NEGATIVE mg/dL   Nitrite NEGATIVE NEGATIVE   Leukocytes,Ua NEGATIVE NEGATIVE  Urinalysis, Microscopic (reflex)     Status: Abnormal   Collection Time: 01/04/21  8:20 AM  Result Value Ref Range  RBC / HPF 0-5 0 - 5 RBC/hpf   WBC, UA 0-5 0 - 5 WBC/hpf   Bacteria, UA FEW (A) NONE SEEN   Squamous Epithelial / LPF 0-5 0 - 5   Mucus PRESENT    Hyaline Casts, UA PRESENT    Imaging Studies: No results found.  ED COURSE and MDM  Nursing notes, initial and subsequent vitals signs, including pulse oximetry, reviewed and interpreted by myself.  Vitals:   01/04/21 0545 01/04/21 0546 01/04/21 0657 01/04/21 0934  BP: (!) 148/113  (!) 140/94 (!) 158/103  Pulse: (!) 119  (!) 105 (!) 101  Resp: 18  18 18   Temp: 98.9 F (37.2 C)  98.9 F (37.2 C) 98.5 F (36.9 C)  TempSrc: Tympanic  Oral Oral  SpO2: 100%  99% 100%  Weight:  77.1 kg    Height:  6\' 1"  (1.854 m)     Medications  haloperidol lactate (HALDOL) injection 5 mg (has no administration in time range)  lactated ringers bolus 1,000 mL (1,000 mLs Intravenous New Bag/Given 01/04/21 0617)  ondansetron (ZOFRAN) injection 4 mg (4 mg Intravenous Given 01/04/21 0617)   7:00 AM Signed out to Dr. 01/06/21.    PROCEDURES  Procedures   ED DIAGNOSES     ICD-10-CM   1. Nausea vomiting and diarrhea  R11.2    R19.7   2. Dehydration  E86.0   3. AKI (acute kidney injury) (HCC)  N17.9   4. Hypokalemia  E87.6   5. Hyponatremia  E87.1        Chalsea Darko, 01/06/21, MD 01/04/21 2236

## 2021-01-04 NOTE — ED Provider Notes (Signed)
7:02 AM Care assumed from Dr. Read Drivers.  At time of transfer care, patient is waiting for results of metabolic panel and urinalysis.  Previous team has suspicion for a GI bug.  By report, patient's abdomen was benign on exam.  Initially, plan is not to do any CT imaging but reassess patient after medications, fluids, and labs.  If he is feeling better, anticipate discharge home per previous plan.  9:44 AM Urinalysis does not show convincing evidence of infection with no nitrites or leukocytes however, his metabolic panel is very concerning.  He has evidence of acute kidney injury with creatinine going up to 4.61 up from 1.82 last month.  He also has worsened hyponatremia, hypochloremia, and hypokalemia.  We will give oral potassium here.  We offered patient admission for AKI and dehydration for electrolyte repletion, rehydration, and renal monitoring but patient wants to go home.  He says that he is already feeling better and was able to eat and drink here and so he does not want to be admitted.  We discussed the possibility that his creatinine would worsen leading to kidney failure but he still wants to go home.  As he is proving he is able to maintain hydration at home, we will let him be discharged but he understands extremely strict return precautions as well as follow-up instructions to see his PCP in the next day or 2 for repeat creatinine.  Will give prescription for nausea medicine and he will be discharged home.    Clinical Impression: 1. Nausea vomiting and diarrhea   2. Dehydration   3. AKI (acute kidney injury) (HCC)   4. Hypokalemia   5. Hyponatremia     Disposition: Discharge  Condition: Stable  I have discussed the results, Dx and Tx plan with the pt(& family if present). He/she/they expressed understanding and agree(s) with the plan. Discharge instructions discussed at great length. Strict return precautions discussed and pt &/or family have verbalized understanding of the  instructions. No further questions at time of discharge.    New Prescriptions   ONDANSETRON (ZOFRAN) 4 MG TABLET    Take 1 tablet (4 mg total) by mouth every 8 (eight) hours as needed for nausea or vomiting.    Follow Up: [facility] [address] [place] [address] [phone] Schedule an appointment as soon as possible for a visit    [facility] [place]PARTMENT [address] [id number] MG QQPY [place] [address] [phone]       Tiaira Arambula, Canary Brim, MD 01/04/21 [phone]

## 2021-01-04 NOTE — Discharge Instructions (Signed)
Your history and exam today are consistent with acute kidney injury likely from dehydration from the nausea, vomiting, diarrhea.  Chart review shows that you have had kidney injury in the past and this does appear worse.  We offered you admission for fluids, electrolyte repletion, and kidney monitoring however he wanted to go home.  We discussed the possibility of worsening kidney function leading to kidney failure but you still wanted to try treating this at home.  Please use the nausea medicine and maintain hydration at home.  If you start to get more dehydrated or feel worse, please return to the nearest emergency department as you would likely admission at that time.  Please follow-up with your primary doctor as well in the next 24 to 48 hours for repeat labs

## 2021-01-06 ENCOUNTER — Emergency Department (HOSPITAL_BASED_OUTPATIENT_CLINIC_OR_DEPARTMENT_OTHER)
Admission: EM | Admit: 2021-01-06 | Discharge: 2021-01-06 | Disposition: A | Discharge status: Home / Self Care | Payer: Self-pay | Attending: Emergency Medicine | Admitting: Emergency Medicine

## 2021-01-06 ENCOUNTER — Emergency Department (HOSPITAL_BASED_OUTPATIENT_CLINIC_OR_DEPARTMENT_OTHER): Payer: Self-pay

## 2021-01-06 ENCOUNTER — Encounter (HOSPITAL_BASED_OUTPATIENT_CLINIC_OR_DEPARTMENT_OTHER): Payer: Self-pay

## 2021-01-06 DIAGNOSIS — F17210 Nicotine dependence, cigarettes, uncomplicated: Secondary | ICD-10-CM | POA: Insufficient documentation

## 2021-01-06 DIAGNOSIS — J45909 Unspecified asthma, uncomplicated: Secondary | ICD-10-CM | POA: Insufficient documentation

## 2021-01-06 DIAGNOSIS — R112 Nausea with vomiting, unspecified: Secondary | ICD-10-CM | POA: Insufficient documentation

## 2021-01-06 DIAGNOSIS — Z21 Asymptomatic human immunodeficiency virus [HIV] infection status: Secondary | ICD-10-CM | POA: Insufficient documentation

## 2021-01-06 DIAGNOSIS — R1011 Right upper quadrant pain: Secondary | ICD-10-CM

## 2021-01-06 DIAGNOSIS — R1010 Upper abdominal pain, unspecified: Secondary | ICD-10-CM | POA: Insufficient documentation

## 2021-01-06 DIAGNOSIS — R1013 Epigastric pain: Secondary | ICD-10-CM | POA: Insufficient documentation

## 2021-01-06 LAB — CBC WITH DIFFERENTIAL/PLATELET
Abs Immature Granulocytes: 0.06 10*3/uL (ref 0.00–0.07)
Basophils Absolute: 0.1 10*3/uL (ref 0.0–0.1)
Basophils Relative: 0 %
Eosinophils Absolute: 0.1 10*3/uL (ref 0.0–0.5)
Eosinophils Relative: 0 %
HCT: 48 % (ref 39.0–52.0)
Hemoglobin: 17.1 g/dL — ABNORMAL HIGH (ref 13.0–17.0)
Immature Granulocytes: 0 %
Lymphocytes Relative: 22 %
Lymphs Abs: 3.5 10*3/uL (ref 0.7–4.0)
MCH: 31.3 pg (ref 26.0–34.0)
MCHC: 35.6 g/dL (ref 30.0–36.0)
MCV: 87.9 fL (ref 80.0–100.0)
Monocytes Absolute: 1.5 10*3/uL — ABNORMAL HIGH (ref 0.1–1.0)
Monocytes Relative: 9 %
Neutro Abs: 10.9 10*3/uL — ABNORMAL HIGH (ref 1.7–7.7)
Neutrophils Relative %: 69 %
Platelets: 350 10*3/uL (ref 150–400)
RBC: 5.46 MIL/uL (ref 4.22–5.81)
RDW: 12.2 % (ref 11.5–15.5)
WBC: 16 10*3/uL — ABNORMAL HIGH (ref 4.0–10.5)
nRBC: 0 % (ref 0.0–0.2)

## 2021-01-06 LAB — COMPREHENSIVE METABOLIC PANEL
ALT: 18 U/L (ref 0–44)
AST: 21 U/L (ref 15–41)
Albumin: 5.1 g/dL — ABNORMAL HIGH (ref 3.5–5.0)
Alkaline Phosphatase: 53 U/L (ref 38–126)
Anion gap: 19 — ABNORMAL HIGH (ref 5–15)
BUN: 42 mg/dL — ABNORMAL HIGH (ref 6–20)
CO2: 29 mmol/L (ref 22–32)
Calcium: 10.3 mg/dL (ref 8.9–10.3)
Chloride: 81 mmol/L — ABNORMAL LOW (ref 98–111)
Creatinine, Ser: 1.95 mg/dL — ABNORMAL HIGH (ref 0.61–1.24)
GFR, Estimated: 45 mL/min — ABNORMAL LOW (ref >60)
Glucose, Bld: 137 mg/dL — ABNORMAL HIGH (ref 70–99)
Potassium: 3.2 mmol/L — ABNORMAL LOW (ref 3.5–5.1)
Sodium: 129 mmol/L — ABNORMAL LOW (ref 135–145)
Total Bilirubin: 1.2 mg/dL (ref 0.3–1.2)
Total Protein: 9.3 g/dL — ABNORMAL HIGH (ref 6.5–8.1)

## 2021-01-06 LAB — URINALYSIS, MICROSCOPIC (REFLEX)

## 2021-01-06 LAB — URINALYSIS, ROUTINE W REFLEX MICROSCOPIC
Glucose, UA: 100 mg/dL — AB
Ketones, ur: 15 mg/dL — AB
Leukocytes,Ua: NEGATIVE
Nitrite: NEGATIVE
Protein, ur: 100 mg/dL — AB
Specific Gravity, Urine: >1.03 — ABNORMAL HIGH (ref 1.005–1.030)
pH: 5.5 (ref 5.0–8.0)

## 2021-01-06 LAB — LIPASE, BLOOD: Lipase: 40 U/L (ref 11–51)

## 2021-01-06 MED ORDER — FAMOTIDINE IN NACL 20-0.9 MG/50ML-% IV SOLN
20.0000 mg | Freq: Once | INTRAVENOUS | Status: AC
  Administered 2021-01-06: 20 mg via INTRAVENOUS
  Filled 2021-01-06: qty 50

## 2021-01-06 MED ORDER — LORAZEPAM 2 MG/ML IJ SOLN: 1.0000 mg | Freq: Once | INTRAMUSCULAR | Status: DC

## 2021-01-06 MED ORDER — ONDANSETRON HCL 4 MG/2ML IJ SOLN
4.0000 mg | Freq: Once | INTRAMUSCULAR | Status: AC
  Administered 2021-01-06: 4 mg via INTRAVENOUS
  Filled 2021-01-06: qty 2

## 2021-01-06 MED ORDER — FAMOTIDINE 20 MG PO TABS: 20.0000 mg | ORAL_TABLET | Freq: Every day | ORAL | 0 refills | Status: DC

## 2021-01-06 MED ORDER — SODIUM CHLORIDE 0.9 % IV BOLUS
1000.0000 mL | Freq: Once | INTRAVENOUS | Status: AC
  Administered 2021-01-06: 1000 mL via INTRAVENOUS

## 2021-01-06 NOTE — ED Notes (Signed)
Given ginger ale 

## 2021-01-06 NOTE — ED Provider Notes (Signed)
[facility] EMERGENCY DEPARTMENT Provider Note   CSN: [id number] Arrival date & time: 01/06/21  9924     History Chief Complaint  Patient presents with  . Abdominal Pain    Carlos Guzman is a 35 y.o. male.  HPI   35 year old male with past medical history of daily cannabis use, HIV, pancreatitis presents the emergency department with concern for nausea and vomiting.  Patient was seen a couple days ago with nausea/vomiting/diarrhea.  He was found to have an AKI with a creatinine over 4.  He was hydrated and treated symptomatically, admission was offered however the patient wanted to try to go home to orally rehydrate.  Patient states has been able to drink fluids but still cannot keep down any food.  He states sometimes he throws up the liquid as well.  Currently he does not have any diarrhea.  He denies any fever.  He has some aching in the upper abdomen but denies any acute pain.  No genitourinary symptoms.  Past Medical History:  Diagnosis Date  . Asthma   . Cannabinoid hyperemesis syndrome   . HIV (human immunodeficiency virus infection) (HCC)   . Pancreatitis   . Substance abuse [facility])     Patient Active Problem List   Diagnosis Date Noted  . ARF (acute renal failure) (HCC) 07/29/2019  . Hyperkalemia 07/29/2019  . Left upper quadrant abdominal pain 07/29/2019  . Metabolic acidosis, increased anion gap 07/29/2019  . Hyperbilirubinemia 07/29/2019  . Hyponatremia 07/29/2019  . Rhabdomyolysis 07/24/2019  . AKI (acute kidney injury) (HCC) 07/23/2019  . HIV (human immunodeficiency virus infection) (HCC) 07/23/2019  . Cannabinoid hyperemesis syndrome 07/23/2019  . Hypokalemia 07/23/2019  . Leukocytosis 07/23/2019  . Tobacco abuse 07/23/2019  . 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 NO PAST SURGERIES         Family History  Problem Relation Age of Onset  . CAD Neg Hx   . Diabetes Neg Hx     Social  History   Tobacco Use  . Smoking status: Current Every Day Smoker    Packs/day: 1.00    Types: Cigarettes  . Smokeless tobacco: Never Used  Vaping Use  . Vaping Use: Never used  Substance Use Topics  . Alcohol use: Yes    Comment: quit a couple months ago   . Drug use: Yes    Types: Marijuana    Home Medications Prior to Admission medications   Medication Sig Start Date End Date Taking? Authorizing Provider  bictegravir-emtricitabine-tenofovir AF (BIKTARVY) 50-200-25 MG TABS tablet Take 1 tablet by mouth daily.  04/03/19   [provider]  ondansetron (ZOFRAN) 4 MG tablet Take 1 tablet (4 mg total) by mouth every 8 (eight) hours as needed for nausea or vomiting. 01/04/21   Tegeler, Canary Brim, MD  dicyclomine (BENTYL) 20 MG tablet Take 1 tablet (20 mg total) by mouth 2 (two) times daily. 01/15/20 11/17/20  Gailen Shelter, PA  famotidine (PEPCID) 20 MG tablet Take 1 tablet (20 mg total) by mouth 2 (two) times daily. 10/11/17 06/10/19  Elson Areas, PA-C  metoCLOPramide (REGLAN) 10 MG tablet Take 1 tablet (10 mg total) by mouth every 6 (six) hours as needed for nausea or vomiting. Patient not taking: No sig reported 06/10/19 11/17/20  Molpus, Jonny Ruiz, MD  sucralfate (CARAFATE) 1 g tablet Take 1 tablet (1 g total) by mouth 4 (four) times daily -  with meals and at  bedtime. 06/10/19 06/10/19  Molpus, Jonny Ruiz, MD    Allergies    Patient has no known allergies.  Review of Systems   Review of Systems  Constitutional: Positive for fatigue. Negative for chills and fever.  HENT: Negative for congestion.   Eyes: Negative for visual disturbance.  Respiratory: Negative for shortness of breath.   Cardiovascular: Negative for chest pain.  Gastrointestinal: Positive for abdominal pain, nausea and vomiting. Negative for blood in stool, constipation and diarrhea.  Genitourinary: Negative for dysuria and hematuria.  Skin: Negative for rash.  Neurological: Negative for headaches.    Physical  Exam Updated Vital Signs BP (!) 153/99   Pulse 95   Temp 97.7 F (36.5 C) (Oral)   Resp 18   Ht 6\' 1"  (1.854 m)   Wt 79.4 kg   SpO2 99%   BMI 23.09 kg/m   Physical Exam Vitals and nursing note reviewed.  Constitutional:      Appearance: Normal appearance.  HENT:     Head: Normocephalic.     Mouth/Throat:     Mouth: Mucous membranes are moist.  Cardiovascular:     Rate and Rhythm: Normal rate.  Pulmonary:     Effort: Pulmonary effort is normal. No respiratory distress.  Abdominal:     Palpations: Abdomen is soft.     Tenderness: There is abdominal tenderness in the epigastric area. There is no guarding or rebound.  Skin:    General: Skin is warm.  Neurological:     Mental Status: He is alert and oriented to person, place, and time. Mental status is at baseline.  Psychiatric:        Mood and Affect: Mood normal.     ED Results / Procedures / Treatments   Labs (all labs ordered are listed, but only abnormal results are displayed) Labs Reviewed  CBC WITH DIFFERENTIAL/PLATELET - Abnormal; Notable for the following components:      Result Value   WBC 16.0 (*)    Hemoglobin 17.1 (*)    Neutro Abs 10.9 (*)    Monocytes Absolute 1.5 (*)    All other components within normal limits  COMPREHENSIVE METABOLIC PANEL - Abnormal; Notable for the following components:   Sodium 129 (*)    Potassium 3.2 (*)    Chloride 81 (*)    Glucose, Bld 137 (*)    BUN 42 (*)    Creatinine, Ser 1.95 (*)    Total Protein 9.3 (*)    Albumin 5.1 (*)    GFR, Estimated 45 (*)    Anion gap 19 (*)    All other components within normal limits  LIPASE, BLOOD  URINALYSIS, ROUTINE W REFLEX MICROSCOPIC    EKG None  Radiology No results found.  Procedures Procedures   Medications Ordered in ED Medications  sodium chloride 0.9 % bolus 1,000 mL (has no administration in time range)  famotidine (PEPCID) IVPB 20 mg premix (has no administration in time range)  ondansetron (ZOFRAN)  injection 4 mg (has no administration in time range)  LORazepam (ATIVAN) injection 1 mg (has no administration in time range)    ED Course  I have reviewed the triage vital signs and the nursing notes.  Pertinent labs & imaging results that were available during my care of the patient were reviewed by me and considered in my medical decision making (see chart for details).    MDM Rules/Calculators/A&P  35 year old male presents emergency department nausea/vomiting.  Possible hyperemesis syndrome, admittedly uses marijuana daily.  Afebrile, abdomen is benign.  Was seen here couple days ago with the same complaints, had an AKI with a creatinine over 4.  Patient has been able to orally rehydrate but continues to have nausea with intermittent vomiting.  Vitals are stable here, abdomen is nonacute.  Blood work shows significant improvement of creatinine to 1.95 as well as improvement of hyponatremia up to 129.  Hypokalemia has improved up to 3.2 as well.  GFR is improving and leukocytosis has decreased.  After medications patient has been able to eat and drink here in the department.  Lipase is normal, ultrasound of the abdomen shows no abnormality with the gallbladder.  He is sitting in bed, appears well, no further nausea/vomiting in the department.  With significant improvement of his labs and ability to p.o. I do not feel he needs admission.  Patient will be discharged and treated as an outpatient.  Discharge plan and strict return to ED precautions discussed, patient verbalizes understanding and agreement.  Final Clinical Impression(s) / ED Diagnoses Final diagnoses:  None    Rx / DC Orders ED Discharge Orders    None       Rozelle Logan, DO 01/06/21 1113

## 2021-01-06 NOTE — ED Triage Notes (Signed)
Pt was seen here 01/04/21. Offered admission for AKI and dehydration, refused. States he has not felt any better. Continues to have N/V and abd pain.

## 2021-01-06 NOTE — Discharge Instructions (Signed)
You have been seen and discharged from the emergency department.  Take Pepcid, Zofran as directed.  Stay well-hydrated.  Follow-up with your primary provider for reevaluation and further care. Take home medications as prescribed. If you have any worsening symptoms or further concerns for your health please return to an emergency department for further evaluation.

## 2021-03-26 ENCOUNTER — Other Ambulatory Visit: Payer: Self-pay

## 2021-03-26 ENCOUNTER — Encounter (HOSPITAL_BASED_OUTPATIENT_CLINIC_OR_DEPARTMENT_OTHER): Payer: Self-pay | Admitting: *Deleted

## 2021-03-26 ENCOUNTER — Emergency Department (HOSPITAL_BASED_OUTPATIENT_CLINIC_OR_DEPARTMENT_OTHER): Payer: Self-pay

## 2021-03-26 ENCOUNTER — Emergency Department (HOSPITAL_BASED_OUTPATIENT_CLINIC_OR_DEPARTMENT_OTHER)
Admission: EM | Admit: 2021-03-26 | Discharge: 2021-03-26 | Disposition: A | Discharge status: Home / Self Care | Payer: Self-pay | Attending: Emergency Medicine | Admitting: Emergency Medicine

## 2021-03-26 DIAGNOSIS — R1084 Generalized abdominal pain: Secondary | ICD-10-CM | POA: Insufficient documentation

## 2021-03-26 DIAGNOSIS — Z21 Asymptomatic human immunodeficiency virus [HIV] infection status: Secondary | ICD-10-CM | POA: Insufficient documentation

## 2021-03-26 DIAGNOSIS — D72829 Elevated white blood cell count, unspecified: Secondary | ICD-10-CM | POA: Insufficient documentation

## 2021-03-26 DIAGNOSIS — J45909 Unspecified asthma, uncomplicated: Secondary | ICD-10-CM | POA: Insufficient documentation

## 2021-03-26 DIAGNOSIS — F17210 Nicotine dependence, cigarettes, uncomplicated: Secondary | ICD-10-CM | POA: Insufficient documentation

## 2021-03-26 DIAGNOSIS — R112 Nausea with vomiting, unspecified: Secondary | ICD-10-CM | POA: Insufficient documentation

## 2021-03-26 LAB — COMPREHENSIVE METABOLIC PANEL
ALT: 19 U/L (ref 0–44)
AST: 18 U/L (ref 15–41)
Albumin: 5.2 g/dL — ABNORMAL HIGH (ref 3.5–5.0)
Alkaline Phosphatase: 62 U/L (ref 38–126)
Anion gap: 15 (ref 5–15)
BUN: 32 mg/dL — ABNORMAL HIGH (ref 6–20)
CO2: 30 mmol/L (ref 22–32)
Calcium: 9.9 mg/dL (ref 8.9–10.3)
Chloride: 86 mmol/L — ABNORMAL LOW (ref 98–111)
Creatinine, Ser: 1.79 mg/dL — ABNORMAL HIGH (ref 0.61–1.24)
GFR, Estimated: 50 mL/min — ABNORMAL LOW (ref >60)
Glucose, Bld: 116 mg/dL — ABNORMAL HIGH (ref 70–99)
Potassium: 2.8 mmol/L — ABNORMAL LOW (ref 3.5–5.1)
Sodium: 131 mmol/L — ABNORMAL LOW (ref 135–145)
Total Bilirubin: 1 mg/dL (ref 0.3–1.2)
Total Protein: 9.5 g/dL — ABNORMAL HIGH (ref 6.5–8.1)

## 2021-03-26 LAB — URINALYSIS, ROUTINE W REFLEX MICROSCOPIC
Glucose, UA: NEGATIVE mg/dL
Ketones, ur: 15 mg/dL — AB
Leukocytes,Ua: NEGATIVE
Nitrite: NEGATIVE
Protein, ur: 100 mg/dL — AB
Specific Gravity, Urine: >1.03 — ABNORMAL HIGH (ref 1.005–1.030)
pH: 6 (ref 5.0–8.0)

## 2021-03-26 LAB — CBC
HCT: 53.2 % — ABNORMAL HIGH (ref 39.0–52.0)
Hemoglobin: 18.5 g/dL — ABNORMAL HIGH (ref 13.0–17.0)
MCH: 30.2 pg (ref 26.0–34.0)
MCHC: 34.8 g/dL (ref 30.0–36.0)
MCV: 86.8 fL (ref 80.0–100.0)
Platelets: 370 10*3/uL (ref 150–400)
RBC: 6.13 MIL/uL — ABNORMAL HIGH (ref 4.22–5.81)
RDW: 13.6 % (ref 11.5–15.5)
WBC: 17.7 10*3/uL — ABNORMAL HIGH (ref 4.0–10.5)
nRBC: 0 % (ref 0.0–0.2)

## 2021-03-26 LAB — URINALYSIS, MICROSCOPIC (REFLEX)

## 2021-03-26 LAB — LIPASE, BLOOD: Lipase: 32 U/L (ref 11–51)

## 2021-03-26 MED ORDER — PANTOPRAZOLE SODIUM 40 MG IV SOLR
40.0000 mg | Freq: Once | INTRAVENOUS | Status: AC
  Administered 2021-03-26: 40 mg via INTRAVENOUS
  Filled 2021-03-26: qty 40

## 2021-03-26 MED ORDER — FENTANYL CITRATE (PF) 100 MCG/2ML IJ SOLN
75.0000 ug | Freq: Once | INTRAMUSCULAR | Status: AC
  Administered 2021-03-26: 75 ug via INTRAVENOUS
  Filled 2021-03-26: qty 2

## 2021-03-26 MED ORDER — IOHEXOL 300 MG/ML  SOLN
100.0000 mL | Freq: Once | INTRAMUSCULAR | Status: AC | PRN
  Administered 2021-03-26: 100 mL via INTRAVENOUS

## 2021-03-26 MED ORDER — ONDANSETRON HCL 4 MG/2ML IJ SOLN
4.0000 mg | Freq: Once | INTRAMUSCULAR | Status: AC
  Administered 2021-03-26: 4 mg via INTRAVENOUS
  Filled 2021-03-26: qty 2

## 2021-03-26 MED ORDER — SODIUM CHLORIDE 0.9 % IV BOLUS
1000.0000 mL | Freq: Once | INTRAVENOUS | Status: AC
  Administered 2021-03-26: 1000 mL via INTRAVENOUS

## 2021-03-26 MED ORDER — DICYCLOMINE HCL 20 MG PO TABS: 20.0000 mg | ORAL_TABLET | Freq: Two times a day (BID) | ORAL | 0 refills | Status: DC

## 2021-03-26 MED ORDER — MORPHINE SULFATE (PF) 4 MG/ML IV SOLN
4.0000 mg | Freq: Once | INTRAVENOUS | Status: AC
  Administered 2021-03-26: 4 mg via INTRAVENOUS
  Filled 2021-03-26: qty 1

## 2021-03-26 NOTE — ED Provider Notes (Signed)
[facility] EMERGENCY DEPARTMENT Provider Note   CSN: [id number] Arrival date & time: 03/26/21  1314     History Chief Complaint  Patient presents with   Abdominal Pain    Carlos Guzman is a 35 y.o. male with PMH/o HIV, pancreatitis, substance abuse, cannabinoid hyperemesis syndrome who presents for evaluation of abdominal pain, nausea/vomiting x4 days.  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  He does report that a few days prior to his symptom onset, he drank 1/5 of [place].  Denies any other alcohol or drug use.  States he has not had any blood in the emesis.  No diarrhea.  Denies any fevers, chest pain, difficulty breathing, dysuria or hematuria.  The history is provided by the patient.      Past Medical History:  Diagnosis Date   Asthma    Cannabinoid hyperemesis syndrome    HIV (human immunodeficiency virus infection) (HCC)    Pancreatitis    Substance abuse (HCC)     Patient Active Problem List   Diagnosis Date Noted   ARF (acute renal failure) (HCC) 07/29/2019   Hyperkalemia 07/29/2019   Left upper quadrant abdominal pain 07/29/2019   Metabolic acidosis, increased anion gap 07/29/2019   Hyperbilirubinemia 07/29/2019   Hyponatremia 07/29/2019   Rhabdomyolysis 07/24/2019   AKI (acute kidney injury) (HCC) 07/23/2019   HIV (human immunodeficiency virus infection) (HCC) 07/23/2019   Cannabinoid hyperemesis syndrome 07/23/2019   Hypokalemia 07/23/2019   Leukocytosis 07/23/2019   Tobacco abuse 07/23/2019   Alcohol abuse, 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 Authorizing Provider  dicyclomine (BENTYL) 20 MG tablet Take 1 tablet (20 mg total) by mouth 2 (two) times daily for 5 days. 03/26/21 03/31/21 Yes Maxwell Caul, PA-C  bictegravir-emtricitabine-tenofovir AF (BIKTARVY) 50-200-25 MG TABS tablet Take 1 tablet by mouth daily.  04/03/19   [provider]  famotidine (PEPCID) 20 MG tablet Take 1 tablet (20 mg total) by mouth daily. 01/06/21   Horton, Clabe Seal, DO  ondansetron (ZOFRAN) 4 MG tablet Take 1 tablet (4 mg total) by mouth every 8 (eight) hours as needed for nausea or vomiting. 01/04/21   Tegeler, Canary Brim, MD  metoCLOPramide (REGLAN) 10 MG tablet Take 1 tablet (10 mg total) by mouth every 6 (six) hours as needed for nausea or vomiting. Patient not taking: No sig reported 06/10/19 11/17/20  Molpus, Jonny Ruiz, MD  sucralfate (CARAFATE) 1 g tablet Take 1 tablet (1 g total) by mouth 4 (four) times daily -  with meals and at bedtime. 06/10/19 06/10/19  Molpus, Jonny Ruiz, MD    Allergies    Patient has no known allergies.  Review of Systems   Review of Systems  Constitutional:  Negative for fever.  Respiratory:  Negative for cough and shortness of breath.  Cardiovascular:  Negative for chest pain.  Gastrointestinal:  Positive for abdominal pain, nausea and vomiting. Negative for diarrhea.  Genitourinary:  Negative for dysuria and hematuria.  Neurological:  Negative for headaches.  All other systems reviewed and are negative.  Physical Exam Updated Vital Signs BP (!) 181/106   Pulse 81   Temp 98.3 F (36.8 C) (Oral)   Resp 11   Ht  (1.854 m)   Wt 81.6 kg   SpO2 99%   BMI 23.75 kg/m   Physical Exam Vitals and nursing note reviewed.  Constitutional:      Appearance: Normal appearance. He is well-developed.  HENT:     Head: Normocephalic and atraumatic.  Eyes:     General: Lids  are normal.     Conjunctiva/sclera: Conjunctivae normal.     Pupils: Pupils are equal, round, and reactive to light.  Cardiovascular:     Rate and Rhythm: Normal rate and regular rhythm.     Pulses: Normal pulses.     Heart sounds: Normal heart sounds. No murmur heard.   No friction rub. No gallop.  Pulmonary:     Effort: Pulmonary effort is normal.     Breath sounds: Normal breath sounds.     Comments: Lungs clear to auscultation bilaterally.  Symmetric chest rise.  No wheezing, rales, rhonchi. Abdominal:     Palpations: Abdomen is soft. Abdomen is not rigid.     Tenderness: There is no abdominal tenderness. There is no guarding.     Comments: Abdomen is soft, non-distended.  Diffuse generalized tenderness.  No focal point of tenderness.  No rigidity, guarding.  No CVA tenderness.  Musculoskeletal:        General: Normal range of motion.     Cervical back: Full passive range of motion without pain.  Skin:    General: Skin is warm and dry.     Capillary Refill: Capillary refill takes less than 2 seconds.  Neurological:     Mental Status: He is alert and oriented to person, place, and time.  Psychiatric:        Speech: Speech normal.    ED Results / Procedures / Treatments   Labs (all labs ordered are listed, but only abnormal results are displayed) Labs Reviewed  COMPREHENSIVE METABOLIC PANEL - Abnormal; Notable for the following components:      Result Value   Sodium 131 (*)    Potassium 2.8 (*)    Chloride 86 (*)    Glucose, Bld 116 (*)    BUN 32 (*)    Creatinine, Ser 1.79 (*)    Total Protein 9.5 (*)    Albumin 5.2 (*)    GFR, Estimated 50 (*)    All other components within normal limits  CBC - Abnormal; Notable for the following components:   WBC 17.7 (*)    RBC 6.13 (*)    Hemoglobin 18.5 (*)    HCT 53.2 (*)    All other components within normal limits  URINALYSIS, ROUTINE W REFLEX MICROSCOPIC - Abnormal; Notable for the following components:   APPearance HAZY  (*)    Specific Gravity, Urine >1.030 (*)    Hgb urine dipstick SMALL (*)    Bilirubin Urine MODERATE (*)    Ketones, ur 15 (*)    Protein, ur 100 (*)    All other components within normal limits  URINALYSIS, MICROSCOPIC (REFLEX) - Abnormal; Notable for the following components:   Bacteria, UA FEW (*)    All  other components within normal limits  LIPASE, BLOOD    EKG EKG Interpretation  Date/Time:  Monday March 26 2021 14:56:12 EDT Ventricular Rate:  84 PR Interval:  142 QRS Duration: 97 QT Interval:  450 QTC Calculation: 532 R Axis:   73 Text Interpretation: Sinus rhythm Consider right atrial enlargement Left ventricular hypertrophy Nonspecific T abnormalities, lateral leads Prolonged QT interval Confirmed by Marianna Fuss ([id number]) on 03/26/2021 3:03:32 PM  Radiology CT Abdomen Pelvis W Contrast  Result Date: 03/26/2021 CLINICAL DATA:  Acute abdominal pain.  Nausea and vomiting. EXAM: CT ABDOMEN AND PELVIS WITH CONTRAST TECHNIQUE: Multidetector CT imaging of the abdomen and pelvis was performed using the standard protocol following bolus administration of intravenous contrast. CONTRAST:  OMNIPAQUE IOHEXOL 300 MG/ML  SOLN COMPARISON:  Most recent CT 01/13/2020, 10/14/2019 FINDINGS: Lower chest: The lung bases are clear. No focal airspace disease or pleural effusion. Heart is normal in size. Hepatobiliary: Occasional tiny subcentimeter hepatic hypodensities are too small to accurately characterize but likely cysts. The area of an early enhancement in the right lobe of the liver on prior exam is not seen currently. Gallbladder physiologically distended, no calcified stone. No biliary dilatation. Pancreas: No ductal dilatation or inflammation. Homogeneous pancreatic attenuation and enhancement. Spleen: Normal in size without focal abnormality. Adrenals/Urinary Tract: Normal adrenal glands. No hydronephrosis or perinephric edema. Homogeneous renal enhancement. No evidence of renal stone  or focal lesion. Urinary bladder is partially distended without wall thickening. Stomach/Bowel: The appendix is borderline dilated measuring 7 mm. Small amount of intraluminal fluid with mild appendiceal enhancement with equivocal wall thickening, best appreciated on coronal series 5 images 56-58. No definite periappendiceal fat stranding. There is air in the more distal appendix. There is fluid within the cecum and ascending colon with equivocal colonic hyperemia. No definite pericolonic edema. Small volume of formed stool in the transverse colon, descending and sigmoid colon are nondistended. Mild wall thickening of the distal esophagus stomach is unremarkable no small bowel dilatation or obstruction. Vascular/Lymphatic: Normal caliber abdominal aorta. Patent portal vein. No portal venous or mesenteric gas. No enlarged lymph nodes in the abdomen or pelvis. Reproductive: Prostate is unremarkable. Other: No free air, free fluid, or intra-abdominal fluid collection. No abdominal wall hernia. Musculoskeletal: Occasional bone islands in the pelvis. Transitional lumbosacral anatomy. There are no acute or suspicious osseous abnormalities. IMPRESSION: 1. Borderline dilated appendix with intraluminal fluid and equivocal wall thickening. No definite periappendiceal fat stranding. Findings may represent early acute appendicitis in the appropriate clinical setting. Alternatively this may be reactive given the similar findings in the cecum and ascending colon. 2. Fluid within the cecum and ascending colon with minimal colonic hyperemia. This may be secondary to diarrheal illness or mild colitis. 3. Mild wall thickening of the distal esophagus, can be seen with reflux or esophagitis. Electronically Signed   By: Narda Rutherford M.D.   On: 03/26/2021 16:43    Procedures Procedures   Medications Ordered in ED Medications  sodium chloride 0.9 % bolus 1,000 mL (0 mLs Intravenous Stopped 03/26/21 1801)  fentaNYL (SUBLIMAZE)  injection 75 mcg (75 mcg Intravenous Given 03/26/21 1456)  ondansetron (ZOFRAN) injection 4 mg (4 mg Intravenous Given 03/26/21 1456)  iohexol (OMNIPAQUE) 300 MG/ML solution 100 mL (100 mLs Intravenous Contrast Given 03/26/21 1604)  pantoprazole (PROTONIX) injection 40 mg (40 mg Intravenous Given 03/26/21 1700)  morphine 4 MG/ML injection 4 mg (4 mg Intravenous Given 03/26/21 1700)    ED Course  I have reviewed the triage vital signs and  the nursing notes.  Pertinent labs & imaging results that were available during my care of the patient were reviewed by me and considered in my medical decision making (see chart for details).    MDM Rules/Calculators/A&P                          35 year old male who presents for evaluation of abdominal pain, nausea/vomiting has been ongoing for 4 days.  History of alcohol induced pancreatitis and does report drinking alcohol prior to onset of symptoms.  No fevers, diarrhea.  No chest pain, urinary complaints.  On initial arrival, he is afebrile, toxic appearing.  Vital signs are stable.  On exam, he has diffuse abdominal tenderness with no rigidity, guarding.  Consider gastritis versus viral GI process versus pancreatitis.  History/physical exam concerning for appendicitis, diverticulitis.  Labs was ordered at triage.  CMP shows potassium of 2.8.  BUN is 32, creatinine of 1.79.  This is consistent with his baseline actually proved from previous ones that he has had.  He has a leukocytosis of 17.7.  Review of his records show that he consistently has leukocytosis.  Urine shows moderate bilirubin, small amount of hemoglobin.  No nitrates, leukocytes.  Lipase is normal.  Reevaluation.  Patient reports he is still having pain.  We will get a CT scan for further evaluation.  CT scan shows borderline dilated appendix with intraluminal equivocal wall thickening.  No evidence of periappendicular fat stranding.  They state this could represent early acute appendicitis or  may be inflammatory reaction given similar findings cecum to Ascending colon.  There is also mention of fluid within the cecum that could be diarrheal illness versus mild colitis.  Patient also has some mild wall thickening of distal esophagus.  Patient's clinical presentation not consistent with appendicitis.  He is diffusely tender with no focal point McBurney's point.  I suspect this is likely related to alcohol gastritis versus irritation.  Reevaluation.  Patient reports improvement in pain after medications here in the ED.  Repeat abdominal exam is improved.  He has no focal tenderness in right lower quadrant.  He is afebrile, has not had any vomiting here in the ED.  His lab work shows leukocytosis but this consistent with his previous lab work.  At this time, do not feel that his presentation represents acute appendicitis.  Patient states he is ready to go home. At this time, patient exhibits no emergent life-threatening condition that require further evaluation in ED. Discussed patient with Dr. Denton Lank who is agreeable to plan. Patient had ample opportunity for questions and discussion. All patient's questions were answered with full understanding. Strict return precautions discussed. Patient expresses understanding and agreement to plan.   Portions of this note were generated with Scientist, clinical (histocompatibility and immunogenetics). Dictation errors may occur despite best attempts at proofreading.   Final Clinical Impression(s) / ED Diagnoses Final diagnoses:  Generalized abdominal pain  Non-intractable vomiting with nausea, unspecified vomiting type    Rx / DC Orders ED Discharge Orders          Ordered    dicyclomine (BENTYL) 20 MG tablet  2 times daily        03/26/21 1800             Rosana Hoes 03/26/21 1917    Cathren Laine, MD 03/28/21 1027

## 2021-03-26 NOTE — ED Triage Notes (Signed)
C/o abd pain n/v x 4 days hx pancreatitis

## 2021-03-26 NOTE — Discharge Instructions (Signed)
Take Bentyl for pain.  Make sure you are staying hydrated drink plenty of fluids.  As we discussed, you may want to avoid spicy, greasy foods since focused on a bland diet.  Return to the Emergency Department immediately if you experience any worsening abdominal pain, fever, persistent nausea and vomiting, inability keep any food down, pain with urination, blood in your urine or any other worsening or concerning symptoms.

## 2021-07-15 ENCOUNTER — Encounter (HOSPITAL_BASED_OUTPATIENT_CLINIC_OR_DEPARTMENT_OTHER): Payer: Self-pay

## 2021-07-15 ENCOUNTER — Emergency Department (HOSPITAL_BASED_OUTPATIENT_CLINIC_OR_DEPARTMENT_OTHER): Payer: Medicaid Other

## 2021-07-15 ENCOUNTER — Other Ambulatory Visit: Payer: Self-pay

## 2021-07-15 ENCOUNTER — Emergency Department (HOSPITAL_BASED_OUTPATIENT_CLINIC_OR_DEPARTMENT_OTHER)
Admission: EM | Admit: 2021-07-15 | Discharge: 2021-07-16 | Disposition: A | Discharge status: Home / Self Care | Payer: Medicaid Other | Attending: Emergency Medicine | Admitting: Emergency Medicine

## 2021-07-15 DIAGNOSIS — Z21 Asymptomatic human immunodeficiency virus [HIV] infection status: Secondary | ICD-10-CM | POA: Diagnosis not present

## 2021-07-15 DIAGNOSIS — F17210 Nicotine dependence, cigarettes, uncomplicated: Secondary | ICD-10-CM | POA: Diagnosis not present

## 2021-07-15 DIAGNOSIS — J45909 Unspecified asthma, uncomplicated: Secondary | ICD-10-CM | POA: Insufficient documentation

## 2021-07-15 DIAGNOSIS — E860 Dehydration: Secondary | ICD-10-CM | POA: Insufficient documentation

## 2021-07-15 DIAGNOSIS — R1084 Generalized abdominal pain: Secondary | ICD-10-CM | POA: Diagnosis present

## 2021-07-15 DIAGNOSIS — R112 Nausea with vomiting, unspecified: Secondary | ICD-10-CM

## 2021-07-15 DIAGNOSIS — F12188 Cannabis abuse with other cannabis-induced disorder: Secondary | ICD-10-CM | POA: Insufficient documentation

## 2021-07-15 DIAGNOSIS — F1290 Cannabis use, unspecified, uncomplicated: Secondary | ICD-10-CM

## 2021-07-15 DIAGNOSIS — E876 Hypokalemia: Secondary | ICD-10-CM | POA: Insufficient documentation

## 2021-07-15 LAB — COMPREHENSIVE METABOLIC PANEL
ALT: 24 U/L (ref 0–44)
AST: 21 U/L (ref 15–41)
Albumin: 5 g/dL (ref 3.5–5.0)
Alkaline Phosphatase: 63 U/L (ref 38–126)
Anion gap: 16 — ABNORMAL HIGH (ref 5–15)
BUN: 21 mg/dL — ABNORMAL HIGH (ref 6–20)
CO2: 30 mmol/L (ref 22–32)
Calcium: 10 mg/dL (ref 8.9–10.3)
Chloride: 84 mmol/L — ABNORMAL LOW (ref 98–111)
Creatinine, Ser: 1.55 mg/dL — ABNORMAL HIGH (ref 0.61–1.24)
GFR, Estimated: 59 mL/min — ABNORMAL LOW (ref >60)
Glucose, Bld: 125 mg/dL — ABNORMAL HIGH (ref 70–99)
Potassium: 2.6 mmol/L — CL (ref 3.5–5.1)
Sodium: 130 mmol/L — ABNORMAL LOW (ref 135–145)
Total Bilirubin: 1.3 mg/dL — ABNORMAL HIGH (ref 0.3–1.2)
Total Protein: 9.1 g/dL — ABNORMAL HIGH (ref 6.5–8.1)

## 2021-07-15 LAB — URINALYSIS, ROUTINE W REFLEX MICROSCOPIC
Glucose, UA: NEGATIVE mg/dL
Hgb urine dipstick: NEGATIVE
Ketones, ur: 15 mg/dL — AB
Leukocytes,Ua: NEGATIVE
Nitrite: NEGATIVE
Protein, ur: 100 mg/dL — AB
Specific Gravity, Urine: >=1.03 (ref 1.005–1.030)
pH: 6 (ref 5.0–8.0)

## 2021-07-15 LAB — CBC
HCT: 51.1 % (ref 39.0–52.0)
Hemoglobin: 17.9 g/dL — ABNORMAL HIGH (ref 13.0–17.0)
MCH: 30.4 pg (ref 26.0–34.0)
MCHC: 35 g/dL (ref 30.0–36.0)
MCV: 86.9 fL (ref 80.0–100.0)
Platelets: 352 10*3/uL (ref 150–400)
RBC: 5.88 MIL/uL — ABNORMAL HIGH (ref 4.22–5.81)
RDW: 13.1 % (ref 11.5–15.5)
WBC: 20.4 10*3/uL — ABNORMAL HIGH (ref 4.0–10.5)
nRBC: 0 % (ref 0.0–0.2)

## 2021-07-15 LAB — LIPASE, BLOOD: Lipase: 38 U/L (ref 11–51)

## 2021-07-15 LAB — URINALYSIS, MICROSCOPIC (REFLEX)

## 2021-07-15 LAB — MAGNESIUM: Magnesium: 2.3 mg/dL (ref 1.7–2.4)

## 2021-07-15 MED ORDER — METOCLOPRAMIDE HCL 5 MG/ML IJ SOLN
10.0000 mg | Freq: Once | INTRAMUSCULAR | Status: AC
  Administered 2021-07-15: 10 mg via INTRAVENOUS
  Filled 2021-07-15: qty 2

## 2021-07-15 MED ORDER — SODIUM CHLORIDE 0.9 % IV BOLUS
1000.0000 mL | Freq: Once | INTRAVENOUS | Status: AC
  Administered 2021-07-15: 1000 mL via INTRAVENOUS

## 2021-07-15 MED ORDER — POTASSIUM CHLORIDE 10 MEQ/100ML IV SOLN
10.0000 meq | Freq: Once | INTRAVENOUS | Status: AC
  Administered 2021-07-16: 10 meq via INTRAVENOUS
  Filled 2021-07-15: qty 100

## 2021-07-15 MED ORDER — SODIUM CHLORIDE 0.9 % IV SOLN: INTRAVENOUS | Status: DC

## 2021-07-15 MED ORDER — POTASSIUM CHLORIDE 10 MEQ/100ML IV SOLN
10.0000 meq | Freq: Once | INTRAVENOUS | Status: AC
  Administered 2021-07-15: 10 meq via INTRAVENOUS
  Filled 2021-07-15: qty 100

## 2021-07-15 MED ORDER — HALOPERIDOL LACTATE 5 MG/ML IJ SOLN
5.0000 mg | Freq: Once | INTRAMUSCULAR | Status: AC
Start: 2021-07-16 — End: 2021-07-15
  Administered 2021-07-15: 5 mg via INTRAVENOUS
  Filled 2021-07-15: qty 1

## 2021-07-15 NOTE — ED Provider Notes (Signed)
[facility] Provider Note   CSN: [id number] Arrival date & time: 07/15/21  1959     History Chief Complaint  Patient presents with   Nausea    Carlos Guzman is a 35 y.o. male.  Patient with a history of hyperemesis cannabinoid syndrome.  HIV pancreatitis substance abuse.  Patient recently admitted October 22 through 24 at [facility] for sepsis secondary to influenza A.  And significant dehydration acute kidney injury.  He was discharged home on October 24.  He was seen last for persistent vomiting and abdominal pain on 9 March 26, 2021.  Patient presents tonight with 3 days of persistent vomiting.  States that he vomited more than 50 times today.  Patient has evidence of some chronic renal failure.  Has evidence of hypokalemia quite frequently looking past in his histories.  Patient denies vomiting any blood.  Patient with a complaint of some generalized abdominal pain.  No diarrhea.  No fevers no upper respiratory symptoms.  Temp here was normal.  Respirations 17 oxygen saturations on room air 100% heart rate was 92.  Blood pressure 146/99.      Past Medical History:  Diagnosis Date   Asthma    Cannabinoid hyperemesis syndrome    HIV (human immunodeficiency virus infection) ([place])    Pancreatitis    Substance abuse ([place])     Patient Active Problem List   Diagnosis Date Noted   ARF (acute renal failure) ([place]) 07/29/2019   Hyperkalemia 07/29/2019   Left upper quadrant abdominal pain 36/62/9476   Metabolic acidosis, increased anion gap 07/29/2019   Hyperbilirubinemia 07/29/2019   Hyponatremia 07/29/2019   Rhabdomyolysis 07/24/2019   AKI (acute kidney injury) ([place]) 07/23/2019   HIV (human immunodeficiency virus infection) ([place]) 07/23/2019   Cannabinoid hyperemesis syndrome 07/23/2019   Hypokalemia 07/23/2019   Leukocytosis 07/23/2019   Tobacco abuse 07/23/2019   Alcohol abuse, 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 Authorizing Provider  bictegravir-emtricitabine-tenofovir AF (BIKTARVY) 50-200-25 MG TABS tablet Take 1 tablet by mouth daily.  04/03/19   [provider]  dicyclomine (BENTYL) 20 MG tablet Take 1 tablet (20 mg total) by mouth 2 (two) times daily for 5 days. 03/26/21 03/31/21  Volanda Napoleon, PA-C  famotidine (PEPCID) 20 MG tablet Take 1 tablet (20 mg total) by mouth daily. 01/06/21   Horton, Alvin Critchley, DO  ondansetron (ZOFRAN) 4 MG tablet Take 1 tablet (4 mg total) by mouth every 8 (eight) hours as needed for nausea or vomiting. 01/04/21   Tegeler, Gwenyth Allegra, MD  metoCLOPramide (REGLAN) 10 MG tablet Take 1 tablet (10 mg total) by mouth every 6 (six) hours as needed for nausea or vomiting. Patient not taking: No sig reported 06/10/19 11/17/20  Molpus, John, MD  sucralfate (CARAFATE) 1 g tablet Take 1 tablet (1  g total) by mouth 4 (four) times daily -  with meals and at bedtime. 06/10/19 06/10/19  Molpus, Jenny Reichmann, MD    Allergies    Patient has no known allergies.  Review of Systems   Review of Systems  Constitutional:  Negative for chills and fever.  HENT:  Negative for ear pain and sore throat.   Eyes:  Negative for pain and visual disturbance.  Respiratory:  Negative for cough and shortness of breath.   Cardiovascular:  Negative for chest pain and palpitations.  Gastrointestinal:  Positive for abdominal pain, nausea and vomiting.  Genitourinary:  Negative for dysuria and hematuria.   Musculoskeletal:  Negative for arthralgias and back pain.  Skin:  Negative for color change and rash.  Neurological:  Negative for seizures and syncope.  All other systems reviewed and are negative.  Physical Exam Updated Vital Signs BP 139/89   Pulse 86   Temp 98.5 F (36.9 C) (Oral)   Resp 16   Ht 1.854 m (6' 1" )   Wt 79.4 kg   SpO2 99%   BMI 23.09 kg/m   Physical Exam Vitals and nursing note reviewed.  Constitutional:      Appearance: Normal appearance. He is well-developed.  HENT:     Head: Normocephalic and atraumatic.     Mouth/Throat:     Mouth: Mucous membranes are dry.  Eyes:     Extraocular Movements: Extraocular movements intact.     Conjunctiva/sclera: Conjunctivae normal.     Pupils: Pupils are equal, round, and reactive to light.  Cardiovascular:     Rate and Rhythm: Normal rate and regular rhythm.     Heart sounds: No murmur heard. Pulmonary:     Effort: Pulmonary effort is normal. No respiratory distress.     Breath sounds: Normal breath sounds.  Abdominal:     Palpations: Abdomen is soft.     Tenderness: There is abdominal tenderness. There is no guarding.     Comments: Generalized abdominal tenderness no guarding  Musculoskeletal:        General: Normal range of motion.     Cervical back: Normal range of motion and neck supple.  Skin:    General: Skin is warm and dry.  Neurological:     General: No focal deficit present.     Mental Status: He is alert and oriented to person, place, and time.    ED Results / Procedures / Treatments   Labs (all labs ordered are listed, but only abnormal results are displayed) Labs Reviewed  COMPREHENSIVE METABOLIC PANEL - Abnormal; Notable for the following components:      Result Value   Sodium 130 (*)    Potassium 2.6 (*)    Chloride 84 (*)    Glucose, Bld 125 (*)    BUN 21 (*)    Creatinine, Ser 1.55 (*)    Total Protein 9.1 (*)    Total Bilirubin 1.3 (*)    GFR, Estimated 59 (*)    Anion gap 16  (*)    All other components within normal limits  CBC - Abnormal; Notable for the following components:   WBC 20.4 (*)    RBC 5.88 (*)    Hemoglobin 17.9 (*)    All other components within normal limits  URINALYSIS, ROUTINE W REFLEX MICROSCOPIC - Abnormal; Notable for the following components:   APPearance HAZY (*)    Bilirubin Urine SMALL (*)    Ketones, ur 15 (*)    Protein, ur 100 (*)  All other components within normal limits  URINALYSIS, MICROSCOPIC (REFLEX) - Abnormal; Notable for the following components:   Bacteria, UA FEW (*)    All other components within normal limits  LIPASE, BLOOD  MAGNESIUM    EKG EKG Interpretation  Date/Time:  Sunday July 15 2021 21:57:10 EST Ventricular Rate:  108 PR Interval:  145 QRS Duration: 100 QT Interval:  324 QTC Calculation: 435 R Axis:   85 Text Interpretation: Sinus tachycardia Right atrial enlargement Probable anteroseptal infarct, old Borderline repolarization abnormality No significant change since last tracing Confirmed by Fredia Sorrow [phone]) on 07/15/2021 11:06:46 PM  Radiology No results found.  Procedures Procedures   Medications Ordered in ED Medications  0.9 %  sodium chloride infusion (has no administration in time range)  potassium chloride 10 mEq in 100 mL IVPB (10 mEq Intravenous New Bag/Given 07/15/21 2354)  potassium chloride 10 mEq in 100 mL IVPB (has no administration in time range)  sodium chloride 0.9 % bolus 1,000 mL (1,000 mLs Intravenous New Bag/Given 07/15/21 2351)  haloperidol lactate (HALDOL) injection 5 mg (5 mg Intravenous Given 07/15/21 2351)  metoCLOPramide (REGLAN) injection 10 mg (10 mg Intravenous Given 07/15/21 2350)    ED Course  I have reviewed the triage vital signs and the nursing notes.  Pertinent labs & imaging results that were available during my care of the patient were reviewed by me and considered in my medical decision making (see chart for details).    MDM  Rules/Calculators/A&P                         CRITICAL CARE Performed by: Fredia Sorrow Total critical care time: 35 minutes Critical care time was exclusive of separately billable procedures and treating other patients. Critical care was necessary to treat or prevent imminent or life-threatening deterioration. Critical care was time spent personally by me on the following activities: development of treatment plan with patient and/or surrogate as well as nursing, discussions with consultants, evaluation of patient's response to treatment, examination of patient, obtaining history from patient or surrogate, ordering and performing treatments and interventions, ordering and review of laboratory studies, ordering and review of radiographic studies, pulse oximetry and re-evaluation of patient's condition.   Patient's potassium here today is 2.6.  Has been as low as 2.8 in the past.  His creatinine is up a little bit at 1.55 but GFR is 59.  Total bili 1.3 but alk phos is normal.  Lipase is normal.  Patient has a leukocytosis 20,000.  But is often elevated.  Hemoglobin seems to be hemoconcentrated 17.9.  Platelets are normal.  Urinalysis normal.  Magnesium normal.  We will treat with IV fluids to include 2 L bolus with CT of the abdomen for the generalized abdominal pain.  Will give 2 rounds of IV potassium.  We will give Reglan and Haldol for the persistent vomiting.  Patient will be turned over to the overnight physician to follow-up on his CT scan.   Final Clinical Impression(s) / ED Diagnoses Final diagnoses:  Cannabinoid hyperemesis syndrome  Hypokalemia  Dehydration    Rx / DC Orders ED Discharge Orders     None        Fredia Sorrow, MD 07/16/21 0006

## 2021-07-15 NOTE — ED Triage Notes (Signed)
N/V x3 days; flank pain. Recent hospitalization 06/30/21 at [facility] for Sepsis, ARF, Flu A

## 2021-07-15 NOTE — ED Notes (Signed)
EDP notified of hypokalemia 

## 2021-07-16 MED ORDER — POTASSIUM CHLORIDE CRYS ER 20 MEQ PO TBCR: 20.0000 meq | EXTENDED_RELEASE_TABLET | Freq: Every day | ORAL | 0 refills | Status: DC

## 2021-07-16 MED ORDER — ONDANSETRON 4 MG PO TBDP: 4.0000 mg | ORAL_TABLET | Freq: Three times a day (TID) | ORAL | 0 refills | Status: DC | PRN

## 2021-07-16 MED ORDER — IOHEXOL 300 MG/ML  SOLN
100.0000 mL | Freq: Once | INTRAMUSCULAR | Status: AC | PRN
  Administered 2021-07-16: 100 mL via INTRAVENOUS

## 2021-07-16 MED ORDER — SODIUM CHLORIDE 0.9 % IV BOLUS: 1000.0000 mL | Freq: Once | INTRAVENOUS | Status: DC

## 2021-07-16 NOTE — ED Provider Notes (Signed)
Patient care assumed at 0000.  Pt here with N/V.  Pending CT scan and po challenge  patient feeling improved after treatment the emergency department no recurrent vomiting. He is requesting discharge home. Will discharge with oral potassium, prn zofran. Discussed discontinuing his marijuana use. Discussed outpatient follow-up and return precautions.   Tilden Fossa, MD 07/16/21 [phone]

## 2021-09-04 DIAGNOSIS — Z5321 Procedure and treatment not carried out due to patient leaving prior to being seen by health care provider: Secondary | ICD-10-CM | POA: Diagnosis not present

## 2021-09-04 DIAGNOSIS — R1084 Generalized abdominal pain: Secondary | ICD-10-CM | POA: Diagnosis not present

## 2021-09-06 ENCOUNTER — Encounter (HOSPITAL_BASED_OUTPATIENT_CLINIC_OR_DEPARTMENT_OTHER): Payer: Self-pay

## 2021-09-06 ENCOUNTER — Other Ambulatory Visit: Payer: Self-pay

## 2021-09-06 ENCOUNTER — Emergency Department (HOSPITAL_BASED_OUTPATIENT_CLINIC_OR_DEPARTMENT_OTHER): Payer: Medicaid Other

## 2021-09-06 ENCOUNTER — Observation Stay (HOSPITAL_BASED_OUTPATIENT_CLINIC_OR_DEPARTMENT_OTHER)
Admission: EM | Admit: 2021-09-06 | Discharge: 2021-09-07 | Disposition: A | Discharge status: Home / Self Care | Payer: Medicaid Other | Attending: Internal Medicine | Admitting: Internal Medicine

## 2021-09-06 DIAGNOSIS — F1290 Cannabis use, unspecified, uncomplicated: Secondary | ICD-10-CM | POA: Diagnosis present

## 2021-09-06 DIAGNOSIS — R000 Tachycardia, unspecified: Secondary | ICD-10-CM | POA: Insufficient documentation

## 2021-09-06 DIAGNOSIS — E860 Dehydration: Secondary | ICD-10-CM | POA: Diagnosis not present

## 2021-09-06 DIAGNOSIS — Z21 Asymptomatic human immunodeficiency virus [HIV] infection status: Secondary | ICD-10-CM | POA: Diagnosis present

## 2021-09-06 DIAGNOSIS — F17210 Nicotine dependence, cigarettes, uncomplicated: Secondary | ICD-10-CM | POA: Insufficient documentation

## 2021-09-06 DIAGNOSIS — E876 Hypokalemia: Secondary | ICD-10-CM | POA: Diagnosis present

## 2021-09-06 DIAGNOSIS — E871 Hypo-osmolality and hyponatremia: Secondary | ICD-10-CM | POA: Diagnosis present

## 2021-09-06 DIAGNOSIS — N183 Chronic kidney disease, stage 3 unspecified: Secondary | ICD-10-CM | POA: Diagnosis present

## 2021-09-06 DIAGNOSIS — Z20822 Contact with and (suspected) exposure to covid-19: Secondary | ICD-10-CM | POA: Insufficient documentation

## 2021-09-06 DIAGNOSIS — R112 Nausea with vomiting, unspecified: Principal | ICD-10-CM | POA: Diagnosis present

## 2021-09-06 DIAGNOSIS — J45909 Unspecified asthma, uncomplicated: Secondary | ICD-10-CM | POA: Diagnosis not present

## 2021-09-06 DIAGNOSIS — N1832 Chronic kidney disease, stage 3b: Secondary | ICD-10-CM | POA: Diagnosis not present

## 2021-09-06 DIAGNOSIS — N179 Acute kidney failure, unspecified: Secondary | ICD-10-CM | POA: Diagnosis not present

## 2021-09-06 DIAGNOSIS — R1013 Epigastric pain: Secondary | ICD-10-CM | POA: Diagnosis not present

## 2021-09-06 DIAGNOSIS — Z79899 Other long term (current) drug therapy: Secondary | ICD-10-CM | POA: Diagnosis not present

## 2021-09-06 DIAGNOSIS — N1831 Chronic kidney disease, stage 3a: Secondary | ICD-10-CM | POA: Diagnosis not present

## 2021-09-06 DIAGNOSIS — B20 Human immunodeficiency virus [HIV] disease: Secondary | ICD-10-CM | POA: Insufficient documentation

## 2021-09-06 DIAGNOSIS — F1410 Cocaine abuse, uncomplicated: Secondary | ICD-10-CM | POA: Diagnosis not present

## 2021-09-06 DIAGNOSIS — D72829 Elevated white blood cell count, unspecified: Secondary | ICD-10-CM | POA: Diagnosis present

## 2021-09-06 DIAGNOSIS — Z720 Tobacco use: Secondary | ICD-10-CM | POA: Diagnosis present

## 2021-09-06 DIAGNOSIS — R9431 Abnormal electrocardiogram [ECG] [EKG]: Secondary | ICD-10-CM | POA: Diagnosis present

## 2021-09-06 LAB — BASIC METABOLIC PANEL
Anion gap: 12 (ref 5–15)
Anion gap: 10 (ref 5–15)
BUN: 65 mg/dL — ABNORMAL HIGH (ref 6–20)
BUN: 54 mg/dL — ABNORMAL HIGH (ref 6–20)
CO2: 28 mmol/L (ref 22–32)
CO2: 28 mmol/L (ref 22–32)
Calcium: 8.9 mg/dL (ref 8.9–10.3)
Calcium: 8.7 mg/dL — ABNORMAL LOW (ref 8.9–10.3)
Chloride: 82 mmol/L — ABNORMAL LOW (ref 98–111)
Chloride: 89 mmol/L — ABNORMAL LOW (ref 98–111)
Creatinine, Ser: 2.7 mg/dL — ABNORMAL HIGH (ref 0.61–1.24)
Creatinine, Ser: 2.07 mg/dL — ABNORMAL HIGH (ref 0.61–1.24)
GFR, Estimated: 30 mL/min — ABNORMAL LOW (ref >60)
GFR, Estimated: 42 mL/min — ABNORMAL LOW (ref >60)
Glucose, Bld: 107 mg/dL — ABNORMAL HIGH (ref 70–99)
Glucose, Bld: 120 mg/dL — ABNORMAL HIGH (ref 70–99)
Potassium: 3.2 mmol/L — ABNORMAL LOW (ref 3.5–5.1)
Potassium: 2.6 mmol/L — CL (ref 3.5–5.1)
Sodium: 122 mmol/L — ABNORMAL LOW (ref 135–145)
Sodium: 127 mmol/L — ABNORMAL LOW (ref 135–145)

## 2021-09-06 LAB — COMPREHENSIVE METABOLIC PANEL
ALT: 21 U/L (ref 0–44)
AST: 24 U/L (ref 15–41)
Albumin: 5.2 g/dL — ABNORMAL HIGH (ref 3.5–5.0)
Alkaline Phosphatase: 56 U/L (ref 38–126)
Anion gap: 18 — ABNORMAL HIGH (ref 5–15)
BUN: 76 mg/dL — ABNORMAL HIGH (ref 6–20)
CO2: 25 mmol/L (ref 22–32)
Calcium: 9.7 mg/dL (ref 8.9–10.3)
Chloride: 78 mmol/L — ABNORMAL LOW (ref 98–111)
Creatinine, Ser: 2.84 mg/dL — ABNORMAL HIGH (ref 0.61–1.24)
GFR, Estimated: 28 mL/min — ABNORMAL LOW (ref >60)
Glucose, Bld: 148 mg/dL — ABNORMAL HIGH (ref 70–99)
Potassium: 2.8 mmol/L — ABNORMAL LOW (ref 3.5–5.1)
Sodium: 121 mmol/L — ABNORMAL LOW (ref 135–145)
Total Bilirubin: 2 mg/dL — ABNORMAL HIGH (ref 0.3–1.2)
Total Protein: 9.2 g/dL — ABNORMAL HIGH (ref 6.5–8.1)

## 2021-09-06 LAB — URINALYSIS, ROUTINE W REFLEX MICROSCOPIC
Glucose, UA: NEGATIVE mg/dL
Ketones, ur: NEGATIVE mg/dL
Leukocytes,Ua: NEGATIVE
Nitrite: NEGATIVE
Protein, ur: 100 mg/dL — AB
Specific Gravity, Urine: >=1.03 (ref 1.005–1.030)
pH: 5.5 (ref 5.0–8.0)

## 2021-09-06 LAB — CK: Total CK: 340 U/L (ref 49–397)

## 2021-09-06 LAB — SODIUM, URINE, RANDOM: Sodium, Ur: <10 mmol/L

## 2021-09-06 LAB — CBC
HCT: 51.5 % (ref 39.0–52.0)
Hemoglobin: 19.1 g/dL — ABNORMAL HIGH (ref 13.0–17.0)
MCH: 30.7 pg (ref 26.0–34.0)
MCHC: 37.1 g/dL — ABNORMAL HIGH (ref 30.0–36.0)
MCV: 82.7 fL (ref 80.0–100.0)
Platelets: 373 10*3/uL (ref 150–400)
RBC: 6.23 MIL/uL — ABNORMAL HIGH (ref 4.22–5.81)
RDW: 13.2 % (ref 11.5–15.5)
WBC: 17.8 10*3/uL — ABNORMAL HIGH (ref 4.0–10.5)
nRBC: 0 % (ref 0.0–0.2)

## 2021-09-06 LAB — PHOSPHORUS: Phosphorus: 3.1 mg/dL (ref 2.5–4.6)

## 2021-09-06 LAB — CREATININE, URINE, RANDOM: Creatinine, Urine: 122.87 mg/dL

## 2021-09-06 LAB — RESP PANEL BY RT-PCR (FLU A&B, COVID) ARPGX2
Influenza A by PCR: NEGATIVE
Influenza B by PCR: NEGATIVE
SARS Coronavirus 2 by RT PCR: NEGATIVE

## 2021-09-06 LAB — URINALYSIS, MICROSCOPIC (REFLEX)

## 2021-09-06 LAB — RAPID URINE DRUG SCREEN, HOSP PERFORMED
Amphetamines: NOT DETECTED
Barbiturates: NOT DETECTED
Benzodiazepines: NOT DETECTED
Cocaine: POSITIVE — AB
Opiates: POSITIVE — AB
Tetrahydrocannabinol: POSITIVE — AB

## 2021-09-06 LAB — ETHANOL: Alcohol, Ethyl (B): <10 mg/dL (ref <10)

## 2021-09-06 LAB — TSH: TSH: 0.726 u[IU]/mL (ref 0.350–4.500)

## 2021-09-06 LAB — LIPASE, BLOOD: Lipase: 43 U/L (ref 11–51)

## 2021-09-06 LAB — MAGNESIUM: Magnesium: 2.6 mg/dL — ABNORMAL HIGH (ref 1.7–2.4)

## 2021-09-06 MED ORDER — BICTEGRAVIR-EMTRICITAB-TENOFOV 50-200-25 MG PO TABS: 1.0000 | ORAL_TABLET | Freq: Every day | ORAL | Status: DC

## 2021-09-06 MED ORDER — ONDANSETRON HCL 4 MG PO TABS: 4.0000 mg | ORAL_TABLET | Freq: Three times a day (TID) | ORAL | 0 refills | Status: DC | PRN

## 2021-09-06 MED ORDER — SODIUM CHLORIDE 0.9 % IV SOLN: INTRAVENOUS | Status: DC

## 2021-09-06 MED ORDER — METOCLOPRAMIDE HCL 5 MG/ML IJ SOLN: 5.0000 mg | Freq: Four times a day (QID) | INTRAMUSCULAR | Status: DC | PRN

## 2021-09-06 MED ORDER — HYDROCODONE-ACETAMINOPHEN 5-325 MG PO TABS
1.0000 | ORAL_TABLET | ORAL | Status: DC | PRN
  Administered 2021-09-06: 22:00:00 2 via ORAL
  Filled 2021-09-06: qty 2

## 2021-09-06 MED ORDER — ONDANSETRON HCL 4 MG/2ML IJ SOLN
4.0000 mg | Freq: Once | INTRAMUSCULAR | Status: AC
  Administered 2021-09-06: 09:00:00 4 mg via INTRAVENOUS
  Filled 2021-09-06: qty 2

## 2021-09-06 MED ORDER — MORPHINE SULFATE (PF) 4 MG/ML IV SOLN
4.0000 mg | Freq: Once | INTRAVENOUS | Status: AC
  Administered 2021-09-06: 13:00:00 4 mg via INTRAVENOUS
  Filled 2021-09-06: qty 1

## 2021-09-06 MED ORDER — SODIUM CHLORIDE 0.9 % IV BOLUS
1000.0000 mL | Freq: Once | INTRAVENOUS | Status: AC
  Administered 2021-09-06: 09:00:00 1000 mL via INTRAVENOUS

## 2021-09-06 MED ORDER — LAMIVUDINE 150 MG PO TABS
300.0000 mg | ORAL_TABLET | Freq: Every day | ORAL | Status: DC
  Administered 2021-09-06 – 2021-09-07 (×2): 300 mg via ORAL
  Filled 2021-09-06 (×2): qty 2

## 2021-09-06 MED ORDER — POTASSIUM CHLORIDE CRYS ER 20 MEQ PO TBCR
20.0000 meq | EXTENDED_RELEASE_TABLET | Freq: Once | ORAL | Status: AC
  Administered 2021-09-06: 15:00:00 20 meq via ORAL
  Filled 2021-09-06: qty 1

## 2021-09-06 MED ORDER — LACTATED RINGERS IV SOLN
INTRAVENOUS | Status: DC
Start: 2021-09-06 — End: 2021-09-07

## 2021-09-06 MED ORDER — FAMOTIDINE IN NACL 20-0.9 MG/50ML-% IV SOLN
20.0000 mg | Freq: Once | INTRAVENOUS | Status: AC
  Administered 2021-09-06: 09:00:00 20 mg via INTRAVENOUS
  Filled 2021-09-06: qty 50

## 2021-09-06 MED ORDER — ONDANSETRON HCL 4 MG PO TABS: 4.0000 mg | ORAL_TABLET | Freq: Four times a day (QID) | ORAL | Status: DC | PRN

## 2021-09-06 MED ORDER — POTASSIUM CHLORIDE 10 MEQ/100ML IV SOLN
10.0000 meq | INTRAVENOUS | Status: AC
  Administered 2021-09-07 (×4): 10 meq via INTRAVENOUS
  Filled 2021-09-06 (×4): qty 100

## 2021-09-06 MED ORDER — MORPHINE SULFATE (PF) 4 MG/ML IV SOLN
4.0000 mg | Freq: Once | INTRAVENOUS | Status: AC
  Administered 2021-09-06: 12:00:00 4 mg via INTRAVENOUS
  Filled 2021-09-06: qty 1

## 2021-09-06 MED ORDER — FENTANYL CITRATE PF 50 MCG/ML IJ SOSY
50.0000 ug | PREFILLED_SYRINGE | Freq: Once | INTRAMUSCULAR | Status: AC
  Administered 2021-09-06: 09:00:00 50 ug via INTRAVENOUS
  Filled 2021-09-06: qty 1

## 2021-09-06 MED ORDER — ONDANSETRON HCL 4 MG/2ML IJ SOLN: 4.0000 mg | Freq: Four times a day (QID) | INTRAMUSCULAR | Status: DC | PRN

## 2021-09-06 MED ORDER — FAMOTIDINE 20 MG PO TABS
20.0000 mg | ORAL_TABLET | Freq: Every day | ORAL | Status: DC
  Administered 2021-09-07: 08:00:00 20 mg via ORAL
  Filled 2021-09-06: qty 1

## 2021-09-06 MED ORDER — DOLUTEGRAVIR SODIUM 50 MG PO TABS
50.0000 mg | ORAL_TABLET | Freq: Every day | ORAL | Status: DC
  Administered 2021-09-06 – 2021-09-07 (×2): 50 mg via ORAL
  Filled 2021-09-06 (×2): qty 1

## 2021-09-06 MED ORDER — LACTATED RINGERS IV BOLUS
1000.0000 mL | Freq: Once | INTRAVENOUS | Status: AC
  Administered 2021-09-06: 11:00:00 1000 mL via INTRAVENOUS

## 2021-09-06 MED ORDER — ACETAMINOPHEN 650 MG RE SUPP: 650.0000 mg | Freq: Four times a day (QID) | RECTAL | Status: DC | PRN

## 2021-09-06 MED ORDER — ACETAMINOPHEN 325 MG PO TABS: 650.0000 mg | ORAL_TABLET | Freq: Four times a day (QID) | ORAL | Status: DC | PRN

## 2021-09-06 MED ORDER — FENTANYL CITRATE PF 50 MCG/ML IJ SOSY: 12.5000 ug | PREFILLED_SYRINGE | INTRAMUSCULAR | Status: DC | PRN

## 2021-09-06 MED ORDER — POTASSIUM CHLORIDE CRYS ER 20 MEQ PO TBCR
40.0000 meq | EXTENDED_RELEASE_TABLET | Freq: Once | ORAL | Status: AC
  Administered 2021-09-06: 11:00:00 40 meq via ORAL
  Filled 2021-09-06: qty 2

## 2021-09-06 MED ORDER — MORPHINE SULFATE (PF) 4 MG/ML IV SOLN
4.0000 mg | Freq: Once | INTRAVENOUS | Status: AC
  Administered 2021-09-06: 09:00:00 4 mg via INTRAVENOUS
  Filled 2021-09-06: qty 1

## 2021-09-06 MED ORDER — LACTATED RINGERS IV BOLUS
1000.0000 mL | Freq: Once | INTRAVENOUS | Status: AC
  Administered 2021-09-06: 15:00:00 1000 mL via INTRAVENOUS

## 2021-09-06 NOTE — Progress Notes (Signed)
MD [place] notified of patient's potassium being 2.6

## 2021-09-06 NOTE — Subjective & Objective (Signed)
5 days of epigastric abdominal pain nausea and vomiting nonbloody nonbilious.  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  No fevers.  No diarrhea Denies EtOH has been trying to drink fluids but does not seem to help noticed some abdominal pain associated.  No fever no chills

## 2021-09-06 NOTE — Assessment & Plan Note (Signed)
-   will monitor on tele avoid QT prolonging medications, rehydrate correct electrolytes ? ?

## 2021-09-06 NOTE — Assessment & Plan Note (Signed)
-    evidence of acute renal failure due to presence of following: Cr increased >0.3 from baseline likely secondary to dehydration       check FeNA       Rehydrate with IV fluids  History does not suggest urinary retention or obstruction

## 2021-09-06 NOTE — Assessment & Plan Note (Signed)
° -   order urine electrolytes, Gently rehydrate with normal saline at  -Frequent labs Check TSH

## 2021-09-06 NOTE — Assessment & Plan Note (Signed)
No fever or chills rehydrate and repeat

## 2021-09-06 NOTE — ED Provider Notes (Signed)
[facility] Provider Note   CSN: [id number] Arrival date & time: 09/06/21  [id number]     History Chief Complaint  Patient presents with   Abdominal Pain   Nausea   Emesis    Carlos Guzman is a 35 y.o. male.  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  Complaining of 5 days of epigastric abdominal pain nausea and vomiting.  No blood.  No fevers.  No diarrhea.  Denies any recent alcohol use.  Has been trying to orally rehydrate without any improvement.  The history is provided by the patient.  Abdominal Pain Pain location:  Epigastric Pain quality: aching   Pain severity:  Moderate Onset quality:  Gradual Duration:  5 days Timing:  Constant Progression:  Unchanged Chronicity:  Recurrent Context: not alcohol use and not trauma   Relieved by:  Nothing Worsened by:  Nothing Ineffective treatments:  Liquids Associated symptoms: nausea and vomiting   Associated symptoms: no chest pain, no constipation, no cough, no diarrhea, no dysuria, no fever, no hematemesis, no hematochezia, no hematuria, no shortness of breath and no sore throat   Risk factors: has not had multiple surgeries   Emesis Severity:  Moderate Timing:  Intermittent Quality:  Stomach contents Progression:  Unchanged Chronicity:  Recurrent Recent urination:  Normal Relieved by:  Nothing Worsened by:  Liquids Ineffective treatments:  None tried Associated symptoms: abdominal pain   Associated symptoms: no cough, no diarrhea, no fever, no headaches, no myalgias and no sore throat       Past Medical History:  Diagnosis Date   Asthma    Cannabinoid hyperemesis syndrome    HIV (human immunodeficiency virus infection) ([place])    Pancreatitis    Substance abuse ([place])     Patient Active Problem List   Diagnosis Date Noted   ARF (acute renal failure) ([place]) 07/29/2019   Hyperkalemia 07/29/2019   Left upper quadrant abdominal pain [id number]   Metabolic  acidosis, increased anion gap 07/29/2019   Hyperbilirubinemia 07/29/2019   Hyponatremia 07/29/2019   Rhabdomyolysis 07/24/2019   AKI (acute kidney injury) ([place]) 07/23/2019   HIV (human immunodeficiency virus infection) ([place]) 07/23/2019   Cannabinoid hyperemesis syndrome 07/23/2019   Hypokalemia 07/23/2019   Leukocytosis 07/23/2019   Tobacco abuse 07/23/2019   Alcohol abuse, 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 Authorizing Provider  bictegravir-emtricitabine-tenofovir AF (BIKTARVY) 50-200-25 MG TABS tablet Take 1 tablet by mouth daily.  04/03/19   [provider]  dicyclomine (BENTYL) 20 MG tablet Take 1 tablet (20 mg total) by mouth 2 (two) times daily for 5 days. 03/26/21 03/31/21  Volanda Napoleon, PA-C  famotidine (PEPCID) 20 MG tablet Take 1 tablet (20 mg total) by mouth daily. 01/06/21   Horton, Kristie M, DO  ondansetron (ZOFRAN ODT) 4 MG  disintegrating tablet Take 1 tablet (4 mg total) by mouth every 8 (eight) hours as needed for nausea or vomiting. 07/16/21   Quintella Reichert, MD  ondansetron (ZOFRAN) 4 MG tablet Take 1 tablet (4 mg total) by mouth every 8 (eight) hours as needed for nausea or vomiting. 01/04/21   Tegeler, Gwenyth Allegra, MD  potassium chloride SA (KLOR-CON) 20 MEQ tablet Take 1 tablet (20 mEq total) by mouth daily. 07/16/21   Quintella Reichert, MD  metoCLOPramide (REGLAN) 10 MG tablet Take 1 tablet (10 mg total) by mouth every 6 (six) hours as needed for  nausea or vomiting. Patient not taking: No sig reported 06/10/19 11/17/20  Molpus, Jenny Reichmann, MD  sucralfate (CARAFATE) 1 g tablet Take 1 tablet (1 g total) by mouth 4 (four) times daily -  with meals and at bedtime. 06/10/19 06/10/19  Molpus, Jenny Reichmann, MD    Allergies    Patient has no known allergies.  Review of Systems   Review of Systems  Constitutional:  Negative for fever.  HENT:  Negative for sore throat.   Eyes:  Negative for visual disturbance.  Respiratory:  Negative for cough and shortness of breath.   Cardiovascular:  Negative for chest pain.  Gastrointestinal:  Positive for abdominal pain, nausea and vomiting. Negative for constipation, diarrhea, hematemesis and hematochezia.  Genitourinary:  Negative for dysuria and hematuria.  Musculoskeletal:  Negative for myalgias.  Skin:  Negative for rash.  Neurological:  Negative for headaches.   Physical Exam Updated Vital Signs BP (!) 161/106 (BP Location: Right Arm)    Pulse (!) 103    Temp 97.8 F (36.6 C)    Resp 19    Ht 6\' 1"  (1.854 m)    Wt 77.1 kg    SpO2 100%    BMI 22.43 kg/m   Physical Exam Vitals and nursing note reviewed.  Constitutional:      General: He is not in acute distress.    Appearance: He is well-developed.  HENT:     Head: Normocephalic and atraumatic.  Eyes:     Conjunctiva/sclera: Conjunctivae normal.  Cardiovascular:     Rate and Rhythm: Regular rhythm. Tachycardia present.     Heart sounds: No murmur heard. Pulmonary:     Effort: Pulmonary effort is normal. No respiratory distress.     Breath sounds: Normal breath sounds.  Abdominal:     Palpations: Abdomen is soft.     Tenderness: There is abdominal tenderness in the epigastric area. There is no guarding or rebound.  Musculoskeletal:        General: No swelling, deformity or signs of injury.     Cervical back: Neck supple.  Skin:    General: Skin is warm and dry.     Capillary Refill: Capillary refill takes less than 2 seconds.  Neurological:      General: No focal deficit present.     Mental Status: He is alert.     Gait: Gait normal.  Psychiatric:        Mood and Affect: Mood normal.    ED Results / Procedures / Treatments   Labs (all labs ordered are listed, but only abnormal results are displayed) Labs Reviewed  COMPREHENSIVE METABOLIC PANEL - Abnormal; Notable for the following components:      Result Value   Sodium 121 (*)    Potassium 2.8 (*)    Chloride 78 (*)    Glucose, Bld 148 (*)    BUN 76 (*)    Creatinine, Ser  2.84 (*)    Total Protein 9.2 (*)    Albumin 5.2 (*)    Total Bilirubin 2.0 (*)    GFR, Estimated 28 (*)    Anion gap 18 (*)    All other components within normal limits  CBC - Abnormal; Notable for the following components:   WBC 17.8 (*)    RBC 6.23 (*)    Hemoglobin 19.1 (*)    MCHC 37.1 (*)    All other components within normal limits  URINALYSIS, ROUTINE W REFLEX MICROSCOPIC - Abnormal; Notable for the following components:   Hgb urine dipstick SMALL (*)    Bilirubin Urine SMALL (*)    Protein, ur 100 (*)    All other components within normal limits  URINALYSIS, MICROSCOPIC (REFLEX) - Abnormal; Notable for the following components:   Bacteria, UA RARE (*)    All other components within normal limits  BASIC METABOLIC PANEL - Abnormal; Notable for the following components:   Sodium 122 (*)    Potassium 3.2 (*)    Chloride 82 (*)    Glucose, Bld 107 (*)    BUN 65 (*)    Creatinine, Ser 2.70 (*)    GFR, Estimated 30 (*)    All other components within normal limits  RESP PANEL BY RT-PCR (FLU A&B, COVID) ARPGX2  LIPASE, BLOOD    EKG None  Radiology CT Abdomen Pelvis Wo Contrast  Result Date: 09/06/2021 CLINICAL DATA:  Vomiting and abdominal pain. EXAM: CT ABDOMEN AND PELVIS WITHOUT CONTRAST TECHNIQUE: Multidetector CT imaging of the abdomen and pelvis was performed following the standard protocol without IV contrast. COMPARISON:  CT of the abdomen and pelvis with contrast on  07/16/2021 FINDINGS: Lower chest: No acute abnormality. Hepatobiliary: No focal liver abnormality is seen. No gallstones, gallbladder wall thickening, or biliary dilatation. Pancreas: Unremarkable. No pancreatic ductal dilatation or surrounding inflammatory changes. Spleen: Normal in size without focal abnormality. Adrenals/Urinary Tract: Adrenal glands are unremarkable. Kidneys demonstrate no evidence of hydronephrosis or calculi. The bladder is decompressed. Stomach/Bowel: Bowel shows no evidence of obstruction, ileus or inflammation. There is some calcific material in pelvic small bowel on images 63-65 within the left pelvis which was not present on the prior study and therefore is likely representative of some type of ingested material. No evidence of appendicitis. No free intraperitoneal air. Vascular/Lymphatic: No significant vascular findings are present. No enlarged abdominal or pelvic lymph nodes. Reproductive: Prostate is unremarkable. Other: No abdominal wall hernia or abnormality. No focal abscess or ascites. Musculoskeletal: No acute or significant osseous findings. IMPRESSION: No acute findings in the abdomen or pelvis. Calcific material in pelvic small bowel appears to represent ingested material as no calcification was present in this region on the November CT. Electronically Signed   By: Irish Lack M.D.   On: 09/06/2021 13:03    Procedures Procedures   Medications Ordered in ED Medications  sodium chloride 0.9 % bolus 1,000 mL (has no administration in time range)  famotidine (PEPCID) IVPB 20 mg premix (has no administration in time range)  ondansetron (ZOFRAN) injection 4 mg (has no administration in time range)  fentaNYL (SUBLIMAZE) injection 50 mcg (has no administration in time range)    ED Course  I have reviewed the triage vital signs and the nursing notes.  Pertinent labs & imaging results that were available during my care of the patient were reviewed by me and  considered in my medical decision making (see chart for details).    MDM Rules/Calculators/A&P  This patient complains of nausea vomiting abdominal pain; this involves an extensive number of treatment Options and is a complaint that carries with it a high risk of complications and Morbidity. The differential includes cannabis hyperemesis, peptic ulcer disease, gastroparesis, obstruction, metabolic derangement  I ordered, reviewed and interpreted labs, which included CBC with elevated white count, hemoglobin elevated reflecting some dehydration chemistries with markedly low sodium and potassium, elevated BUN and creatinine, urinalysis without clear signs of infection, COVID and flu negative I ordered medication IV fluids pain medication nausea medication, oral potassium supplementation, IV Pepcid I ordered imaging studies which included CT abdomen and pelvis and I independently    visualized and interpreted imaging which showed no acute findings to explain patient's symptoms  Previous records obtained and reviewed in epic, has multiple ED visits for similar presentations, also has had significant metabolic derangements in past.  After the interventions stated above, I reevaluated the patient and found patient to be tolerating p.o. and improved symptomatically.  Due to his significant metabolic derangements recommended he be admitted to the hospital for further management.  His care was signed out to Dr. Almyra Free ED physician to review with hospitalist regarding admission.     Final Clinical Impression(s) / ED Diagnoses Final diagnoses:  Nausea and vomiting, unspecified vomiting type  Dehydration  Hyponatremia  Hypokalemia    Rx / DC Orders ED Discharge Orders     None        Hayden Rasmussen, MD 09/06/21 1753

## 2021-09-06 NOTE — Assessment & Plan Note (Signed)
Supportive management Encourage stopping marijuana abuse CT scan abdomen/ pelvis unremarkable Except for artifact is reviewed with radiology this is more consistent with nondistended small bowel rather than contrast

## 2021-09-06 NOTE — ED Triage Notes (Signed)
Pt c/o abdominal pain, nausea, and vomiting since Saturday. Pt states it feels like pancreatitis, hx of.

## 2021-09-06 NOTE — Assessment & Plan Note (Signed)
Counseled regarding cessation

## 2021-09-06 NOTE — H&P (Signed)
Alvarez Paccione [id number] DOB: 09/11/1985 DOA: 09/06/2021     PCP: Patient, No Pcp Per (Inactive)   Outpatient Specialists:  NONE Sees HIV doctor at [facility]  Patient arrived to ER on 09/06/21 at 0817 Referred by Attending Hosie Poisson, MD   Patient coming from: home Lives With family    Chief Complaint:   Chief Complaint  Patient presents with   Abdominal Pain   Nausea   Emesis    HPI: Nichols Demarest is a 35 y.o. male with medical history significant of asthma, cannabinoid hyperemesis syndrome, HIV, pancreatitis, polysubstance abuse, tobacco abuse    Presented with nausea vomiting 5 days of epigastric abdominal pain nausea and vomiting nonbloody nonbilious.  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  No fevers.  No diarrhea Denies EtOH has been trying to drink fluids but does not seem to help noticed some abdominal pain associated.  No fever no chills   No ETOH  Smokes interested In quitting Last Marijuana No diarrhea Had some [place] sandwich  Has been vaccinated against COVID had no flu shot   Initial COVID TEST  NEGATIVE  Lab Results  Component Value Date   SARSCOV2NAA NEGATIVE 09/06/2021   [place] NEGATIVE 01/13/2020   [place] NEGATIVE 07/29/2019   [place] NEGATIVE 07/23/2019     Regarding pertinent Chronic problems:    HIV on Biktarvy  No results found for: CD4TABS Lab Results  Component Value Date   HIV1RNAQUANT <20 07/24/2019      Asthma -well   controlled on home inhalers/    CKD stage IIIb- baseline Cr 1.6 Estimated Creatinine Clearance: 40.9 mL/min (A) (by C-G formula based on SCr of 2.7 mg/dL (H)).  Lab Results  Component Value Date   CREATININE 2.70 (H) 09/06/2021   CREATININE 2.84 (H) 09/06/2021   CREATININE 1.55 (H) 07/15/2021   While in ER:   Patient was administered IV fluids and potassium supplementation   CTabd/pelvis - No acute findings in the abdomen or pelvis. Calcific  material in pelvic small bowel appears to represent ingested material as no calcification was present in this region on the November CT.    Following Medications were ordered in ER: Medications  lactated ringers infusion ( Intravenous New Bag/Given 09/06/21 1236)  sodium chloride 0.9 % bolus 1,000 mL (0 mLs Intravenous Stopped 09/06/21 1032)  famotidine (PEPCID) IVPB 20 mg premix (0 mg Intravenous Stopped 09/06/21 0940)  ondansetron (ZOFRAN) injection 4 mg (4 mg Intravenous Given 09/06/21 0901)  fentaNYL (SUBLIMAZE) injection 50 mcg (50 mcg Intravenous Given 09/06/21 0902)  morphine 4 MG/ML injection 4 mg (4 mg Intravenous Given 09/06/21 0926)  potassium chloride SA (KLOR-CON M) CR tablet 40 mEq (40 mEq Oral Given 09/06/21 1048)  lactated ringers bolus 1,000 mL (0 mLs Intravenous Stopped 09/06/21 1234)  morphine 4 MG/ML injection 4 mg (4 mg Intravenous Given 09/06/21 1136)  morphine 4 MG/ML injection 4 mg (4 mg Intravenous Given 09/06/21 1252)  lactated ringers bolus 1,000 mL (0 mLs Intravenous Stopped 09/06/21 1646)  potassium chloride SA (KLOR-CON M) CR tablet 20 mEq (20 mEq Oral Given 09/06/21 1440)       ED Triage Vitals  Enc Vitals Group     BP 09/06/21 0833 (!) 161/106     Pulse Rate 09/06/21 0833 (!) 103     Resp 09/06/21 0833 19     Temp 09/06/21 0833 97.8 F (36.6 C)     Temp Source 09/06/21 1801 Oral  SpO2 09/06/21 0833 100 %     Weight 09/06/21 0834 170 lb (77.1 kg)     Height 09/06/21 0834 6\' 1"  (1.854 m)     Head Circumference --      Peak Flow --      Pain Score 09/06/21 0833 7     Pain Loc --      Pain Edu? --      Excl. in [facility]? --   TMAX(24)@     _________________________________________ Significant initial  Findings: Abnormal Labs Reviewed  COMPREHENSIVE METABOLIC PANEL - Abnormal; Notable for the following components:      Result Value   Sodium 121 (*)    Potassium 2.8 (*)    Chloride 78 (*)    Glucose, Bld 148 (*)    BUN 76 (*)    Creatinine, Ser  2.84 (*)    Total Protein 9.2 (*)    Albumin 5.2 (*)    Total Bilirubin 2.0 (*)    GFR, Estimated 28 (*)    Anion gap 18 (*)    All other components within normal limits  CBC - Abnormal; Notable for the following components:   WBC 17.8 (*)    RBC 6.23 (*)    Hemoglobin 19.1 (*)    MCHC 37.1 (*)    All other components within normal limits  URINALYSIS, ROUTINE W REFLEX MICROSCOPIC - Abnormal; Notable for the following components:   Hgb urine dipstick SMALL (*)    Bilirubin Urine SMALL (*)    Protein, ur 100 (*)    All other components within normal limits  URINALYSIS, MICROSCOPIC (REFLEX) - Abnormal; Notable for the following components:   Bacteria, UA RARE (*)    All other components within normal limits  BASIC METABOLIC PANEL - Abnormal; Notable for the following components:   Sodium 122 (*)    Potassium 3.2 (*)    Chloride 82 (*)    Glucose, Bld 107 (*)    BUN 65 (*)    Creatinine, Ser 2.70 (*)    GFR, Estimated 30 (*)    All other components within normal limits  BASIC METABOLIC PANEL - Abnormal; Notable for the following components:   Sodium 127 (*)    Potassium 2.6 (*)    Chloride 89 (*)    Glucose, Bld 120 (*)    BUN 54 (*)    Creatinine, Ser 2.07 (*)    Calcium 8.7 (*)    GFR, Estimated 42 (*)    All other components within normal limits  MAGNESIUM - Abnormal; Notable for the following components:   Magnesium 2.6 (*)    All other components within normal limits  RAPID URINE DRUG SCREEN, HOSP PERFORMED - Abnormal; Notable for the following components:   Opiates POSITIVE (*)    Cocaine POSITIVE (*)    Tetrahydrocannabinol POSITIVE (*)    All other components within normal limits       ECG: Ordered Personally reviewed by me showing: HR : 80 Rhythm: NSR,    , no evidence of ischemic changes QTC 562  The recent clinical data is shown below. Vitals:   09/06/21 1450 09/06/21 1530 09/06/21 1700 09/06/21 1801  BP: (!) 156/112 (!) 149/92 (!) 149/94 (!)  149/100  Pulse: 91  84 87  Resp: 14 18  18   Temp:    98.4 F (36.9 C)  TempSrc:    Oral  SpO2: 100% 100% 100% 100%  Weight:      Height:  WBC     Component Value Date/Time   WBC 17.8 (H) 09/06/2021 0840   LYMPHSABS 3.5 01/06/2021 0657   LYMPHSABS 6.4 (H) 11/24/2013 1333   MONOABS 1.5 (H) 01/06/2021 0657   MONOABS 1.3 (H) 11/24/2013 1333   EOSABS 0.1 01/06/2021 0657   EOSABS 0.0 11/24/2013 1333   BASOSABS 0.1 01/06/2021 0657   BASOSABS 0.1 11/24/2013 1333     UA   no evidence of UTI      Urine analysis:    Component Value Date/Time   COLORURINE YELLOW 09/06/2021 [address] 09/06/2021 0907   APPEARANCEUR Clear 11/24/2013 1333   LABSPEC >=1.030 09/06/2021 0907   LABSPEC 1.030 11/24/2013 1333   PHURINE 5.5 09/06/2021 0907   GLUCOSEU NEGATIVE 09/06/2021 0907   GLUCOSEU Negative 11/24/2013 1333   HGBUR SMALL (A) 09/06/2021 0907   BILIRUBINUR SMALL (A) 09/06/2021 0907   BILIRUBINUR 1+ 11/24/2013 1333   KETONESUR NEGATIVE 09/06/2021 0907   PROTEINUR 100 (A) 09/06/2021 0907   UROBILINOGEN 1.0 01/04/2015 1651   NITRITE NEGATIVE 09/06/2021 0907   LEUKOCYTESUR NEGATIVE 09/06/2021 0907   LEUKOCYTESUR Negative 11/24/2013 1333    Results for orders placed or performed during the hospital encounter of 09/06/21  Resp Panel by RT-PCR (Flu A&B, Covid) Nasopharyngeal Swab     Status: None   Collection Time: 09/06/21  3:27 PM   Specimen: Nasopharyngeal Swab; Nasopharyngeal(NP) swabs in vial transport medium  Result Value Ref Range Status   SARS Coronavirus 2 by RT PCR NEGATIVE NEGATIVE Final         Influenza A by PCR NEGATIVE NEGATIVE Final   Influenza B by PCR NEGATIVE NEGATIVE Final          _______________________________________________ Hospitalist was called for admission for hypokalemia and AKI  The following Work up has been ordered so far:  Orders Placed This Encounter  Procedures   Resp Panel by RT-PCR (Flu A&B, Covid) Nasopharyngeal Swab    CT Abdomen Pelvis Wo Contrast   Lipase, blood   Comprehensive metabolic panel   CBC   Urinalysis, Routine w reflex microscopic   Urinalysis, Microscopic (reflex)   Basic metabolic panel   Diet regular Room service appropriate? Yes; Fluid consistency: Thin   Fluid/PO Challenge   Consult to hospitalist  Vomiting, AKI   Place in observation (patient's expected length of stay will be less than 2 midnights)     OTHER Significant initial  Findings:  labs showing:    Recent Labs  Lab 09/06/21 0840 09/06/21 1357 09/06/21 2136  NA 121* 122* 127*  K 2.8* 3.2* 2.6*  CO2 25 28 28   GLUCOSE 148* 107* 120*  BUN 76* 65* 54*  CREATININE 2.84* 2.70* 2.07*  CALCIUM 9.7 8.9 8.7*  MG  --   --  2.6*  PHOS  --   --  3.1    Cr    Up from baseline see below Lab Results  Component Value Date   CREATININE 2.70 (H) 09/06/2021   CREATININE 2.84 (H) 09/06/2021   CREATININE 1.55 (H) 07/15/2021    Recent Labs  Lab 09/06/21 0840  AST 24  ALT 21  ALKPHOS 56  BILITOT 2.0*  PROT 9.2*  ALBUMIN 5.2*   Lab Results  Component Value Date   CALCIUM 8.9 09/06/2021   PHOS 2.7 07/24/2019          Plt: Lab Results  Component Value Date   PLT 373 09/06/2021         Recent Labs  Lab 09/06/21 0840  WBC 17.8*  HGB 19.1*  HCT 51.5  MCV 82.7  PLT 373    HG/HCT   stable,       Component Value Date/Time   HGB 19.1 (H) 09/06/2021 0840   HGB 16.9 11/24/2013 1333   HCT 51.5 09/06/2021 0840   HCT 51.1 11/24/2013 1333   MCV 82.7 09/06/2021 0840   MCV 90 11/24/2013 1333      Recent Labs  Lab 09/06/21 0840  LIPASE 43      Cardiac Panel (last 3 results) Recent Labs    09/06/21 2136  CKTOTAL 340    Cultures: No results found for: SDES, SPECREQUEST, CULT, REPTSTATUS   Radiological Exams on Admission: CT Abdomen Pelvis Wo Contrast  Result Date: 09/06/2021 CLINICAL DATA:  Vomiting and abdominal pain. EXAM: CT ABDOMEN AND PELVIS WITHOUT CONTRAST TECHNIQUE: Multidetector CT  imaging of the abdomen and pelvis was performed following the standard protocol without IV contrast. COMPARISON:  CT of the abdomen and pelvis with contrast on 07/16/2021 FINDINGS: Lower chest: No acute abnormality. Hepatobiliary: No focal liver abnormality is seen. No gallstones, gallbladder wall thickening, or biliary dilatation. Pancreas: Unremarkable. No pancreatic ductal dilatation or surrounding inflammatory changes. Spleen: Normal in size without focal abnormality. Adrenals/Urinary Tract: Adrenal glands are unremarkable. Kidneys demonstrate no evidence of hydronephrosis or calculi. The bladder is decompressed. Stomach/Bowel: Bowel shows no evidence of obstruction, ileus or inflammation. There is some calcific material in pelvic small bowel on images 63-65 within the left pelvis which was not present on the prior study and therefore is likely representative of some type of ingested material. No evidence of appendicitis. No free intraperitoneal air. Vascular/Lymphatic: No significant vascular findings are present. No enlarged abdominal or pelvic lymph nodes. Reproductive: Prostate is unremarkable. Other: No abdominal wall hernia or abnormality. No focal abscess or ascites. Musculoskeletal: No acute or significant osseous findings. IMPRESSION: No acute findings in the abdomen or pelvis. Calcific material in pelvic small bowel appears to represent ingested material as no calcification was present in this region on the November CT. Electronically Signed   By: Aletta Edouard M.D.   On: 09/06/2021 13:03   _______________________________________________________________________________________________________ Latest   Blood pressure (!) 149/100, pulse 87, temperature 98.4 F (36.9 C), temperature source Oral, resp. rate 18, height 6\' 1"  (1.854 m), weight 77.1 kg, SpO2 100 %.   Vitals  labs and radiology finding personally reviewed  Review of Systems:    Pertinent positives include:  fatigue,   abdominal  pain, nausea, vomiting,  Constitutional:  No weight loss, night sweats, Fevers, chills, weight loss  HEENT:  No headaches, Difficulty swallowing,Tooth/dental problems,Sore throat,  No sneezing, itching, ear ache, nasal congestion, post nasal drip,  Cardio-vascular:  No chest pain, Orthopnea, PND, anasarca, dizziness, palpitations.no Bilateral lower extremity swelling  GI:  No heartburn, indigestion, diarrhea, change in bowel habits, loss of appetite, melena, blood in stool, hematemesis Resp:  no shortness of breath at rest. No dyspnea on exertion, No excess mucus, no productive cough, No non-productive cough, No coughing up of blood.No change in color of mucus.No wheezing. Skin:  no rash or lesions. No jaundice GU:  no dysuria, change in color of urine, no urgency or frequency. No straining to urinate.  No flank pain.  Musculoskeletal:  No joint pain or no joint swelling. No decreased range of motion. No back pain.  Psych:  No change in mood or affect. No depression or anxiety. No memory loss.  Neuro: no localizing neurological complaints, no  tingling, no weakness, no double vision, no gait abnormality, no slurred speech, no confusion  All systems reviewed and apart from HOPI all are negative _______________________________________________________________________________________________ Past Medical History:   Past Medical History:  Diagnosis Date   Asthma    Cannabinoid hyperemesis syndrome    HIV (human immunodeficiency virus infection) (HCC)    Pancreatitis    Substance abuse (HCC)      Past Surgical History:  Procedure Laterality Date   NO PAST SURGERIES      Social History:  Ambulatory   independently     reports that he has been smoking cigarettes. He has been smoking an average of 1 pack per day. He has never used smokeless tobacco. He reports that he does not currently use alcohol. He reports current drug use. Drug: Marijuana.     Family History:   Family  History  Problem Relation Age of Onset   CAD Neg Hx    Diabetes Neg Hx    ______________________________________________________________________________________________ Allergies: No Known Allergies   Prior to Admission medications   Medication Sig Start Date End Date Taking? Authorizing Provider  bictegravir-emtricitabine-tenofovir AF (BIKTARVY) 50-200-25 MG TABS tablet Take 1 tablet by mouth daily.  04/03/19   [provider]  dicyclomine (BENTYL) 20 MG tablet Take 1 tablet (20 mg total) by mouth 2 (two) times daily for 5 days. 03/26/21 03/31/21  Maxwell Caul, PA-C  famotidine (PEPCID) 20 MG tablet Take 1 tablet (20 mg total) by mouth daily. 01/06/21   Horton, Kristie M, DO  ondansetron (ZOFRAN ODT) 4 MG disintegrating tablet Take 1 tablet (4 mg total) by mouth every 8 (eight) hours as needed for nausea or vomiting. 07/16/21   Tilden Fossa, MD  ondansetron (ZOFRAN) 4 MG tablet Take 1 tablet (4 mg total) by mouth every 8 (eight) hours as needed for nausea or vomiting. 09/06/21   Terrilee Files, MD  potassium chloride SA (KLOR-CON) 20 MEQ tablet Take 1 tablet (20 mEq total) by mouth daily. 07/16/21   Tilden Fossa, MD  metoCLOPramide (REGLAN) 10 MG tablet Take 1 tablet (10 mg total) by mouth every 6 (six) hours as needed for nausea or vomiting. Patient not taking: No sig reported 06/10/19 11/17/20  Molpus, Jonny Ruiz, MD  sucralfate (CARAFATE) 1 g tablet Take 1 tablet (1 g total) by mouth 4 (four) times daily -  with meals and at bedtime. 06/10/19 06/10/19  Molpus, John, MD    ___________________________________________________________________________________________________ Physical Exam: Vitals with BMI 09/06/2021 09/06/2021 09/06/2021  Height - - -  Weight - - -  BMI - - -  Systolic 149 149 275  Diastolic 100 94 92  Pulse 87 84 -     1. General:  in No  Acute distress    ill -appearing 2. Psychological: Alert and   Oriented 3. Head/ENT:    Dry Mucous Membranes                           Head Non traumatic, neck supple                          Poor Dentition 4. SKIN:  decreased Skin turgor,  Skin clean Dry and intact no rash 5. Heart: Regular rate and rhythm no  Murmur, no Rub or gallop 6. Lungs: , no wheezes or crackles   7. Abdomen: Soft,  non-tender, Non distended   obese  bowel sounds present 8. Lower extremities:  no clubbing, cyanosis, no  edema 9. Neurologically Grossly intact, moving all 4 extremities equally  10. MSK: Normal range of motion    Chart has been reviewed  ______________________________________________________________________________________________  Assessment/Plan 35 y.o. male with medical history significant of asthma, cannabinoid hyperemesis syndrome, HIV, pancreatitis, polysubstance abuse, tobacco abuse  Admitted for hypokalemia, dehydration qt prolongation  Present on Admission:  Intractable nausea and vomiting  AKI (acute kidney injury) ([place])  HIV (human immunodeficiency virus infection) ([place])  Cannabinoid hyperemesis syndrome  Hypokalemia  Tobacco abuse  Leukocytosis  Hyponatremia  CKD (chronic kidney disease), stage III (HCC)  Prolonged QT interval  Cocaine abuse ([place])     AKI (acute kidney injury) ([place]) -  evidence of acute renal failure due to presence of following: Cr increased >0.3 from baseline likely secondary to dehydration       check FeNA       Rehydrate with IV fluids  History does not suggest urinary retention or obstruction         HIV (human immunodeficiency virus infection) ([place]) Continue home medications obtain CD4 count and HIV viral load  Hypokalemia - will replace and repeat in AM,  check magnesium level and replace as needed   Tobacco abuse  - Spoke about importance of quitting spent 5 minutes discussing options for treatment, prior attempts at quitting, and dangers of smoking  -At this point patient is interested in quitting  - refuses nicotine patch   - nursing tobacco cessation  protocol   Leukocytosis No fever or chills rehydrate and repeat  Hyponatremia   - order urine electrolytes, Gently rehydrate with normal saline at 158ml  -Frequent labs Check TSH   CKD (chronic kidney disease), stage III (HCC)  -chronic avoid nephrotoxic medications such as NSAIDs, Vanco Zosyn combo,  avoid hypotension, continue to follow renal function   Cannabinoid hyperemesis syndrome Supportive management Encourage stopping marijuana abuse CT scan abdomen/ pelvis unremarkable Except for artifact is reviewed with radiology this is more consistent with nondistended small bowel rather than contrast  Intractable nausea and vomiting Most likely secondary to cannabinoid hyperemesis syndrome.  Supportive management  Prolonged QT interval - will monitor on tele avoid QT prolonging medications, rehydrate correct electrolytes   Cocaine abuse ([place]) Counseled regarding cessation   Other plan as per orders.  DVT prophylaxis:  SCD     Code Status:    Code Status: Prior FULL CODE as per patient  I had personally discussed CODE STATUS with patient   Family Communication:   Family not at  Bedside    Disposition Plan:       To home once workup is complete and patient is stable   Following barriers for discharge:                            Electrolytes corrected                                                            Pain controlled with PO medications  Will need to be able to tolerate PO                                                      Will need consultants to evaluate patient prior to discharge  Transition of care consulted                                    Consults called: none  Admission status:  ED Disposition     ED Disposition  Admit   Condition  --   [place]: [facility] [[id number]]  Level of Care: Med-Surg [16]  Interfacility transfer: Yes  May place  patient in observation at [facility] or [place] if equivalent level of care is available:: Yes  Covid Evaluation: Asymptomatic Screening Protocol (No Symptoms)  Diagnosis: Nausea & vomiting [id number]  Admitting Physician: Hosie Poisson [[id number]]  Attending Physician: Hosie Poisson [[id number]]          Obs      Level of care    medsurge  For 12H  Lab Results  Component Value Date   [place] 09/06/2021     Precautions: admitted as Covid Negative    Caron Tardif 09/07/2021, 1:55 AM    [facility]     after 2 AM please page floor coverage PA If 7AM-7PM, please contact the day team taking care of the patient using [email or web address]   Patient was evaluated in the context of the global COVID-19 pandemic, which necessitated consideration that the patient might be at risk for infection with the SARS-CoV-2 virus that causes COVID-19. Institutional protocols and algorithms that pertain to the evaluation of patients at risk for COVID-19 are in a state of rapid change based on information released by regulatory bodies including the CDC and federal and state organizations. These policies and algorithms were followed during the patient's care.

## 2021-09-06 NOTE — Assessment & Plan Note (Signed)
-   will replace and repeat in AM,  check magnesium level and replace as needed ° °

## 2021-09-06 NOTE — ED Notes (Signed)
Water provided for PO challenge

## 2021-09-06 NOTE — Assessment & Plan Note (Signed)
-   Spoke about importance of quitting spent 5 minutes discussing options for treatment, prior attempts at quitting, and dangers of smoking  -At this point patient is interested in quitting  - refuses nicotine patch   - nursing tobacco cessation protocol

## 2021-09-06 NOTE — Assessment & Plan Note (Signed)
Most likely secondary to cannabinoid hyperemesis syndrome.  Supportive management

## 2021-09-06 NOTE — Progress Notes (Signed)
35 year old gentleman with prior h/o polysubstance abuse and HIV, comes in for persistent nausea, vomiting and abdominal pain. Labs were significant for hyponatremia, hypokalemia and AKI.  CT abd and pelvis with unremarkable.  Requesting admission for observation for med surg for IV hydration, IV anti emetics.    Kathlen Mody, MD

## 2021-09-06 NOTE — Assessment & Plan Note (Signed)
-  chronic avoid nephrotoxic medications such as NSAIDs, Vanco Zosyn combo,  avoid hypotension, continue to follow renal function  

## 2021-09-06 NOTE — Assessment & Plan Note (Signed)
Continue home medications obtain CD4 count and HIV viral load

## 2021-09-06 NOTE — ED Provider Notes (Signed)
Case discussed with hospitalist group will admit the patient for severe electrolyte abnormalities.   Cheryll Cockayne, MD 09/06/21 [phone]

## 2021-09-07 DIAGNOSIS — R112 Nausea with vomiting, unspecified: Secondary | ICD-10-CM | POA: Diagnosis not present

## 2021-09-07 LAB — OSMOLALITY, URINE: Osmolality, Ur: 561 mOsm/kg (ref 300–900)

## 2021-09-07 LAB — COMPREHENSIVE METABOLIC PANEL
ALT: 15 U/L (ref 0–44)
AST: 18 U/L (ref 15–41)
Albumin: 3.6 g/dL (ref 3.5–5.0)
Alkaline Phosphatase: 38 U/L (ref 38–126)
Anion gap: 10 (ref 5–15)
BUN: 48 mg/dL — ABNORMAL HIGH (ref 6–20)
CO2: 29 mmol/L (ref 22–32)
Calcium: 8.6 mg/dL — ABNORMAL LOW (ref 8.9–10.3)
Chloride: 91 mmol/L — ABNORMAL LOW (ref 98–111)
Creatinine, Ser: 1.77 mg/dL — ABNORMAL HIGH (ref 0.61–1.24)
GFR, Estimated: 50 mL/min — ABNORMAL LOW (ref >60)
Glucose, Bld: 106 mg/dL — ABNORMAL HIGH (ref 70–99)
Potassium: 3.4 mmol/L — ABNORMAL LOW (ref 3.5–5.1)
Sodium: 130 mmol/L — ABNORMAL LOW (ref 135–145)
Total Bilirubin: 1 mg/dL (ref 0.3–1.2)
Total Protein: 6.4 g/dL — ABNORMAL LOW (ref 6.5–8.1)

## 2021-09-07 LAB — CBC WITH DIFFERENTIAL/PLATELET
Abs Immature Granulocytes: 0.05 10*3/uL (ref 0.00–0.07)
Basophils Absolute: 0.1 10*3/uL (ref 0.0–0.1)
Basophils Relative: 0 %
Eosinophils Absolute: 0.1 10*3/uL (ref 0.0–0.5)
Eosinophils Relative: 0 %
HCT: 39.3 % (ref 39.0–52.0)
Hemoglobin: 14.2 g/dL (ref 13.0–17.0)
Immature Granulocytes: 0 %
Lymphocytes Relative: 23 %
Lymphs Abs: 3.1 10*3/uL (ref 0.7–4.0)
MCH: 30.7 pg (ref 26.0–34.0)
MCHC: 36.1 g/dL — ABNORMAL HIGH (ref 30.0–36.0)
MCV: 84.9 fL (ref 80.0–100.0)
Monocytes Absolute: 1.3 10*3/uL — ABNORMAL HIGH (ref 0.1–1.0)
Monocytes Relative: 10 %
Neutro Abs: 8.8 10*3/uL — ABNORMAL HIGH (ref 1.7–7.7)
Neutrophils Relative %: 67 %
Platelets: 288 10*3/uL (ref 150–400)
RBC: 4.63 MIL/uL (ref 4.22–5.81)
RDW: 13.3 % (ref 11.5–15.5)
WBC: 13.3 10*3/uL — ABNORMAL HIGH (ref 4.0–10.5)
nRBC: 0 % (ref 0.0–0.2)

## 2021-09-07 LAB — CORTISOL-AM, BLOOD: Cortisol - AM: 8.4 ug/dL (ref 6.7–22.6)

## 2021-09-07 LAB — PHOSPHORUS: Phosphorus: 2.9 mg/dL (ref 2.5–4.6)

## 2021-09-07 LAB — T-HELPER CELLS (CD4) COUNT (NOT AT ARMC)
CD4 % Helper T Cell: 44 % (ref 33–65)
CD4 T Cell Abs: 1034 /uL (ref 400–1790)

## 2021-09-07 LAB — MAGNESIUM: Magnesium: 2.6 mg/dL — ABNORMAL HIGH (ref 1.7–2.4)

## 2021-09-07 LAB — OSMOLALITY: Osmolality: 285 mOsm/kg (ref 275–295)

## 2021-09-07 LAB — TSH: TSH: 0.746 u[IU]/mL (ref 0.350–4.500)

## 2021-09-07 MED ORDER — POTASSIUM CHLORIDE CRYS ER 20 MEQ PO TBCR: 20.0000 meq | EXTENDED_RELEASE_TABLET | Freq: Every day | ORAL | 0 refills | Status: DC

## 2021-09-07 MED ORDER — FAMOTIDINE 20 MG PO TABS: 20.0000 mg | ORAL_TABLET | Freq: Every day | ORAL | 0 refills | Status: DC

## 2021-09-07 NOTE — Progress Notes (Signed)
Patient transferred to 6E, Room#1611.  Report given to stephanie RN. Pt had order for 4 runs of KCL, 3rd runs is infusing now and one more run to go. RN notified.

## 2021-09-07 NOTE — Progress Notes (Signed)
Pt discharged home in stable condition. Discharge instructions given. Script sent to pharmacy of choice. No immediate questions or concerns at this time. Pt opted to ambulate off unit.  

## 2021-09-07 NOTE — Discharge Instructions (Signed)
Outpatient Substance Use Treatment Services   [facility] [address] [place], [place] [address]  [phone] Private insurance, Medicare A&B, and GCCN   [facility]  [address]  [place], [place] [address] [phone] Medicaid, Self Pay   [facility]      [address]  [place], [place] [phone] Medicaid and Private Insurance, Self Pay   [facility] [address]  [place], [place]  [phone] Private Insurance, and Self Pay  [facility]      [address]    [place], [place] [address]  [phone] or [phone] Private Insurance Only   [facility] [address]  [place], [place] [address] [phone] Medicaid, Medicare, Private Insurance  [facility] [address]  [place], [place] [address] [phone] Services are free or reduced  [facility]  [address] [phone]  Self Pay only, sliding scale  [facility]  [address]  [place], [place] [address] [phone] ([facility]) Self Pay, Medicaid Only   [facility] [address]  [place], [place] [address] [phone] Medicaid, Medicare, Private Insurance  Residential Substance Use Treatment Services   [facility]  [address]  [place], [place] [address]  [phone] or [phone] Detox (Medicare, Medicaid, private insurance, and self pay)  Residential Rehab 14 days (Medicare, Medicaid, private insurance, and self pay)   [facility]  [address] [place], [place]  [phone]  Male and Male Detox (Self Pay and Medicaid limited availability)  Rehab only Male (Medicaid and self pay only)   [facility]      [address]   [place], [place] [address]  [phone] or [phone] Detox and Residential Treatment Private Insurance Only   [facility]  [address]  [place], [place] [address]  [phone]  Treatment Only, must make assessment appointment, and must be sober for assessment appointment.  Self Pay Only, Medicare A&B, [place] Medicaid, [place] ID only! *Transportation assistance offered from [employer] on [address]  [facility]     [address] [place], [place] [address] Walk in interviews M-Sat 8-4p No pending legal charges [phone]  [facility]:  [facility]  [address] [place], [place] [phone] (Self Pay, Medicaid)  [facility] [address] [place], [place] [address] [phone] Detox and Residential Treatment Medicare and Private Insurance  [facility] [address]  [place], [place] [address] 28 Day Women's Facility: [phone] 28 Day Men's Facility: [phone] Long-term Residential Program:  [phone] Males 25 and Over (No Insurance, upfront fee)  [facility]  [address] [place], [place] [address] [phone] Private Insurance with [employer], Private Pay  [facility] [address] [place], [place] [address] Local [phone] Private Insurance Only  [facility] [address]  [place], [place] [address]  [phone] (Males, upfront fee)  [facility] [place] [address]  [place], [address] [phone] Private Insurance   [facility] Locations  [facility]  [address]  [place], [place]  [phone] Christian Based Program for individuals experiencing homelessness Self Pay, No insurance  [facility]: [facility] [address]  [place],  [address] [phone]  [facility]: [place] [facility] [address]   [place], [place] [address] [phone] Christian Based Program for individuals  experiencing homelessness Self Pay, No insurance  [facility] [address]  [place], [place] [address]  [phone] Christian Based Program for individuals experiencing homelessness Self Pay, No insurance  [facility] [address]  [place], [place] [address] [phone] Christian Based Program for individuals experiencing homelessness Self Pay, No insurance  [facility] [address] [place], [place] [phone] Christian Based Program for males experiencing homelessness Self Pay, No insurance                    

## 2021-09-07 NOTE — Plan of Care (Signed)

## 2021-09-07 NOTE — Discharge Summary (Signed)
Physician Discharge Summary  Carlos Guzman ZOX:[id number] DOB: 1985/12/09 DOA: 09/06/2021  PCP: Patient, No Pcp Per (Inactive)  Admit date: 09/06/2021 Discharge date: 09/07/2021  Admitted From: home Disposition:  home  Recommendations for Outpatient Follow-up:  Follow up with PCP in 1-2 weeks Please obtain BMP/CBC in one week Please follow up on the following pending results:  Home Health:no  Equipment/Devices: none  Discharge Condition: Stable Code Status:   Code Status: Full Code Diet recommendation:  Diet Order             Diet Heart Room service appropriate? Yes; Fluid consistency: Thin  Diet effective now                    Brief/Interim Summary:  Carlos Guzman, 35 y.o. male with PMH of asthma, cannabinoid hyperemesisHIV, history of pancreatitis, HIV,polysubstance abuse presents with due to nausea vomiting and epigastric pain x5 days with nonbloody nonbilious vomiting.  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 In the ED CT abdomen pelvis no acute finding calcific material in the pelvic small bowel appears to represent ingested materials, labs showed leukocytosis AKI, previous baseline creatinine 1.6 hypokalemia.  Patient was placed on IV fluids and admitted for further management.  UDS positive for THC opiate and cocaine Patient was admitted overnight on IV fluids electrolytes repleted.  At this time sodium potassium has improved nicely he is tolerating regular diet no nausea vomiting abdominal pain.  He would like to go home today.  Prescribed potassium and Pepcid and given PCP #4 follow-up I also counseled him up against use of illicit substance  Discharge Diagnoses:   Intractable nausea vomiting abdominal pain Cannabinoid hyperemesis: Previous similar admissions, previous history of pancreatitis.  CT abdomen pelvis no acute finding,.  Now resolved, tolerating regular diet.   AKI and CKD iiia- Baseline creatinine 1.6-1.9>  improved to baseline.  Not tolerating diet.  IV fluids Recent Labs  Lab 09/06/21 0840 09/06/21 1357 09/06/21 2136 09/07/21 0458  BUN 76* 65* 54* 48*  CREATININE 2.84* 2.70* 2.07* 1.77*   Hypokalemia due to 1-potassium repleted and improvED-can take potassium supplementation at home prescription sent continue IV fluids.  Magnesium level is stable. Recent Labs  Lab 09/06/21 0840 09/06/21 1357 09/06/21 2136 09/07/21 0458  K 2.8* 3.2* 2.6* 3.4*    Polysubstance abuse Tobacco abuse: UDS positive as mentioned, TOC consult substance abuse counseling  Leukocytosis likely reactive due to 1, downtrending.  No fever no other evidence of infection  HIV disease continue his home HAART.  Follow-up HIV  VIRAL load and CD4 level  Prolonged QTC monitor, replete electrolytes minimize QT prolonging medication  Hyponatremia: improved to 130. Now tolerating po  Consults: none  Subjective: A lot of oriented x3.  Ate breakfast very well this morning nausea vomiting.  Wants to go home Discharge Exam: Vitals:   09/07/21 0329 09/07/21 0804  BP: (!) 157/99 (!) 141/81  Pulse: 70 71  Resp: 20 18  Temp: 97.6 F (36.4 C) 97.8 F (36.6 C)  SpO2: 99% 100%   General: Pt is alert, awake, not in acute distress Cardiovascular: RRR, S1/S2 +, no rubs, no gallops Respiratory: CTA bilaterally, no wheezing, no rhonchi Abdominal: Soft, NT, ND, bowel sounds + Extremities: no edema, no cyanosis  Discharge Instructions  Discharge Instructions     Discharge instructions   Complete by: As directed    Please call call MD or return to ER for similar or worsening recurring problem that brought  you to hospital or if any fever,nausea/vomiting,abdominal pain, uncontrolled pain, chest pain,  shortness of breath or any other alarming symptoms.  Please follow-up your doctor as instructed in a week time and call the office for appointment.  You have been provided with community wellness number for  follow-up.  Please avoid using illicit substances cannabinoid cocaine smoking and alcohol.  Please avoid alcohol, smoking, or any other illicit substance and maintain healthy habits including taking your regular medications as prescribed.  You were cared for by a hospitalist during your hospital stay. If you have any questions about your discharge medications or the care you received while you were in the hospital after you are discharged, you can call the unit and ask to speak with the hospitalist on call if the hospitalist that took care of you is not available.  Once you are discharged, your primary care physician will handle any further medical issues. Please note that NO REFILLS for any discharge medications will be authorized once you are discharged, as it is imperative that you return to your primary care physician (or establish a relationship with a primary care physician if you do not have one) for your aftercare needs so that they can reassess your need for medications and monitor your lab values   Increase activity slowly   Complete by: As directed       Allergies as of 09/07/2021   No Known Allergies      Medication List     STOP taking these medications    ondansetron 4 MG disintegrating tablet Commonly known as: Zofran ODT       TAKE these medications    dicyclomine 20 MG tablet Commonly known as: BENTYL Take 1 tablet (20 mg total) by mouth 2 (two) times daily for 5 days.   Dovato 50-300 MG tablet Generic drug: dolutegravir-lamiVUDine Take 1 tablet by mouth daily.   famotidine 20 MG tablet Commonly known as: PEPCID Take 1 tablet (20 mg total) by mouth daily.   ondansetron 4 MG tablet Commonly known as: ZOFRAN Take 1 tablet (4 mg total) by mouth every 8 (eight) hours as needed for nausea or vomiting.   potassium chloride SA 20 MEQ tablet Commonly known as: KLOR-CON M Take 1 tablet (20 mEq total) by mouth daily for 7 days.        Follow-up  Information     [facility] Follow up in 1 week(s).   Contact information: [address] [place] [address] [phone]               No Known Allergies  The results of significant diagnostics from this hospitalization (including imaging, microbiology, ancillary and laboratory) are listed below for reference.    Microbiology: Recent Results (from the past 240 hour(s))  Resp Panel by RT-PCR (Flu A&B, Covid) Nasopharyngeal Swab     Status: None   Collection Time: 09/06/21  3:27 PM   Specimen: Nasopharyngeal Swab; Nasopharyngeal(NP) swabs in vial transport medium  Result Value Ref Range Status   SARS Coronavirus 2 by RT PCR NEGATIVE NEGATIVE Final    Comment: (NOTE) SARS-CoV-2 target nucleic acids are NOT DETECTED.  The SARS-CoV-2 RNA is generally detectable in upper respiratory specimens during the acute phase of infection. The lowest concentration of SARS-CoV-2 viral copies this assay can detect is 138 copies/mL. A negative result does not preclude SARS-Cov-2 infection and should not be used as the sole basis for treatment or other patient management decisions.  A negative result may occur with  improper specimen collection/handling, submission of specimen other than nasopharyngeal swab, presence of viral mutation(s) within the areas targeted by this assay, and inadequate number of viral copies(<138 copies/mL). A negative result must be combined with clinical observations, patient history, and epidemiological information. The expected result is Negative.  Fact Sheet for Patients:  BloggerCourse.com  Fact Sheet for Healthcare Providers:  SeriousBroker.it  This test is no t yet approved or cleared by the [place] FDA and  has been authorized for detection and/or diagnosis of SARS-CoV-2 by FDA under an Emergency Use Authorization (EUA). This EUA will remain  in  effect (meaning this test can be used) for the duration of the COVID-19 declaration under Section 564(b)(1) of the Act, 21 U.S.C.section 360bbb-3(b)(1), unless the authorization is terminated  or revoked sooner.       Influenza A by PCR NEGATIVE NEGATIVE Final   Influenza B by PCR NEGATIVE NEGATIVE Final    Comment: (NOTE) The Xpert Xpress SARS-CoV-2/FLU/RSV plus assay is intended as an aid in the diagnosis of influenza from Nasopharyngeal swab specimens and should not be used as a sole basis for treatment. Nasal washings and aspirates are unacceptable for Xpert Xpress SARS-CoV-2/FLU/RSV testing.  Fact Sheet for Patients: BloggerCourse.com  Fact Sheet for Healthcare Providers: SeriousBroker.it  This test is not yet approved or cleared by the [place] FDA and has been authorized for detection and/or diagnosis of SARS-CoV-2 by FDA under an Emergency Use Authorization (EUA). This EUA will remain in effect (meaning this test can be used) for the duration of the COVID-19 declaration under Section 564(b)(1) of the Act, 21 U.S.C. section 360bbb-3(b)(1), unless the authorization is terminated or revoked.  Performed at [facility], [address] [place], [place] [address]     Procedures/Studies: CT Abdomen Pelvis Wo Contrast  Result Date: 09/06/2021 CLINICAL DATA:  Vomiting and abdominal pain. EXAM: CT ABDOMEN AND PELVIS WITHOUT CONTRAST TECHNIQUE: Multidetector CT imaging of the abdomen and pelvis was performed following the standard protocol without IV contrast. COMPARISON:  CT of the abdomen and pelvis with contrast on 07/16/2021 FINDINGS: Lower chest: No acute abnormality. Hepatobiliary: No focal liver abnormality is seen. No gallstones, gallbladder wall thickening, or biliary dilatation. Pancreas: Unremarkable. No pancreatic ductal dilatation or surrounding inflammatory changes. Spleen: Normal in size without focal  abnormality. Adrenals/Urinary Tract: Adrenal glands are unremarkable. Kidneys demonstrate no evidence of hydronephrosis or calculi. The bladder is decompressed. Stomach/Bowel: Bowel shows no evidence of obstruction, ileus or inflammation. There is some calcific material in pelvic small bowel on images 63-65 within the left pelvis which was not present on the prior study and therefore is likely representative of some type of ingested material. No evidence of appendicitis. No free intraperitoneal air. Vascular/Lymphatic: No significant vascular findings are present. No enlarged abdominal or pelvic lymph nodes. Reproductive: Prostate is unremarkable. Other: No abdominal wall hernia or abnormality. No focal abscess or ascites. Musculoskeletal: No acute or significant osseous findings. IMPRESSION: No acute findings in the abdomen or pelvis. Calcific material in pelvic small bowel appears to represent ingested material as no calcification was present in this region on the November CT. Electronically Signed   By: Irish Lack M.D.   On: 09/06/2021 13:03    Labs: BNP (last 3 results) No results for input(s): BNP in the last 8760 hours. Basic Metabolic Panel: Recent Labs  Lab 09/06/21 0840 09/06/21 1357 09/06/21 2136 09/07/21 0458  NA 121* 122* 127* 130*  K 2.8*  3.2* 2.6* 3.4*  CL 78* 82* 89* 91*  CO2 25 28 28 29   GLUCOSE 148* 107* 120* 106*  BUN 76* 65* 54* 48*  CREATININE 2.84* 2.70* 2.07* 1.77*  CALCIUM 9.7 8.9 8.7* 8.6*  MG  --   --  2.6* 2.6*  PHOS  --   --  3.1 2.9   Liver Function Tests: Recent Labs  Lab 09/06/21 0840 09/07/21 0458  AST 24 18  ALT 21 15  ALKPHOS 56 38  BILITOT 2.0* 1.0  PROT 9.2* 6.4*  ALBUMIN 5.2* 3.6   Recent Labs  Lab 09/06/21 0840  LIPASE 43   No results for input(s): AMMONIA in the last 168 hours. CBC: Recent Labs  Lab 09/06/21 0840 09/07/21 0458  WBC 17.8* 13.3*  NEUTROABS  --  8.8*  HGB 19.1* 14.2  HCT 51.5 39.3  MCV 82.7 84.9  PLT 373 288    Cardiac Enzymes: Recent Labs  Lab 09/06/21 2136  CKTOTAL 340   BNP: Invalid input(s): POCBNP CBG: No results for input(s): GLUCAP in the last 168 hours. D-Dimer No results for input(s): DDIMER in the last 72 hours. Hgb A1c No results for input(s): HGBA1C in the last 72 hours. Lipid Profile No results for input(s): CHOL, HDL, LDLCALC, TRIG, CHOLHDL, LDLDIRECT in the last 72 hours. Thyroid function studies Recent Labs    09/07/21 0458  TSH 0.746   Anemia work up No results for input(s): VITAMINB12, FOLATE, FERRITIN, TIBC, IRON, RETICCTPCT in the last 72 hours. Urinalysis    Component Value Date/Time   COLORURINE YELLOW 09/06/2021 0907   APPEARANCEUR CLEAR 09/06/2021 0907   APPEARANCEUR Clear 11/24/2013 1333   LABSPEC >=1.030 09/06/2021 0907   LABSPEC 1.030 11/24/2013 1333   PHURINE 5.5 09/06/2021 0907   GLUCOSEU NEGATIVE 09/06/2021 0907   GLUCOSEU Negative 11/24/2013 1333   HGBUR SMALL (A) 09/06/2021 0907   BILIRUBINUR SMALL (A) 09/06/2021 0907   BILIRUBINUR 1+ 11/24/2013 1333   KETONESUR NEGATIVE 09/06/2021 0907   PROTEINUR 100 (A) 09/06/2021 0907   UROBILINOGEN 1.0 01/04/2015 1651   NITRITE NEGATIVE 09/06/2021 0907   LEUKOCYTESUR NEGATIVE 09/06/2021 0907   LEUKOCYTESUR Negative 11/24/2013 1333   Sepsis Labs Invalid input(s): PROCALCITONIN,  WBC,  LACTICIDVEN Microbiology Recent Results (from the past 240 hour(s))  Resp Panel by RT-PCR (Flu A&B, Covid) Nasopharyngeal Swab     Status: None   Collection Time: 09/06/21  3:27 PM   Specimen: Nasopharyngeal Swab; Nasopharyngeal(NP) swabs in vial transport medium  Result Value Ref Range Status   SARS Coronavirus 2 by RT PCR NEGATIVE NEGATIVE Final    Comment: (NOTE) SARS-CoV-2 target nucleic acids are NOT DETECTED.  The SARS-CoV-2 RNA is generally detectable in upper respiratory specimens during the acute phase of infection. The lowest concentration of SARS-CoV-2 viral copies this assay can detect is 138  copies/mL. A negative result does not preclude SARS-Cov-2 infection and should not be used as the sole basis for treatment or other patient management decisions. A negative result may occur with  improper specimen collection/handling, submission of specimen other than nasopharyngeal swab, presence of viral mutation(s) within the areas targeted by this assay, and inadequate number of viral copies(<138 copies/mL). A negative result must be combined with clinical observations, patient history, and epidemiological information. The expected result is Negative.  Fact Sheet for Patients:  09/08/21  Fact Sheet for Healthcare Providers:  BloggerCourse.com  This test is no t yet approved or cleared by the [place] FDA and  has been authorized for detection  and/or diagnosis of SARS-CoV-2 by FDA under an Emergency Use Authorization (EUA). This EUA will remain  in effect (meaning this test can be used) for the duration of the COVID-19 declaration under Section 564(b)(1) of the Act, 21 U.S.C.section 360bbb-3(b)(1), unless the authorization is terminated  or revoked sooner.       Influenza A by PCR NEGATIVE NEGATIVE Final   Influenza B by PCR NEGATIVE NEGATIVE Final    Comment: (NOTE) The Xpert Xpress SARS-CoV-2/FLU/RSV plus assay is intended as an aid in the diagnosis of influenza from Nasopharyngeal swab specimens and should not be used as a sole basis for treatment. Nasal washings and aspirates are unacceptable for Xpert Xpress SARS-CoV-2/FLU/RSV testing.  Fact Sheet for Patients: BloggerCourse.com  Fact Sheet for Healthcare Providers: SeriousBroker.it  This test is not yet approved or cleared by the [place] FDA and has been authorized for detection and/or diagnosis of SARS-CoV-2 by FDA under an Emergency Use Authorization (EUA). This EUA will remain in effect (meaning  this test can be used) for the duration of the COVID-19 declaration under Section 564(b)(1) of the Act, 21 U.S.C. section 360bbb-3(b)(1), unless the authorization is terminated or revoked.  Performed at [facility] [address] [place], [place] [address]      Time coordinating discharge: 25 minutes  SIGNED: Lanae Boast, MD  [facility] 09/07/2021, 9:45 AM  If 7PM-7AM, please contact night-coverage [email or web address]

## 2021-09-07 NOTE — TOC Transition Note (Signed)
Transition of Care [facility]) - CM/SW Discharge Note   Patient Details  Name: Mavric Cortright MRN: [id number] Date of Birth: [birth year]  [facility] CM/SW Contact:  Aloni Chuang, Meriam Sprague, RN Phone Number: 09/07/2021, 10:09 AM   Clinical Narrative:     Substance abuse resources placed on AVS.       Readmission Risk Interventions No flowsheet data found.

## 2021-09-08 LAB — HIV-1 RNA QUANT-NO REFLEX-BLD
HIV 1 RNA Quant: <20 copies/mL
LOG10 HIV-1 RNA: UNDETERMINED log10copy/mL

## 2021-10-24 ENCOUNTER — Emergency Department (HOSPITAL_BASED_OUTPATIENT_CLINIC_OR_DEPARTMENT_OTHER): Payer: Medicaid Other

## 2021-10-24 ENCOUNTER — Other Ambulatory Visit: Payer: Self-pay

## 2021-10-24 ENCOUNTER — Inpatient Hospital Stay (HOSPITAL_BASED_OUTPATIENT_CLINIC_OR_DEPARTMENT_OTHER)
Admission: EM | Admit: 2021-10-24 | Discharge: 2021-10-26 | DRG: 683 | Disposition: A | Discharge status: Home / Self Care | Payer: Medicaid Other | Attending: Internal Medicine | Admitting: Internal Medicine

## 2021-10-24 ENCOUNTER — Encounter (HOSPITAL_BASED_OUTPATIENT_CLINIC_OR_DEPARTMENT_OTHER): Payer: Self-pay

## 2021-10-24 DIAGNOSIS — F1910 Other psychoactive substance abuse, uncomplicated: Secondary | ICD-10-CM | POA: Diagnosis present

## 2021-10-24 DIAGNOSIS — E860 Dehydration: Secondary | ICD-10-CM | POA: Diagnosis present

## 2021-10-24 DIAGNOSIS — Z20822 Contact with and (suspected) exposure to covid-19: Secondary | ICD-10-CM | POA: Diagnosis present

## 2021-10-24 DIAGNOSIS — F1410 Cocaine abuse, uncomplicated: Secondary | ICD-10-CM | POA: Diagnosis present

## 2021-10-24 DIAGNOSIS — Z79899 Other long term (current) drug therapy: Secondary | ICD-10-CM

## 2021-10-24 DIAGNOSIS — Z720 Tobacco use: Secondary | ICD-10-CM | POA: Diagnosis present

## 2021-10-24 DIAGNOSIS — Z21 Asymptomatic human immunodeficiency virus [HIV] infection status: Secondary | ICD-10-CM | POA: Diagnosis present

## 2021-10-24 DIAGNOSIS — F1210 Cannabis abuse, uncomplicated: Secondary | ICD-10-CM | POA: Diagnosis present

## 2021-10-24 DIAGNOSIS — N179 Acute kidney failure, unspecified: Principal | ICD-10-CM | POA: Diagnosis present

## 2021-10-24 DIAGNOSIS — N1831 Chronic kidney disease, stage 3a: Secondary | ICD-10-CM | POA: Diagnosis present

## 2021-10-24 DIAGNOSIS — N390 Urinary tract infection, site not specified: Secondary | ICD-10-CM

## 2021-10-24 DIAGNOSIS — B20 Human immunodeficiency virus [HIV] disease: Secondary | ICD-10-CM | POA: Diagnosis present

## 2021-10-24 DIAGNOSIS — F17210 Nicotine dependence, cigarettes, uncomplicated: Secondary | ICD-10-CM | POA: Diagnosis present

## 2021-10-24 DIAGNOSIS — K3580 Unspecified acute appendicitis: Secondary | ICD-10-CM

## 2021-10-24 DIAGNOSIS — R112 Nausea with vomiting, unspecified: Secondary | ICD-10-CM | POA: Diagnosis present

## 2021-10-24 DIAGNOSIS — K389 Disease of appendix, unspecified: Secondary | ICD-10-CM | POA: Diagnosis present

## 2021-10-24 LAB — CBC WITH DIFFERENTIAL/PLATELET
Abs Immature Granulocytes: 0 10*3/uL (ref 0.00–0.07)
Basophils Absolute: 0.3 10*3/uL — ABNORMAL HIGH (ref 0.0–0.1)
Basophils Relative: 1 %
Eosinophils Absolute: 0 10*3/uL (ref 0.0–0.5)
Eosinophils Relative: 0 %
HCT: 53.8 % — ABNORMAL HIGH (ref 39.0–52.0)
Hemoglobin: 19.1 g/dL — ABNORMAL HIGH (ref 13.0–17.0)
Lymphocytes Relative: 18 %
Lymphs Abs: 5.3 10*3/uL — ABNORMAL HIGH (ref 0.7–4.0)
MCH: 31.2 pg (ref 26.0–34.0)
MCHC: 35.5 g/dL (ref 30.0–36.0)
MCV: 87.9 fL (ref 80.0–100.0)
Monocytes Absolute: 1.5 10*3/uL — ABNORMAL HIGH (ref 0.1–1.0)
Monocytes Relative: 5 %
Neutro Abs: 22.5 10*3/uL — ABNORMAL HIGH (ref 1.7–7.7)
Neutrophils Relative %: 76 %
Platelets: 428 10*3/uL — ABNORMAL HIGH (ref 150–400)
RBC: 6.12 MIL/uL — ABNORMAL HIGH (ref 4.22–5.81)
RDW: 13.5 % (ref 11.5–15.5)
WBC: 29.6 10*3/uL — ABNORMAL HIGH (ref 4.0–10.5)
nRBC: 0 % (ref 0.0–0.2)

## 2021-10-24 LAB — COMPREHENSIVE METABOLIC PANEL
ALT: 17 U/L (ref 0–44)
AST: 20 U/L (ref 15–41)
Albumin: 5.8 g/dL — ABNORMAL HIGH (ref 3.5–5.0)
Alkaline Phosphatase: 66 U/L (ref 38–126)
Anion gap: 23 — ABNORMAL HIGH (ref 5–15)
BUN: 44 mg/dL — ABNORMAL HIGH (ref 6–20)
CO2: 19 mmol/L — ABNORMAL LOW (ref 22–32)
Calcium: 11 mg/dL — ABNORMAL HIGH (ref 8.9–10.3)
Chloride: 91 mmol/L — ABNORMAL LOW (ref 98–111)
Creatinine, Ser: 4.82 mg/dL — ABNORMAL HIGH (ref 0.61–1.24)
GFR, Estimated: 15 mL/min — ABNORMAL LOW (ref >60)
Glucose, Bld: 155 mg/dL — ABNORMAL HIGH (ref 70–99)
Potassium: 4.4 mmol/L (ref 3.5–5.1)
Sodium: 133 mmol/L — ABNORMAL LOW (ref 135–145)
Total Bilirubin: 0.8 mg/dL (ref 0.3–1.2)
Total Protein: 10.4 g/dL — ABNORMAL HIGH (ref 6.5–8.1)

## 2021-10-24 LAB — RESP PANEL BY RT-PCR (FLU A&B, COVID) ARPGX2
Influenza A by PCR: NEGATIVE
Influenza B by PCR: NEGATIVE
SARS Coronavirus 2 by RT PCR: NEGATIVE

## 2021-10-24 LAB — LACTIC ACID, PLASMA: Lactic Acid, Venous: 1.8 mmol/L (ref 0.5–1.9)

## 2021-10-24 LAB — LIPASE, BLOOD: Lipase: 30 U/L (ref 11–51)

## 2021-10-24 MED ORDER — SODIUM CHLORIDE 0.9 % IV BOLUS
1000.0000 mL | Freq: Once | INTRAVENOUS | Status: AC
  Administered 2021-10-24: 1000 mL via INTRAVENOUS

## 2021-10-24 MED ORDER — DROPERIDOL 2.5 MG/ML IJ SOLN: 5.0000 mg | Freq: Once | INTRAMUSCULAR | Status: DC

## 2021-10-24 MED ORDER — LORAZEPAM 2 MG/ML IJ SOLN
1.0000 mg | Freq: Once | INTRAMUSCULAR | Status: AC
  Administered 2021-10-24: 1 mg via INTRAVENOUS
  Filled 2021-10-24: qty 1

## 2021-10-24 MED ORDER — ONDANSETRON HCL 4 MG/2ML IJ SOLN
4.0000 mg | Freq: Once | INTRAMUSCULAR | Status: AC
  Administered 2021-10-24: 4 mg via INTRAVENOUS
  Filled 2021-10-24: qty 2

## 2021-10-24 MED ORDER — SODIUM CHLORIDE 0.9 % IV BOLUS
2000.0000 mL | Freq: Once | INTRAVENOUS | Status: AC
  Administered 2021-10-24: 2000 mL via INTRAVENOUS

## 2021-10-24 MED ORDER — PIPERACILLIN-TAZOBACTAM 3.375 G IVPB
3.3750 g | Freq: Three times a day (TID) | INTRAVENOUS | Status: DC
  Administered 2021-10-24 – 2021-10-26 (×5): 3.375 g via INTRAVENOUS
  Filled 2021-10-24 (×5): qty 50

## 2021-10-24 MED ORDER — FENTANYL CITRATE PF 50 MCG/ML IJ SOSY
50.0000 ug | PREFILLED_SYRINGE | Freq: Once | INTRAMUSCULAR | Status: AC
  Administered 2021-10-24: 50 ug via INTRAVENOUS
  Filled 2021-10-24: qty 1

## 2021-10-24 MED ORDER — SODIUM CHLORIDE 0.9 % IV SOLN
Freq: Once | INTRAVENOUS | Status: AC
Start: 2021-10-24 — End: 2021-10-25

## 2021-10-24 NOTE — ED Notes (Signed)
Pt in bed with eyes closed, pt arouses easily to verbal stim, Water given for po fluid challenge.

## 2021-10-24 NOTE — ED Provider Notes (Signed)
[facility] EMERGENCY DEPARTMENT Provider Note   CSN: [id number] Arrival date & time: 10/24/21  1710     History  Chief Complaint  Patient presents with   Abdominal Pain    Carlos Guzman is a 36 y.o. male history of cannabis hyperemesis here presenting with vomiting.  Patient has been having epigastric and periumbilical pain and vomiting for the last 2 days.  Patient states that he did use some marijuana 3 days ago and then had more pain and vomiting. Denies any alcohol use. Had multiple visits for similar symptoms.   The history is provided by the patient.      Home Medications Prior to Admission medications   Medication Sig Start Date End Date Taking? Authorizing Provider  dicyclomine (BENTYL) 20 MG tablet Take 1 tablet (20 mg total) by mouth 2 (two) times daily for 5 days. 03/26/21 03/31/21  Carlos Guzman A, PA-C  DOVATO 50-300 MG tablet Take 1 tablet by mouth daily. 07/16/21   [provider]  famotidine (PEPCID) 20 MG tablet Take 1 tablet (20 mg total) by mouth daily. 09/07/21 10/07/21  Carlos Boast, MD  ondansetron (ZOFRAN) 4 MG tablet Take 1 tablet (4 mg total) by mouth every 8 (eight) hours as needed for nausea or vomiting. Patient not taking: Reported on 09/06/2021 09/06/21   Terrilee Files, MD  potassium chloride SA (KLOR-CON M) 20 MEQ tablet Take 1 tablet (20 mEq total) by mouth daily for 7 days. 09/07/21 09/14/21  Carlos Boast, MD  metoCLOPramide (REGLAN) 10 MG tablet Take 1 tablet (10 mg total) by mouth every 6 (six) hours as needed for nausea or vomiting. Patient not taking: No sig reported 06/10/19 11/17/20  Molpus, Carlos Ruiz, MD  sucralfate (CARAFATE) 1 g tablet Take 1 tablet (1 g total) by mouth 4 (four) times daily -  with meals and at bedtime. 06/10/19 06/10/19  Molpus, Carlos Ruiz, MD      Allergies    Patient has no known allergies.    Review of Systems   Review of Systems  Gastrointestinal:  Positive for abdominal pain.  All other systems reviewed and are  negative.  Physical Exam Updated Vital Signs BP (!) 148/119 (BP Location: Left Arm)    Pulse (!) 120    Temp 98 F (36.7 C) (Oral)    Resp 18    Ht 6\' 1"  (1.854 m)    Wt 78.9 kg    SpO2 99%    BMI 22.96 kg/m  Physical Exam Vitals and nursing note reviewed.  Constitutional:      Comments: Vomiting, uncomfortable  HENT:     Head: Normocephalic.     Mouth/Throat:     Mouth: Mucous membranes are moist.  Eyes:     Extraocular Movements: Extraocular movements intact.     Pupils: Pupils are equal, round, and reactive to light.  Cardiovascular:     Rate and Rhythm: Regular rhythm. Tachycardia present.  Abdominal:     General: Abdomen is flat.     Comments: Mild LUQ tenderness, no CVAT   Skin:    General: Skin is warm.     Capillary Refill: Capillary refill takes less than 2 seconds.  Neurological:     General: No focal deficit present.     Mental Status: He is alert and oriented to person, place, and time.  Psychiatric:        Mood and Affect: Mood normal.        Behavior: Behavior normal.    ED Results /  Procedures / Treatments   Labs (all labs ordered are listed, but only abnormal results are displayed) Labs Reviewed  CBC WITH DIFFERENTIAL/PLATELET  COMPREHENSIVE METABOLIC PANEL  LIPASE, BLOOD  URINALYSIS, ROUTINE W REFLEX MICROSCOPIC  RAPID URINE DRUG SCREEN, HOSP PERFORMED    EKG EKG Interpretation  Date/Time:  Wednesday October 24 2021 17:48:26 EST Ventricular Rate:  117 PR Interval:  133 QRS Duration: 81 QT Interval:  306 QTC Calculation: 427 R Axis:   99 Text Interpretation: Sinus tachycardia Right atrial enlargement Borderline right axis deviation Consider left ventricular hypertrophy Borderline T abnormalities, inferior leads Anterior ST elevation, probably due to LVH rate faster than previous, QTc normal today Confirmed by Carlos Guzman [phone]) on 10/24/2021 5:52:23 PM  Radiology DG Abd Portable 1 View  Result Date: 10/24/2021 CLINICAL DATA:  Left flank  pain, nausea and vomiting for 3 days EXAM: PORTABLE ABDOMEN - 1 VIEW COMPARISON:  07/27/2014, 09/06/2021 FINDINGS: Supine frontal view of the abdomen and pelvis excludes the hemidiaphragms by collimation. No bowel obstruction or ileus. There are no masses or abnormal calcifications. Stable vascular calcifications within the lower pelvis. No acute bony abnormalities. IMPRESSION: 1. No radiopaque urinary tract calculi. 2. Unremarkable bowel gas pattern. Electronically Signed   By: Sharlet Salina M.D.   On: 10/24/2021 17:53    Procedures Procedures    CRITICAL CARE Performed by: Carlos Guzman   Total critical care time: 30 minutes  Critical care time was exclusive of separately billable procedures and treating other patients.  Critical care was necessary to treat or prevent imminent or life-threatening deterioration.  Critical care was time spent personally by me on the following activities: development of treatment plan with patient and/or surrogate as well as nursing, discussions with consultants, evaluation of patient's response to treatment, examination of patient, obtaining history from patient or surrogate, ordering and performing treatments and interventions, ordering and review of laboratory studies, ordering and review of radiographic studies, pulse oximetry and re-evaluation of patient's condition.   Medications Ordered in ED Medications  droperidol (INAPSINE) 2.5 MG/ML injection 5 mg (0 mg Intravenous Hold 10/24/21 1811)  sodium chloride 0.9 % bolus 2,000 mL (2,000 mLs Intravenous New Bag/Given 10/24/21 1752)  LORazepam (ATIVAN) injection 1 mg (1 mg Intravenous Given 10/24/21 1754)  fentaNYL (SUBLIMAZE) injection 50 mcg (50 mcg Intravenous Given 10/24/21 1754)    ED Course/ Medical Decision Making/ A&P                           Medical Decision Making [name] is a 36 y.o. male presenting with left upper quadrant pain and epigastric pain and vomiting.  Patient has a history of  cannabis hyperemesis.  Patient did use marijuana 3 days ago.  I think this is exacerbation of his cannabis hyperemesis.  Plan to get CBC and CMP and lipase and UDS.  Plan to hydrate and reassess.  Patient has previous prolonged QT so we will try Ativan for now and get EKG.   8:14 PM Patient's white blood cell count is 30.  Patient also has acute renal failure with creatinine of 4.8.  Patient has anion gap of 23.  CT showed dilated and fluid-filled appendix measuring up to about 10 cm.  Initially he has more left-sided abdominal tenderness.  I reassessed him just now and he does have some focal right lower quadrant tenderness.  I consulted surgery (Dr. Harlon Flor) to see patient.  But given his acute renal failure and dehydration  anion gap acidosis, patient will be admitted to the hospital service.  I ordered Zosyn.   Problems Addressed: Acute appendicitis, unspecified acute appendicitis type: acute illness or injury AKI (acute kidney injury) (HCC): acute illness or injury  Amount and/or Complexity of Data Reviewed Labs: ordered. Decision-making details documented in ED Course. Radiology: ordered and independent interpretation performed. Decision-making details documented in ED Course. ECG/medicine tests: ordered.  Risk Prescription drug management. Decision regarding hospitalization. Emergency major surgery.  Final Clinical Impression(s) / ED Diagnoses Final diagnoses:  None    Rx / DC Orders ED Discharge Orders     None         Charlynne Pander, MD 10/24/21 2029

## 2021-10-24 NOTE — Progress Notes (Signed)
Pharmacy Antibiotic Note  Carlos Guzman is a 36 y.o. male admitted on 10/24/2021 with  appendicitis .  Pharmacy has been consulted for Zosyn dosing.  Plan: Zosyn 3.375g IV q8h (4 hour infusion). Trend WBC, temp, renal function  F/U infectious work-up  Height: 6\' 1"  (185.4 cm) Weight: 78.9 kg (174 lb) IBW/kg (Calculated) : 79.9  Temp (24hrs), Avg:98 F (36.7 C), Min:98 F (36.7 C), Max:98 F (36.7 C)  Recent Labs  Lab 10/24/21 1758  WBC 29.6*  CREATININE 4.82*    Estimated Creatinine Clearance: 23.4 mL/min (A) (by C-G formula based on SCr of 4.82 mg/dL (H)).    No Known Allergies  Antimicrobials this admission: Zosyn 2/15 >>   Dose adjustments this admission: None  Microbiology results: 2/15 BCx: Pending   Thank you for allowing pharmacy to be a part of this patients care.  Joseph Art, Pharm.D. PGY-1 Pharmacy Resident [phone] 10/24/2021 8:23 PM

## 2021-10-24 NOTE — ED Triage Notes (Signed)
Pt c/o abd pain, left flank pain.n/v x 3 days-NAD-steady gait

## 2021-10-25 ENCOUNTER — Encounter (HOSPITAL_COMMUNITY): Payer: Self-pay | Admitting: Internal Medicine

## 2021-10-25 DIAGNOSIS — Z79899 Other long term (current) drug therapy: Secondary | ICD-10-CM | POA: Diagnosis not present

## 2021-10-25 DIAGNOSIS — B20 Human immunodeficiency virus [HIV] disease: Secondary | ICD-10-CM | POA: Diagnosis present

## 2021-10-25 DIAGNOSIS — R112 Nausea with vomiting, unspecified: Secondary | ICD-10-CM | POA: Diagnosis present

## 2021-10-25 DIAGNOSIS — F1910 Other psychoactive substance abuse, uncomplicated: Secondary | ICD-10-CM | POA: Diagnosis present

## 2021-10-25 DIAGNOSIS — R1013 Epigastric pain: Secondary | ICD-10-CM | POA: Diagnosis not present

## 2021-10-25 DIAGNOSIS — N1831 Chronic kidney disease, stage 3a: Secondary | ICD-10-CM | POA: Diagnosis present

## 2021-10-25 DIAGNOSIS — K389 Disease of appendix, unspecified: Secondary | ICD-10-CM | POA: Diagnosis present

## 2021-10-25 DIAGNOSIS — N179 Acute kidney failure, unspecified: Secondary | ICD-10-CM | POA: Diagnosis not present

## 2021-10-25 DIAGNOSIS — F17210 Nicotine dependence, cigarettes, uncomplicated: Secondary | ICD-10-CM | POA: Diagnosis present

## 2021-10-25 DIAGNOSIS — Z20822 Contact with and (suspected) exposure to covid-19: Secondary | ICD-10-CM | POA: Diagnosis present

## 2021-10-25 DIAGNOSIS — E860 Dehydration: Secondary | ICD-10-CM | POA: Diagnosis present

## 2021-10-25 DIAGNOSIS — F1410 Cocaine abuse, uncomplicated: Secondary | ICD-10-CM | POA: Diagnosis present

## 2021-10-25 DIAGNOSIS — F1210 Cannabis abuse, uncomplicated: Secondary | ICD-10-CM | POA: Diagnosis present

## 2021-10-25 DIAGNOSIS — R1084 Generalized abdominal pain: Secondary | ICD-10-CM | POA: Diagnosis not present

## 2021-10-25 DIAGNOSIS — R933 Abnormal findings on diagnostic imaging of other parts of digestive tract: Secondary | ICD-10-CM | POA: Diagnosis not present

## 2021-10-25 LAB — CBC
HCT: 44.8 % (ref 39.0–52.0)
Hemoglobin: 14.7 g/dL (ref 13.0–17.0)
MCH: 30.1 pg (ref 26.0–34.0)
MCHC: 32.8 g/dL (ref 30.0–36.0)
MCV: 91.8 fL (ref 80.0–100.0)
Platelets: 311 10*3/uL (ref 150–400)
RBC: 4.88 MIL/uL (ref 4.22–5.81)
RDW: 13.7 % (ref 11.5–15.5)
WBC: 15.7 10*3/uL — ABNORMAL HIGH (ref 4.0–10.5)
nRBC: 0 % (ref 0.0–0.2)

## 2021-10-25 LAB — RAPID URINE DRUG SCREEN, HOSP PERFORMED
Amphetamines: POSITIVE — AB
Barbiturates: NOT DETECTED
Benzodiazepines: NOT DETECTED
Cocaine: POSITIVE — AB
Opiates: NOT DETECTED
Tetrahydrocannabinol: POSITIVE — AB

## 2021-10-25 LAB — BASIC METABOLIC PANEL
Anion gap: 8 (ref 5–15)
BUN: 36 mg/dL — ABNORMAL HIGH (ref 6–20)
CO2: 24 mmol/L (ref 22–32)
Calcium: 8.9 mg/dL (ref 8.9–10.3)
Chloride: 105 mmol/L (ref 98–111)
Creatinine, Ser: 2.1 mg/dL — ABNORMAL HIGH (ref 0.61–1.24)
GFR, Estimated: 41 mL/min — ABNORMAL LOW (ref >60)
Glucose, Bld: 114 mg/dL — ABNORMAL HIGH (ref 70–99)
Potassium: 4 mmol/L (ref 3.5–5.1)
Sodium: 137 mmol/L (ref 135–145)

## 2021-10-25 LAB — URINALYSIS, MICROSCOPIC (REFLEX)

## 2021-10-25 LAB — URINALYSIS, ROUTINE W REFLEX MICROSCOPIC
Bilirubin Urine: NEGATIVE
Glucose, UA: NEGATIVE mg/dL
Ketones, ur: 15 mg/dL — AB
Nitrite: NEGATIVE
Protein, ur: 30 mg/dL — AB
Specific Gravity, Urine: 1.025 (ref 1.005–1.030)
pH: 5.5 (ref 5.0–8.0)

## 2021-10-25 MED ORDER — NICOTINE 14 MG/24HR TD PT24
14.0000 mg | MEDICATED_PATCH | Freq: Every day | TRANSDERMAL | Status: DC
  Administered 2021-10-25 – 2021-10-26 (×2): 14 mg via TRANSDERMAL
  Filled 2021-10-25 (×2): qty 1

## 2021-10-25 MED ORDER — DOLUTEGRAVIR SODIUM 50 MG PO TABS
50.0000 mg | ORAL_TABLET | Freq: Every day | ORAL | Status: DC
  Administered 2021-10-25 – 2021-10-26 (×2): 50 mg via ORAL
  Filled 2021-10-25 (×2): qty 1

## 2021-10-25 MED ORDER — PANTOPRAZOLE SODIUM 40 MG PO TBEC
40.0000 mg | DELAYED_RELEASE_TABLET | Freq: Every day | ORAL | Status: DC
  Administered 2021-10-26: 40 mg via ORAL
  Filled 2021-10-25: qty 1

## 2021-10-25 MED ORDER — LACTATED RINGERS IV SOLN: INTRAVENOUS | Status: DC

## 2021-10-25 MED ORDER — OXYCODONE HCL 5 MG PO TABS: 5.0000 mg | ORAL_TABLET | ORAL | Status: DC | PRN

## 2021-10-25 MED ORDER — DOLUTEGRAVIR-LAMIVUDINE 50-300 MG PO TABS: 1.0000 | ORAL_TABLET | Freq: Every day | ORAL | Status: DC

## 2021-10-25 MED ORDER — HYDRALAZINE HCL 20 MG/ML IJ SOLN: 10.0000 mg | INTRAMUSCULAR | Status: DC | PRN

## 2021-10-25 MED ORDER — ONDANSETRON HCL 4 MG/2ML IJ SOLN: 4.0000 mg | Freq: Four times a day (QID) | INTRAMUSCULAR | Status: DC | PRN

## 2021-10-25 MED ORDER — MORPHINE SULFATE (PF) 2 MG/ML IV SOLN: 2.0000 mg | INTRAVENOUS | Status: DC | PRN

## 2021-10-25 MED ORDER — ACETAMINOPHEN 325 MG PO TABS: 650.0000 mg | ORAL_TABLET | Freq: Four times a day (QID) | ORAL | Status: DC | PRN

## 2021-10-25 MED ORDER — ACETAMINOPHEN 650 MG RE SUPP: 650.0000 mg | Freq: Four times a day (QID) | RECTAL | Status: DC | PRN

## 2021-10-25 MED ORDER — LAMIVUDINE 150 MG PO TABS
300.0000 mg | ORAL_TABLET | Freq: Every day | ORAL | Status: DC
  Administered 2021-10-25 – 2021-10-26 (×2): 300 mg via ORAL
  Filled 2021-10-25 (×2): qty 2

## 2021-10-25 MED ORDER — ONDANSETRON 4 MG PO TBDP: 4.0000 mg | ORAL_TABLET | Freq: Four times a day (QID) | ORAL | Status: DC | PRN

## 2021-10-25 NOTE — Assessment & Plan Note (Signed)
-  Baseline stage 3a CKD, marked worsening on admission -He has been hydrated and is already responding -Suspect pre-renal azotemia in the setting of recurrent n/v -Continue to avoid nephrotoxic medications -Recheck BMP in AM

## 2021-10-25 NOTE — Progress Notes (Signed)
NEW ADMISSION NOTE New Admission Note:   Arrival Method: stretcher from [facility] Mental Orientation: alert and oriented x4 Telemetry: box 10, NSR Assessment: Completed Skin: intact IV: R AC  Pain: 3/10 Admission: Completed [facility] Orientation: Patient has been orientated to the room, unit and staff.  Family: none at bedside  Admitting notified of patient arrival to the unit. Call bell and personal belongings within reach.  Orville Govern, RN

## 2021-10-25 NOTE — ED Notes (Signed)
Pt has remained NPO.

## 2021-10-25 NOTE — Assessment & Plan Note (Signed)
-  As noted above, abnormal imaging with leukocytosis -However, he is completely asymptomatic now and begging for food/drink -No TTP -Low suspicion for acute appendicitis -Unlikely to need surgery -Will defer abx to surgery

## 2021-10-25 NOTE — Assessment & Plan Note (Signed)
-  Continue Dovato -Recent undetectable viral load and good CD4 count -Continue with outpatient ID f/u

## 2021-10-25 NOTE — Assessment & Plan Note (Addendum)
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

## 2021-10-25 NOTE — Plan of Care (Signed)
  Problem: Education: Goal: Knowledge of General Education information will improve Description: Including pain rating scale, medication(s)/side effects and non-pharmacologic comfort measures Outcome: Progressing   Problem: Clinical Measurements: Goal: Diagnostic test results will improve Outcome: Progressing   Problem: Nutrition: Goal: Adequate nutrition will be maintained Outcome: Progressing   Problem: Coping: Goal: Level of anxiety will decrease Outcome: Progressing   Problem: Pain Managment: Goal: General experience of comfort will improve Outcome: Progressing   

## 2021-10-25 NOTE — Assessment & Plan Note (Signed)
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

## 2021-10-25 NOTE — Consult Note (Signed)
Consult Note  Carlos Guzman 05/16/1986  [place]:[id number].    Requesting MD: Karmen Bongo, MD Chief Complaint/Reason for Consult: Abnormal appendix on CT HPI:  Patient is a 36 year old male who presented to [facility] with abdominal pain and intractable n/v starting 3 days ago. Patient has a history of the same related to cannabis hyperemesis and states this episode has been similar to previous episodes. He reports pain was generalized and not focalized and has since resolved. His nausea and vomiting have also since resolved and he currently is wanting to eat. Emesis was mostly bilious although after vomiting for 2 days he did note some coffee ground like sediment. He reports last marijuana and cocaine usage were about 3 days ago. He has not had a BM in a few days but is passing flatus. He denies bloating. Denies fever and chills. PMH otherwise significant for HIV and he is compliant with HAART therapy, CKD stage III and hx of pancreatitis. He has not had any previous abdominal surgery. He reports current drug and alcohol use and smokes 1 PPD.   ROS: Review of Systems  Constitutional:  Negative for chills and fever.  Respiratory:  Negative for shortness of breath and wheezing.   Cardiovascular:  Negative for chest pain and palpitations.  Gastrointestinal:  Positive for abdominal pain, nausea and vomiting. Negative for blood in stool and melena.  Genitourinary:  Negative for dysuria, frequency and urgency.  Psychiatric/Behavioral:  Positive for substance abuse.   All other systems reviewed and are negative.  Family History  Problem Relation Age of Onset   CAD Neg Hx    Diabetes Neg Hx     Past Medical History:  Diagnosis Date   Asthma    Cannabinoid hyperemesis syndrome    HIV (human immunodeficiency virus infection) ([place])    Pancreatitis    Substance abuse ([place])     Past Surgical History:  Procedure Laterality Date   NO PAST SURGERIES      Social History:  reports that he has  been smoking cigarettes. He has been smoking an average of 1 pack per day. He has never used smokeless tobacco. He reports that he does not currently use alcohol. He reports current drug use. Drugs: Marijuana and Cocaine.  Allergies: No Known Allergies  Medications Prior to Admission  Medication Sig Dispense Refill   DOVATO 50-300 MG tablet Take 1 tablet by mouth daily.     omeprazole (PRILOSEC) 40 MG capsule Take 40 mg by mouth daily.     dicyclomine (BENTYL) 20 MG tablet Take 1 tablet (20 mg total) by mouth 2 (two) times daily for 5 days. (Patient not taking: Reported on 10/25/2021) 10 tablet 0    Blood pressure 123/77, pulse 74, temperature 98.3 F (36.8 C), temperature source Oral, resp. rate 18, height 6\' 1"  (1.854 m), weight 78.9 kg, SpO2 100 %. Physical Exam:  General: pleasant, WD, WN male who is laying in bed in NAD HEENT: head is normocephalic, atraumatic.  Sclera are anicteric. Pupils equal and round.  Ears and nose without any masses or lesions.  Mouth is pink and moist Heart: regular, rate, and rhythm. Palpable radial and pedal pulses bilaterally Lungs: no cough or wheezing. Respiratory effort non-labored on room air  Abd: soft, NT, ND, no masses, hernias, or organomegaly MS: all 4 extremities are symmetrical with no cyanosis, clubbing, or edema. Skin: warm and dry with no masses, lesions, or rashes Neuro: Cranial nerves 2-12 grossly intact, sensation is normal  throughout Psych: A&Ox3 with an appropriate affect.   Results for orders placed or performed during the hospital encounter of 10/24/21 (from the past 48 hour(s))  CBC with Differential/Platelet     Status: Abnormal   Collection Time: 10/24/21  5:58 PM  Result Value Ref Range   WBC 29.6 (H) 4.0 - 10.5 K/uL    Comment: REPEATED TO VERIFY WHITE COUNT CONFIRMED ON SMEAR    RBC 6.12 (H) 4.22 - 5.81 MIL/uL   Hemoglobin 19.1 (H) 13.0 - 17.0 g/dL   HCT 53.8 (H) 39.0 - 52.0 %   MCV 87.9 80.0 - 100.0 fL   MCH 31.2 26.0 -  34.0 pg   MCHC 35.5 30.0 - 36.0 g/dL   RDW 13.5 11.5 - 15.5 %   Platelets 428 (H) 150 - 400 K/uL   nRBC 0.0 0.0 - 0.2 %   Neutrophils Relative % 76 %   Neutro Abs 22.5 (H) 1.7 - 7.7 K/uL   Lymphocytes Relative 18 %   Lymphs Abs 5.3 (H) 0.7 - 4.0 K/uL   Monocytes Relative 5 %   Monocytes Absolute 1.5 (H) 0.1 - 1.0 K/uL   Eosinophils Relative 0 %   Eosinophils Absolute 0.0 0.0 - 0.5 K/uL   Basophils Relative 1 %   Basophils Absolute 0.3 (H) 0.0 - 0.1 K/uL   WBC Morphology MORPHOLOGY UNREMARKABLE    RBC Morphology MORPHOLOGY UNREMARKABLE    Smear Review MORPHOLOGY UNREMARKABLE    Abs Immature Granulocytes 0.00 0.00 - 0.07 K/uL    Comment: Performed at [facility], [address] [place], [place] [address]  Comprehensive metabolic panel     Status: Abnormal   Collection Time: 10/24/21  5:58 PM  Result Value Ref Range   Sodium 133 (L) 135 - 145 mmol/L   Potassium 4.4 3.5 - 5.1 mmol/L   Chloride 91 (L) 98 - 111 mmol/L   CO2 19 (L) 22 - 32 mmol/L   Glucose, Bld 155 (H) 70 - 99 mg/dL    Comment: Glucose reference range applies only to samples taken after fasting for at least 8 hours.   BUN 44 (H) 6 - 20 mg/dL   Creatinine, Ser 4.82 (H) 0.61 - 1.24 mg/dL   Calcium 11.0 (H) 8.9 - 10.3 mg/dL   Total Protein 10.4 (H) 6.5 - 8.1 g/dL   Albumin 5.8 (H) 3.5 - 5.0 g/dL   AST 20 15 - 41 U/L   ALT 17 0 - 44 U/L   Alkaline Phosphatase 66 38 - 126 U/L   Total Bilirubin 0.8 0.3 - 1.2 mg/dL   GFR, Estimated 15 (L) >60 mL/min    Comment: (NOTE) Calculated using the CKD-EPI Creatinine Equation (2021)    Anion gap 23 (H) 5 - 15    Comment: Electrolytes repeated to confirm. Performed at [facility], [address] [place], [place] [address]   Lipase, blood     Status: None   Collection Time: 10/24/21  5:58 PM  Result Value Ref Range   Lipase 30 11 - 51 U/L    Comment: Performed at [facility], [address] [place], [place] [address]  Resp Panel by  RT-PCR (Flu A&B, Covid) Nasopharyngeal Swab     Status: None   Collection Time: 10/24/21  7:57 PM   Specimen: Nasopharyngeal Swab; Nasopharyngeal(NP) swabs in vial transport medium  Result Value Ref Range   SARS Coronavirus 2 by RT PCR NEGATIVE NEGATIVE  Comment: (NOTE) SARS-CoV-2 target nucleic acids are NOT DETECTED.  The SARS-CoV-2 RNA is generally detectable in upper respiratory specimens during the acute phase of infection. The lowest concentration of SARS-CoV-2 viral copies this assay can detect is 138 copies/mL. A negative result does not preclude SARS-Cov-2 infection and should not be used as the sole basis for treatment or other patient management decisions. A negative result may occur with  improper specimen collection/handling, submission of specimen other than nasopharyngeal swab, presence of viral mutation(s) within the areas targeted by this assay, and inadequate number of viral copies(<138 copies/mL). A negative result must be combined with clinical observations, patient history, and epidemiological information. The expected result is Negative.  Fact Sheet for Patients:  EntrepreneurPulse.com.au  Fact Sheet for Healthcare Providers:  IncredibleEmployment.be  This test is no t yet approved or cleared by the [place] FDA and  has been authorized for detection and/or diagnosis of SARS-CoV-2 by FDA under an Emergency Use Authorization (EUA). This EUA will remain  in effect (meaning this test can be used) for the duration of the COVID-19 declaration under Section 564(b)(1) of the Act, 21 U.S.C.section 360bbb-3(b)(1), unless the authorization is terminated  or revoked sooner.       Influenza A by PCR NEGATIVE NEGATIVE   Influenza B by PCR NEGATIVE NEGATIVE    Comment: (NOTE) The Xpert Xpress SARS-CoV-2/FLU/RSV plus assay is intended as an aid in the diagnosis of influenza from Nasopharyngeal swab specimens and should not be  used as a sole basis for treatment. Nasal washings and aspirates are unacceptable for Xpert Xpress SARS-CoV-2/FLU/RSV testing.  Fact Sheet for Patients: EntrepreneurPulse.com.au  Fact Sheet for Healthcare Providers: IncredibleEmployment.be  This test is not yet approved or cleared by the [place] FDA and has been authorized for detection and/or diagnosis of SARS-CoV-2 by FDA under an Emergency Use Authorization (EUA). This EUA will remain in effect (meaning this test can be used) for the duration of the COVID-19 declaration under Section 564(b)(1) of the Act, 21 U.S.C. section 360bbb-3(b)(1), unless the authorization is terminated or revoked.  Performed at [facility], [address] [place], [place] [address]   Lactic acid, plasma     Status: None   Collection Time: 10/24/21  7:57 PM  Result Value Ref Range   Lactic Acid, Venous 1.8 0.5 - 1.9 mmol/L    Comment: Performed at [facility], [address] [place], [place] [address]  Blood culture (routine x 2)     Status: None (Preliminary result)   Collection Time: 10/24/21  8:02 PM   Specimen: BLOOD  Result Value Ref Range   Specimen Description      BLOOD RIGHT ANTECUBITAL Performed at [facility], [address] [place], [place] [address]    Special Requests      BOTTLES DRAWN AEROBIC AND ANAEROBIC Blood Culture adequate volume Performed at [facility], [address] [place], [place] [address]    Culture      NO GROWTH < 12 HOURS Performed at [facility], [place] [address], [place], [place] [address]    Report Status PENDING   Blood culture (routine x 2)     Status: None (Preliminary result)   Collection Time: 10/24/21  8:10 PM   Specimen: BLOOD  Result Value Ref Range   Specimen Description      BLOOD LEFT ANTECUBITAL Performed at [facility], [address] [place],  [place] [address]    Special Requests       BOTTLES DRAWN AEROBIC AND ANAEROBIC Blood Culture adequate volume Performed at [facility], [address] [place], [place] [address]    Culture      NO GROWTH < 12 HOURS Performed at [facility] [address], [place], [place] [address]    Report Status PENDING   Urinalysis, Routine w reflex microscopic Urine, Clean Catch     Status: Abnormal   Collection Time: 10/25/21  2:55 AM  Result Value Ref Range   Color, Urine YELLOW YELLOW   APPearance HAZY (A) CLEAR   Specific Gravity, Urine 1.025 1.005 - 1.030   pH 5.5 5.0 - 8.0   Glucose, UA NEGATIVE NEGATIVE mg/dL   Hgb urine dipstick TRACE (A) NEGATIVE   Bilirubin Urine NEGATIVE NEGATIVE   Ketones, ur 15 (A) NEGATIVE mg/dL   Protein, ur 30 (A) NEGATIVE mg/dL   Nitrite NEGATIVE NEGATIVE   Leukocytes,Ua SMALL (A) NEGATIVE    Comment: Performed at [facility], [address] [place], [place] [address]  Rapid urine drug screen (hospital performed)     Status: Abnormal   Collection Time: 10/25/21  2:55 AM  Result Value Ref Range   Opiates NONE DETECTED NONE DETECTED   Cocaine POSITIVE (A) NONE DETECTED   Benzodiazepines NONE DETECTED NONE DETECTED   Amphetamines POSITIVE (A) NONE DETECTED   Tetrahydrocannabinol POSITIVE (A) NONE DETECTED   Barbiturates NONE DETECTED NONE DETECTED    Comment: (NOTE) DRUG SCREEN FOR MEDICAL PURPOSES ONLY.  IF CONFIRMATION IS NEEDED FOR ANY PURPOSE, NOTIFY LAB WITHIN 5 DAYS.  LOWEST DETECTABLE LIMITS FOR URINE DRUG SCREEN Drug Class                     Cutoff (ng/mL) Amphetamine and metabolites    1000 Barbiturate and metabolites    200 Benzodiazepine                 [id number] Tricyclics and metabolites     300 Opiates and metabolites        300 Cocaine and metabolites        300 THC                            50 Performed at [facility], [address] [place], [place] [address]   Urinalysis, Microscopic (reflex)     Status: Abnormal   Collection  Time: 10/25/21  2:55 AM  Result Value Ref Range   RBC / HPF 0-5 0 - 5 RBC/hpf   WBC, UA 6-10 0 - 5 WBC/hpf   Bacteria, UA MANY (A) NONE SEEN   Squamous Epithelial / LPF 6-10 0 - 5   Non Squamous Epithelial PRESENT (A) NONE SEEN   Mucus PRESENT    Hyaline Casts, UA PRESENT    Crystals SULFONAMIDE CRYSTALS PRESENT (A) NEGATIVE    Comment: Performed at [facility], [address] [place], [place] [id number]  Basic metabolic panel     Status: Abnormal   Collection Time: 10/25/21  7:18 AM  Result Value Ref Range   Sodium 137 135 - 145 mmol/L   Potassium 4.0 3.5 - 5.1 mmol/L   Chloride 105 98 - 111 mmol/L   CO2 24 22 - 32 mmol/L   Glucose, Bld 114 (H) 70 - 99 mg/dL    Comment: Glucose  reference range applies only to samples taken after fasting for at least 8 hours.   BUN 36 (H) 6 - 20 mg/dL   Creatinine, Ser 2.10 (H) 0.61 - 1.24 mg/dL   Calcium 8.9 8.9 - 10.3 mg/dL    Comment: DELTA CHECK NOTED   GFR, Estimated 41 (L) >60 mL/min    Comment: (NOTE) Calculated using the CKD-EPI Creatinine Equation (2021)    Anion gap 8 5 - 15    Comment: Performed at [facility], [address] [place], [place] [address]   DG Abd Portable 1 View  Result Date: 10/24/2021 CLINICAL DATA:  Left flank pain, nausea and vomiting for 3 days EXAM: PORTABLE ABDOMEN - 1 VIEW COMPARISON:  07/27/2014, 09/06/2021 FINDINGS: Supine frontal view of the abdomen and pelvis excludes the hemidiaphragms by collimation. No bowel obstruction or ileus. There are no masses or abnormal calcifications. Stable vascular calcifications within the lower pelvis. No acute bony abnormalities. IMPRESSION: 1. No radiopaque urinary tract calculi. 2. Unremarkable bowel gas pattern. Electronically Signed   By: Randa Ngo M.D.   On: 10/24/2021 17:53   CT Renal Stone Study  Result Date: 10/24/2021 CLINICAL DATA:  Flank pain, kidney stone EXAM: CT ABDOMEN AND PELVIS WITHOUT CONTRAST TECHNIQUE: Multidetector CT imaging  of the abdomen and pelvis was performed following the standard protocol without IV contrast. RADIATION DOSE REDUCTION: This exam was performed according to the departmental dose-optimization program which includes automated exposure control, adjustment of the mA and/or kV according to patient size and/or use of iterative reconstruction technique. COMPARISON:  09/06/2021 FINDINGS: Lower chest: Lung bases are clear. Hepatobiliary: Unenhanced liver is unremarkable. Gallbladder is unremarkable. No intrahepatic or extrahepatic ductal dilatation. Pancreas: Within normal limits. Spleen: Within normal limits. Adrenals/Urinary Tract: Adrenal glands are within normal limits. Kidneys are within normal limits. No renal, ureteral, or bladder calculi. No hydronephrosis. Bladder is within normal limits. Stomach/Bowel: Stomach is within normal limits. No evidence of bowel obstruction. Mildly prominent, fluid-filled appendix in the right lower quadrant, measuring up to 10 mm in diameter (series 2/image 66), without convincing periappendiceal inflammatory changes but abnormal. No colonic wall thickening or inflammatory changes. Vascular/Lymphatic: No evidence of abdominal aortic aneurysm. No suspicious abdominopelvic lymphadenopathy. Reproductive: Prostate is unremarkable. Other: No abdominopelvic ascites. Musculoskeletal: Visualized osseous structures are within normal limits. IMPRESSION: Dilated, fluid-filled appendix, measuring up to 10 mm in diameter, without convincing periappendiceal inflammatory changes but abnormal. Correlate for signs/symptoms of early acute appendicitis. Otherwise negative CT abdomen/pelvis. Specifically, no renal, ureteral, or bladder calculi. No hydronephrosis. Electronically Signed   By: Julian Hy M.D.   On: 10/24/2021 20:01      Assessment/Plan Hx of hyperemesis related to chronic cannabinoid use Abdominal pain with nausea and vomiting Abnormal appendix on CT - CT renal study 2/15 with  dilated fluid-filled appendix without periappendiceal inflammation - WBC 29K yesterday but Cr was also 4, suspect he was dehydrated/concentrated - recommend repeat CBC today - patient is completely non-tender on exam and reports pain and nausea/vomiting have resolved. He reports the pain was similar to previous episodes of hyperemesis related to chronic cannabinoid use.  - he was started on Zosyn, will discuss with MD whether we ought to continue this vs watch off abx - based on current exam and history, I would not recommend laparoscopic appendectomy at this time but we will follow for serial abdominal exams   FEN: CLD, IVF VTE: ok to have chemical prophylaxis from a surgical standpoint  ID: Zosyn 2/15>>  HIV  AKI on CKD stage III Tobacco abuse Polysubstance abuse - pt reports cocaine and marijuana usage, UDS also positive for amphetamines, patient reports recent decrease in alcohol use but still drinking regularly  Hx of pancreatitis   I reviewed ED provider notes, hospitalist notes, last 24 h vitals and pain scores, last 48 h intake and output, last 24 h labs and trends, and last 24 h imaging results.  This care required moderate level of medical decision making.   Norm Parcel, [facility] Surgery 10/25/2021, 1:03 PM Please see Amion for pager number during day hours 7:00am-4:30pm

## 2021-10-25 NOTE — H&P (Signed)
History and Physical    Patient: Carlos Guzman [id number] DOB: 1986/05/10 DOA: 10/24/2021 DOS: the patient was seen and examined on 10/25/2021 PCP: Patient, No Pcp Per (Inactive)  Patient coming from: Home - lives with mother; Donald Prose: Mother, [phone]   Chief Complaint: N/V  HPI: Carlos Guzman is a 36 y.o. male with medical history significant of cannabinoid hyperemesis syndrome; HIV; and polysubstance abuse presenting with n/v.  He reports that started with n/v a couple of days ago.  This was similar to prior episodes but not as bad.  No fever.  +periumbilical abdominal pain, resolved.  No further n/v since last night.  Last cocaine was 2-3 days ago, THC yesterday.  He wasn't able to eat or drink for a couple of days.    ER Course:  [facility] to [facility] transfer, per Dr. Alcario Drought:  Pt with AKF, Intractable N/V, h/o recurrent canabinoid hyperemesis syndrome and cocaine on board. This time though looks like he may have acute appendicitis. Sending to [facility] due to Creat of 5, hydrating. Gen surg wants creat better before they operate.     Review of Systems: As mentioned in the history of present illness. All other systems reviewed and are negative. Past Medical History:  Diagnosis Date   Asthma    Cannabinoid hyperemesis syndrome    HIV (human immunodeficiency virus infection) ([place])    Pancreatitis    Substance abuse ([place])    Past Surgical History:  Procedure Laterality Date   NO PAST SURGERIES     Social History:  reports that he has been smoking cigarettes. He has been smoking an average of 1 pack per day. He has never used smokeless tobacco. He reports that he does not currently use alcohol. He reports current drug use. Drugs: Marijuana and Cocaine.  No Known Allergies  Family History  Problem Relation Age of Onset   CAD Neg Hx    Diabetes Neg Hx     Prior to Admission medications   Medication Sig Start Date End Date Taking? Authorizing Provider  dicyclomine (BENTYL) 20 MG  tablet Take 1 tablet (20 mg total) by mouth 2 (two) times daily for 5 days. 03/26/21 03/31/21  Providence Lanius A, PA-C  DOVATO 50-300 MG tablet Take 1 tablet by mouth daily. 07/16/21   [provider]  famotidine (PEPCID) 20 MG tablet Take 1 tablet (20 mg total) by mouth daily. 09/07/21 10/07/21  Antonieta Pert, MD  ondansetron (ZOFRAN) 4 MG tablet Take 1 tablet (4 mg total) by mouth every 8 (eight) hours as needed for nausea or vomiting. Patient not taking: Reported on 09/06/2021 09/06/21   Hayden Rasmussen, MD  potassium chloride SA (KLOR-CON M) 20 MEQ tablet Take 1 tablet (20 mEq total) by mouth daily for 7 days. 09/07/21 09/14/21  Antonieta Pert, MD  metoCLOPramide (REGLAN) 10 MG tablet Take 1 tablet (10 mg total) by mouth every 6 (six) hours as needed for nausea or vomiting. Patient not taking: No sig reported 06/10/19 11/17/20  Molpus, Jenny Reichmann, MD  sucralfate (CARAFATE) 1 g tablet Take 1 tablet (1 g total) by mouth 4 (four) times daily -  with meals and at bedtime. 06/10/19 06/10/19  Molpus, Jenny Reichmann, MD    Physical Exam: Vitals:   10/25/21 0900 10/25/21 0933 10/25/21 1029 10/25/21 1700  BP: 112/66 126/80 123/77 128/73  Pulse: 81 70 74 71  Resp: 18 18 18 18   Temp:  98 F (36.7 C) 98.3 F (36.8 C) 98.2 F (36.8 C)  TempSrc:   Oral  SpO2: 100% 100% 100% 99%  Weight:      Height:       General:  Appears calm and comfortable and is in NAD Eyes:   EOMI, normal lids, iris ENT:  grossly normal hearing, lips & tongue, mmm Neck:  no LAD, masses or thyromegaly Cardiovascular:  RRR, no m/r/g. No LE edema.  Respiratory:   CTA bilaterally with no wheezes/rales/rhonchi.  Normal respiratory effort. Abdomen:  soft, NT, ND, NABS Skin:  no rash or induration seen on limited exam Musculoskeletal:  grossly normal tone BUE/BLE, good ROM, no bony abnormality Psychiatric:  blunted mood and affect, speech fluent and appropriate, AOx3 Neurologic:  CN 2-12 grossly intact, moves all extremities in coordinated  fashion   Radiological Exams on Admission: Independently reviewed - see discussion in A/P where applicable  DG Abd Portable 1 View  Result Date: 10/24/2021 CLINICAL DATA:  Left flank pain, nausea and vomiting for 3 days EXAM: PORTABLE ABDOMEN - 1 VIEW COMPARISON:  07/27/2014, 09/06/2021 FINDINGS: Supine frontal view of the abdomen and pelvis excludes the hemidiaphragms by collimation. No bowel obstruction or ileus. There are no masses or abnormal calcifications. Stable vascular calcifications within the lower pelvis. No acute bony abnormalities. IMPRESSION: 1. No radiopaque urinary tract calculi. 2. Unremarkable bowel gas pattern. Electronically Signed   By: Randa Ngo M.D.   On: 10/24/2021 17:53   CT Renal Stone Study  Result Date: 10/24/2021 CLINICAL DATA:  Flank pain, kidney stone EXAM: CT ABDOMEN AND PELVIS WITHOUT CONTRAST TECHNIQUE: Multidetector CT imaging of the abdomen and pelvis was performed following the standard protocol without IV contrast. RADIATION DOSE REDUCTION: This exam was performed according to the departmental dose-optimization program which includes automated exposure control, adjustment of the mA and/or kV according to patient size and/or use of iterative reconstruction technique. COMPARISON:  09/06/2021 FINDINGS: Lower chest: Lung bases are clear. Hepatobiliary: Unenhanced liver is unremarkable. Gallbladder is unremarkable. No intrahepatic or extrahepatic ductal dilatation. Pancreas: Within normal limits. Spleen: Within normal limits. Adrenals/Urinary Tract: Adrenal glands are within normal limits. Kidneys are within normal limits. No renal, ureteral, or bladder calculi. No hydronephrosis. Bladder is within normal limits. Stomach/Bowel: Stomach is within normal limits. No evidence of bowel obstruction. Mildly prominent, fluid-filled appendix in the right lower quadrant, measuring up to 10 mm in diameter (series 2/image 66), without convincing periappendiceal inflammatory  changes but abnormal. No colonic wall thickening or inflammatory changes. Vascular/Lymphatic: No evidence of abdominal aortic aneurysm. No suspicious abdominopelvic lymphadenopathy. Reproductive: Prostate is unremarkable. Other: No abdominopelvic ascites. Musculoskeletal: Visualized osseous structures are within normal limits. IMPRESSION: Dilated, fluid-filled appendix, measuring up to 10 mm in diameter, without convincing periappendiceal inflammatory changes but abnormal. Correlate for signs/symptoms of early acute appendicitis. Otherwise negative CT abdomen/pelvis. Specifically, no renal, ureteral, or bladder calculi. No hydronephrosis. Electronically Signed   By: Julian Hy M.D.   On: 10/24/2021 20:01    EKG: Independently reviewed.  Sinus tachycardia with rate 117; no acute ischemia   Labs on Admission: I have personally reviewed the available labs and imaging studies at the time of the admission.  Pertinent labs:    BUN 44 -> 36/Creatinine 4.82 -> 2.1/ GFR 15 -> 41; baseline maybe 1.7/50 Lactate 1.8 WBC 29.6 Hgb 19.1 Platelets 428 COVID/flu negative UA: trace Hgb, 15 ketones, small LE, 30 protein, many bacteria, +sulfonamide crystals UDS: +amphetamines, cocaine, THC    Assessment and Plan: * Intractable nausea and vomiting- (present on admission) -Patient with prior extensive GI evaluation and multiple prior admissions for  this condition -May be related to cannabinoid hyperemesis syndrome -Marked leukocytosis -Imaging with concern for appendicitis and so patient was transferred from [facility] for possible surgery and started on abx -His symptoms have completely resolved and he is not having further n/v -Will consult surgery but low suspicion for acute appendicitis and patient may be able to have a diet and advance -Anticipate d/c to home tomorrow -Encourage marijuana cessation and a good bowel regimen as well as behavioral health support  Disorder of appendix- (present on  admission) -As noted above, abnormal imaging with leukocytosis -However, he is completely asymptomatic now and begging for food/drink -No TTP -Low suspicion for acute appendicitis -Unlikely to need surgery -Will defer abx to surgery  Polysubstance abuse ([place])- (present on admission) -UDS + for cocaine (acknowledged), marijuana (acknowledged), amphetamines (denies) -Cessation encouraged; this should be encouraged on an ongoing basis -We discussed that the [facility] may be contributing to/causing his cyclical vomiting and the only way to know is to stop using  Acute kidney failure ([place])- (present on admission) -Baseline stage 3a CKD, marked worsening on admission -He has been hydrated and is already responding -Suspect pre-renal azotemia in the setting of recurrent n/v -Continue to avoid nephrotoxic medications -Recheck BMP in AM  Tobacco abuse- (present on admission) -Encourage cessation.   -This was discussed with the patient and should be reviewed on an ongoing basis.   -Patch ordered at patient request.  HIV (human immunodeficiency virus infection) ([place])- (present on admission) -Continue Dovato -Recent undetectable viral load and good CD4 count -Continue with outpatient ID f/u       Advance Care Planning:   Code Status: Full Code   Consults: Surgery  DVT Prophylaxis: SCDs  Family Communication: None present; he is capable of communicating with family at this time  Severity of Illness: The appropriate patient status for this patient is INPATIENT. Inpatient status is judged to be reasonable and necessary in order to provide the required intensity of service to ensure the patient's safety. The patient's presenting symptoms, physical exam findings, and initial radiographic and laboratory data in the context of their chronic comorbidities is felt to place them at high risk for further clinical deterioration. Furthermore, it is not anticipated that the patient will be medically  stable for discharge from the hospital within 2 midnights of admission.   * I certify that at the point of admission it is my clinical judgment that the patient will require inpatient hospital care spanning beyond 2 midnights from the point of admission due to high intensity of service, high risk for further deterioration and high frequency of surveillance required.*  Author: Karmen Bongo, MD 10/25/2021 6:36 PM  For on call review [email or web address].

## 2021-10-25 NOTE — ED Notes (Signed)
Report given to [facility] 

## 2021-10-25 NOTE — Assessment & Plan Note (Signed)
-  Encourage cessation.   °-This was discussed with the patient and should be reviewed on an ongoing basis.   °-Patch ordered at patient request. °

## 2021-10-26 DIAGNOSIS — R112 Nausea with vomiting, unspecified: Secondary | ICD-10-CM | POA: Diagnosis not present

## 2021-10-26 DIAGNOSIS — R933 Abnormal findings on diagnostic imaging of other parts of digestive tract: Secondary | ICD-10-CM | POA: Diagnosis not present

## 2021-10-26 DIAGNOSIS — R1084 Generalized abdominal pain: Secondary | ICD-10-CM | POA: Diagnosis not present

## 2021-10-26 LAB — BASIC METABOLIC PANEL
Anion gap: 8 (ref 5–15)
BUN: 24 mg/dL — ABNORMAL HIGH (ref 6–20)
CO2: 25 mmol/L (ref 22–32)
Calcium: 9.2 mg/dL (ref 8.9–10.3)
Chloride: 104 mmol/L (ref 98–111)
Creatinine, Ser: 1.45 mg/dL — ABNORMAL HIGH (ref 0.61–1.24)
GFR, Estimated: >60 mL/min (ref >60)
Glucose, Bld: 119 mg/dL — ABNORMAL HIGH (ref 70–99)
Potassium: 3.5 mmol/L (ref 3.5–5.1)
Sodium: 137 mmol/L (ref 135–145)

## 2021-10-26 LAB — CBC
HCT: 41.3 % (ref 39.0–52.0)
Hemoglobin: 14.1 g/dL (ref 13.0–17.0)
MCH: 31 pg (ref 26.0–34.0)
MCHC: 34.1 g/dL (ref 30.0–36.0)
MCV: 90.8 fL (ref 80.0–100.0)
Platelets: 275 10*3/uL (ref 150–400)
RBC: 4.55 MIL/uL (ref 4.22–5.81)
RDW: 13.4 % (ref 11.5–15.5)
WBC: 11.7 10*3/uL — ABNORMAL HIGH (ref 4.0–10.5)
nRBC: 0 % (ref 0.0–0.2)

## 2021-10-26 MED ORDER — OXYCODONE HCL 5 MG PO TABS: 5.0000 mg | ORAL_TABLET | ORAL | Status: DC | PRN

## 2021-10-26 MED ORDER — MORPHINE SULFATE (PF) 2 MG/ML IV SOLN: 2.0000 mg | INTRAVENOUS | Status: DC | PRN

## 2021-10-26 NOTE — Discharge Summary (Signed)
Carlos Guzman [id number] DOB: 05-01-1986 DOA: 10/24/2021  PCP: Patient, No Pcp Per (Inactive)  Admit date: 10/24/2021 Discharge date: 10/26/2021  Admitted From: home Disposition:  home  Recommendations for Outpatient Follow-up:  Follow up with PCP in 1 week Please obtain BMP/CBC in one week     Discharge Condition:Stable CODE STATUS:Full  Diet recommendation: Regular    Brief/Interim Summary: Per [id number] Carlos Guzman is a 36 y.o. male with medical history significant of cannabinoid hyperemesis syndrome; HIV; and polysubstance abuse presenting with n/v.  He reports that started with n/v a couple of days ago.  This was similar to prior episodes but not as bad.  No fever.  +periumbilical abdominal pain, resolved.  No further n/v since last night.  Last cocaine was 2-3 days ago, THC yesterday.  He wasn't able to eat or drink for a couple of days.  Patient found with AKI, intractable nausea and vomiting, and CT scan that was concerning for acute appendicitis.  Surgery was consulted.  They did not feel there was any clinical evidence of appendicitis.  Patient's symptoms have resolved, normalizing WBC, no fevers and tolerating regular diet.  They recommended discharging antibiotics.  No surgical intervention at this time.  Patient is feeling better this AM.  No further nausea or vomiting.  No abdominal pain.  Stable for discharge.   Intractable nausea and vomiting- (present on admission) -Patient with prior extensive GI evaluation and multiple prior admissions for this condition -May be related to cannabinoid hyperemesis syndrome Improved tolerated p.o. intake   Disorder of appendix- (present on admission) CT scan with dilated, fluid-filled appendix, measuring up to 10 mm in diameter, without convincing periappendiceal inflammatory changes but abnormal. Correlate for signs/symptoms of early acute appendicitis.  Otherwise negative CT abdomen/pelvis. Specifically, no renal, ureteral, or  bladder calculi. No hydronephrosis.   Surgery was consulted.  No concern for appendicitis.  IV antibiotics was discontinued.   Polysubstance abuse ([place])- (present on admission) -UDS + for cocaine (acknowledged), marijuana (acknowledged), amphetamines (denies) Counseled about drug cessation and complications of ongoing drug use.   Acute kidney failure  on CKD stage 3a(HCC)- (present on admission) Likely prerenal due to GI symptoms. Improving with IV fluids Encourage continue hydration at home 4.82>>>>1.45    Tobacco abuse- (present on admission) -Encourage cessation.      HIV (human immunodeficiency virus infection) ([place])- (present on admission) -Continue Dovato              Discharge Diagnoses:  Principal Problem:   Intractable nausea and vomiting Active Problems:   HIV (human immunodeficiency virus infection) ([place])   Tobacco abuse   Acute kidney failure ([place])   Polysubstance abuse ([place])   Disorder of appendix    Discharge Instructions  Discharge Instructions     Discharge instructions   Complete by: As directed    Please stop all drug use Hydrate with water, hydrate well!   Increase activity slowly   Complete by: As directed       Allergies as of 10/26/2021   No Known Allergies      Medication List     STOP taking these medications    dicyclomine 20 MG tablet Commonly known as: BENTYL       TAKE these medications    Dovato 50-300 MG tablet Generic drug: dolutegravir-lamiVUDine Take 1 tablet by mouth daily.   omeprazole 40 MG capsule Commonly known as: PRILOSEC Take 40 mg by mouth daily.        No Known Allergies  Consultations: Surgery  Procedures/Studies: DG Abd Portable 1 View  Result Date: 10/24/2021 CLINICAL DATA:  Left flank pain, nausea and vomiting for 3 days EXAM: PORTABLE ABDOMEN - 1 VIEW COMPARISON:  07/27/2014, 09/06/2021 FINDINGS: Supine frontal view of the abdomen and pelvis excludes the hemidiaphragms by  collimation. No bowel obstruction or ileus. There are no masses or abnormal calcifications. Stable vascular calcifications within the lower pelvis. No acute bony abnormalities. IMPRESSION: 1. No radiopaque urinary tract calculi. 2. Unremarkable bowel gas pattern. Electronically Signed   By: Randa Ngo M.D.   On: 10/24/2021 17:53   CT Renal Stone Study  Result Date: 10/24/2021 CLINICAL DATA:  Flank pain, kidney stone EXAM: CT ABDOMEN AND PELVIS WITHOUT CONTRAST TECHNIQUE: Multidetector CT imaging of the abdomen and pelvis was performed following the standard protocol without IV contrast. RADIATION DOSE REDUCTION: This exam was performed according to the departmental dose-optimization program which includes automated exposure control, adjustment of the mA and/or kV according to patient size and/or use of iterative reconstruction technique. COMPARISON:  09/06/2021 FINDINGS: Lower chest: Lung bases are clear. Hepatobiliary: Unenhanced liver is unremarkable. Gallbladder is unremarkable. No intrahepatic or extrahepatic ductal dilatation. Pancreas: Within normal limits. Spleen: Within normal limits. Adrenals/Urinary Tract: Adrenal glands are within normal limits. Kidneys are within normal limits. No renal, ureteral, or bladder calculi. No hydronephrosis. Bladder is within normal limits. Stomach/Bowel: Stomach is within normal limits. No evidence of bowel obstruction. Mildly prominent, fluid-filled appendix in the right lower quadrant, measuring up to 10 mm in diameter (series 2/image 66), without convincing periappendiceal inflammatory changes but abnormal. No colonic wall thickening or inflammatory changes. Vascular/Lymphatic: No evidence of abdominal aortic aneurysm. No suspicious abdominopelvic lymphadenopathy. Reproductive: Prostate is unremarkable. Other: No abdominopelvic ascites. Musculoskeletal: Visualized osseous structures are within normal limits. IMPRESSION: Dilated, fluid-filled appendix, measuring up  to 10 mm in diameter, without convincing periappendiceal inflammatory changes but abnormal. Correlate for signs/symptoms of early acute appendicitis. Otherwise negative CT abdomen/pelvis. Specifically, no renal, ureteral, or bladder calculi. No hydronephrosis. Electronically Signed   By: Julian Hy M.D.   On: 10/24/2021 20:01      Subjective: No nausea, vomiting, or abdominal pain.  Feels much better.  Discharge Exam: Vitals:   10/26/21 0459 10/26/21 0856  BP: 118/74 125/76  Pulse: (!) 56 67  Resp: 18 18  Temp: 98.3 F (36.8 C) 98.1 F (36.7 C)  SpO2: 99% 100%   Vitals:   10/25/21 2217 10/26/21 0100 10/26/21 0459 10/26/21 0856  BP: 127/78  118/74 125/76  Pulse: 75  (!) 56 67  Resp: 18  18 18   Temp:  98.7 F (37.1 C) 98.3 F (36.8 C) 98.1 F (36.7 C)  TempSrc:  Oral Oral Oral  SpO2: 99%  99% 100%  Weight:   85.2 kg   Height:        General: Pt is alert, awake, not in acute distress Cardiovascular: RRR, S1/S2 +, no rubs, no gallops Respiratory: CTA bilaterally, no wheezing, no rhonchi Abdominal: Soft, NT, ND, bowel sounds + Extremities: no edema, no cyanosis    The results of significant diagnostics from this hospitalization (including imaging, microbiology, ancillary and laboratory) are listed below for reference.     Microbiology: Recent Results (from the past 240 hour(s))  Resp Panel by RT-PCR (Flu A&B, Covid) Nasopharyngeal Swab     Status: None   Collection Time: 10/24/21  7:57 PM   Specimen: Nasopharyngeal Swab; Nasopharyngeal(NP) swabs in vial transport medium  Result Value Ref Range Status   SARS Coronavirus 2 by RT  PCR NEGATIVE NEGATIVE Final    Comment: (NOTE) SARS-CoV-2 target nucleic acids are NOT DETECTED.  The SARS-CoV-2 RNA is generally detectable in upper respiratory specimens during the acute phase of infection. The lowest concentration of SARS-CoV-2 viral copies this assay can detect is 138 copies/mL. A negative result does not preclude  SARS-Cov-2 infection and should not be used as the sole basis for treatment or other patient management decisions. A negative result may occur with  improper specimen collection/handling, submission of specimen other than nasopharyngeal swab, presence of viral mutation(s) within the areas targeted by this assay, and inadequate number of viral copies(<138 copies/mL). A negative result must be combined with clinical observations, patient history, and epidemiological information. The expected result is Negative.  Fact Sheet for Patients:  EntrepreneurPulse.com.au  Fact Sheet for Healthcare Providers:  IncredibleEmployment.be  This test is no t yet approved or cleared by the [place] FDA and  has been authorized for detection and/or diagnosis of SARS-CoV-2 by FDA under an Emergency Use Authorization (EUA). This EUA will remain  in effect (meaning this test can be used) for the duration of the COVID-19 declaration under Section 564(b)(1) of the Act, 21 U.S.C.section 360bbb-3(b)(1), unless the authorization is terminated  or revoked sooner.       Influenza A by PCR NEGATIVE NEGATIVE Final   Influenza B by PCR NEGATIVE NEGATIVE Final    Comment: (NOTE) The Xpert Xpress SARS-CoV-2/FLU/RSV plus assay is intended as an aid in the diagnosis of influenza from Nasopharyngeal swab specimens and should not be used as a sole basis for treatment. Nasal washings and aspirates are unacceptable for Xpert Xpress SARS-CoV-2/FLU/RSV testing.  Fact Sheet for Patients: EntrepreneurPulse.com.au  Fact Sheet for Healthcare Providers: IncredibleEmployment.be  This test is not yet approved or cleared by the [place] FDA and has been authorized for detection and/or diagnosis of SARS-CoV-2 by FDA under an Emergency Use Authorization (EUA). This EUA will remain in effect (meaning this test can be used) for the duration of  the COVID-19 declaration under Section 564(b)(1) of the Act, 21 U.S.C. section 360bbb-3(b)(1), unless the authorization is terminated or revoked.  Performed at [facility], [address] [place], [place] [address]   Blood culture (routine x 2)     Status: None (Preliminary result)   Collection Time: 10/24/21  8:02 PM   Specimen: BLOOD  Result Value Ref Range Status   Specimen Description   Final    BLOOD RIGHT ANTECUBITAL Performed at [facility], [address] [place], [place] [address]    Special Requests   Final    BOTTLES DRAWN AEROBIC AND ANAEROBIC Blood Culture adequate volume Performed at [facility], [address] [place], [place] [address]    Culture   Final    NO GROWTH 2 DAYS Performed at [facility], [place] [address] [place], [place] [address]    Report Status PENDING  Incomplete  Blood culture (routine x 2)     Status: None (Preliminary result)   Collection Time: 10/24/21  8:10 PM   Specimen: BLOOD  Result Value Ref Range Status   Specimen Description   Final    BLOOD LEFT ANTECUBITAL Performed at [facility], [address] [place], [place] [address]    Special Requests   Final    BOTTLES DRAWN AEROBIC AND ANAEROBIC Blood Culture adequate volume Performed at [facility], [address] [place], [place]  [address]    Culture   Final    NO GROWTH 2 DAYS Performed at [facility], [place] [address], [place], [place] [address]    Report Status PENDING  Incomplete     Labs: BNP (last 3 results) No results for input(s): BNP in the last 8760 hours. Basic Metabolic Panel: Recent Labs  Lab 10/24/21 1758 10/25/21 0718 10/26/21 0437  NA 133* 137 137  K 4.4 4.0 3.5  CL 91* 105 104  CO2 19* 24 25  GLUCOSE 155* 114* 119*  BUN 44* 36* 24*  CREATININE 4.82* 2.10* 1.45*  CALCIUM 11.0* 8.9 9.2   Liver Function Tests: Recent Labs  Lab 10/24/21 1758  AST 20  ALT 17  ALKPHOS 66   BILITOT 0.8  PROT 10.4*  ALBUMIN 5.8*   Recent Labs  Lab 10/24/21 1758  LIPASE 30   No results for input(s): AMMONIA in the last 168 hours. CBC: Recent Labs  Lab 10/24/21 1758 10/25/21 1304 10/26/21 0437  WBC 29.6* 15.7* 11.7*  NEUTROABS 22.5*  --   --   HGB 19.1* 14.7 14.1  HCT 53.8* 44.8 41.3  MCV 87.9 91.8 90.8  PLT 428* 311 275   Cardiac Enzymes: No results for input(s): CKTOTAL, CKMB, CKMBINDEX, TROPONINI in the last 168 hours. BNP: Invalid input(s): POCBNP CBG: No results for input(s): GLUCAP in the last 168 hours. D-Dimer No results for input(s): DDIMER in the last 72 hours. Hgb A1c No results for input(s): HGBA1C in the last 72 hours. Lipid Profile No results for input(s): CHOL, HDL, LDLCALC, TRIG, CHOLHDL, LDLDIRECT in the last 72 hours. Thyroid function studies No results for input(s): TSH, T4TOTAL, T3FREE, THYROIDAB in the last 72 hours.  Invalid input(s): FREET3 Anemia work up No results for input(s): VITAMINB12, FOLATE, FERRITIN, TIBC, IRON, RETICCTPCT in the last 72 hours. Urinalysis    Component Value Date/Time   COLORURINE YELLOW 10/25/2021 0255   APPEARANCEUR HAZY (A) 10/25/2021 0255   APPEARANCEUR Clear 11/24/2013 1333   LABSPEC 1.025 10/25/2021 0255   LABSPEC 1.030 11/24/2013 1333   PHURINE 5.5 10/25/2021 0255   GLUCOSEU NEGATIVE 10/25/2021 0255   GLUCOSEU Negative 11/24/2013 1333   HGBUR TRACE (A) 10/25/2021 0255   BILIRUBINUR NEGATIVE 10/25/2021 0255   BILIRUBINUR 1+ 11/24/2013 1333   KETONESUR 15 (A) 10/25/2021 0255   PROTEINUR 30 (A) 10/25/2021 0255   UROBILINOGEN 1.0 01/04/2015 1651   NITRITE NEGATIVE 10/25/2021 0255   LEUKOCYTESUR SMALL (A) 10/25/2021 0255   LEUKOCYTESUR Negative 11/24/2013 1333   Sepsis Labs Invalid input(s): PROCALCITONIN,  WBC,  LACTICIDVEN Microbiology Recent Results (from the past 240 hour(s))  Resp Panel by RT-PCR (Flu A&B, Covid) Nasopharyngeal Swab     Status: None   Collection Time: 10/24/21  7:57  PM   Specimen: Nasopharyngeal Swab; Nasopharyngeal(NP) swabs in vial transport medium  Result Value Ref Range Status   SARS Coronavirus 2 by RT PCR NEGATIVE NEGATIVE Final    Comment: (NOTE) SARS-CoV-2 target nucleic acids are NOT DETECTED.  The SARS-CoV-2 RNA is generally detectable in upper respiratory specimens during the acute phase of infection. The lowest concentration of SARS-CoV-2 viral copies this assay can detect is 138 copies/mL. A negative result does not preclude SARS-Cov-2 infection and should not be used as the sole basis for treatment or other patient management decisions. A negative result may occur with  improper specimen collection/handling, submission of specimen other than nasopharyngeal swab, presence of viral mutation(s) within the areas targeted by this assay, and inadequate number  of viral copies(<138 copies/mL). A negative result must be combined with clinical observations, patient history, and epidemiological information. The expected result is Negative.  Fact Sheet for Patients:  EntrepreneurPulse.com.au  Fact Sheet for Healthcare Providers:  IncredibleEmployment.be  This test is no t yet approved or cleared by the [place] FDA and  has been authorized for detection and/or diagnosis of SARS-CoV-2 by FDA under an Emergency Use Authorization (EUA). This EUA will remain  in effect (meaning this test can be used) for the duration of the COVID-19 declaration under Section 564(b)(1) of the Act, 21 U.S.C.section 360bbb-3(b)(1), unless the authorization is terminated  or revoked sooner.       Influenza A by PCR NEGATIVE NEGATIVE Final   Influenza B by PCR NEGATIVE NEGATIVE Final    Comment: (NOTE) The Xpert Xpress SARS-CoV-2/FLU/RSV plus assay is intended as an aid in the diagnosis of influenza from Nasopharyngeal swab specimens and should not be used as a sole basis for treatment. Nasal washings and aspirates are  unacceptable for Xpert Xpress SARS-CoV-2/FLU/RSV testing.  Fact Sheet for Patients: EntrepreneurPulse.com.au  Fact Sheet for Healthcare Providers: IncredibleEmployment.be  This test is not yet approved or cleared by the [place] FDA and has been authorized for detection and/or diagnosis of SARS-CoV-2 by FDA under an Emergency Use Authorization (EUA). This EUA will remain in effect (meaning this test can be used) for the duration of the COVID-19 declaration under Section 564(b)(1) of the Act, 21 U.S.C. section 360bbb-3(b)(1), unless the authorization is terminated or revoked.  Performed at [facility], [address] [place], [place] [address]   Blood culture (routine x 2)     Status: None (Preliminary result)   Collection Time: 10/24/21  8:02 PM   Specimen: BLOOD  Result Value Ref Range Status   Specimen Description   Final    BLOOD RIGHT ANTECUBITAL Performed at [facility], [address] [place], [place] [address]    Special Requests   Final    BOTTLES DRAWN AEROBIC AND ANAEROBIC Blood Culture adequate volume Performed at [facility], [address] [place], [place] [address]    Culture   Final    NO GROWTH 2 DAYS Performed at [facility], [place] [address], [place], [place] [address]    Report Status PENDING  Incomplete  Blood culture (routine x 2)     Status: None (Preliminary result)   Collection Time: 10/24/21  8:10 PM   Specimen: BLOOD  Result Value Ref Range Status   Specimen Description   Final    BLOOD LEFT ANTECUBITAL Performed at [facility], [address] [place], [place] [address]    Special Requests   Final    BOTTLES DRAWN AEROBIC AND ANAEROBIC Blood Culture adequate volume Performed at [facility], [address] [place], [place] [address]    Culture   Final    NO GROWTH 2 DAYS Performed at [facility], [place] [address],  [place], [place] [address]    Report Status PENDING  Incomplete     Time coordinating discharge: Over 30 minutes  SIGNED:   Nolberto Hanlon, MD  [facility] 10/26/2021, 10:47 AM Pager   If 7PM-7AM, please contact night-coverage [email or web address] Password [email or web address]

## 2021-10-26 NOTE — Plan of Care (Signed)
Problem: Education: °Goal: Knowledge of General Education information will improve °Description: Including pain rating scale, medication(s)/side effects and non-pharmacologic comfort measures °Outcome: Completed/Met °  °

## 2021-10-26 NOTE — Progress Notes (Signed)
Subjective: No complaints.  Ate a hamburger and peaches last pm with no issues.  No n/v.  No abdominal pain.  Wants to go home  ROS: See above, otherwise other systems negative  Objective: Vital signs in last 24 hours: Temp:  [98 F (36.7 C)-98.7 F (37.1 C)] 98.3 F (36.8 C) (02/17 0459) Pulse Rate:  [56-81] 56 (02/17 0459) Resp:  [18] 18 (02/17 0459) BP: (111-128)/(66-80) 118/74 (02/17 0459) SpO2:  [99 %-100 %] 99 % (02/17 0459) Weight:  [85.2 kg] 85.2 kg (02/17 0459) Last BM Date : 10/21/21  Intake/Output from previous day: 02/16 0701 - 02/17 0700 In: 4227.2 [P.O.:1680; I.V.:2242.7; IV Piggyback:186.5] Out: 0  Intake/Output this shift: No intake/output data recorded.  PE: Gen: NAD Abd: soft, NT, Nd, +BS  Lab Results:  Recent Labs    10/25/21 1304 10/26/21 0437  WBC 15.7* 11.7*  HGB 14.7 14.1  HCT 44.8 41.3  PLT 311 275   BMET Recent Labs    10/25/21 0718 10/26/21 0437  NA 137 137  K 4.0 3.5  CL 105 104  CO2 24 25  GLUCOSE 114* 119*  BUN 36* 24*  CREATININE 2.10* 1.45*  CALCIUM 8.9 9.2   PT/INR No results for input(s): LABPROT, INR in the last 72 hours. CMP     Component Value Date/Time   NA 137 10/26/2021 0437   NA 137 11/24/2013 1333   K 3.5 10/26/2021 0437   K 3.7 11/24/2013 1333   CL 104 10/26/2021 0437   CL 102 11/24/2013 1333   CO2 25 10/26/2021 0437   CO2 25 11/24/2013 1333   GLUCOSE 119 (H) 10/26/2021 0437   GLUCOSE 101 (H) 11/24/2013 1333   BUN 24 (H) 10/26/2021 0437   BUN 8 11/24/2013 1333   CREATININE 1.45 (H) 10/26/2021 0437   CREATININE 1.31 (H) 11/24/2013 1333   CALCIUM 9.2 10/26/2021 0437   CALCIUM 9.6 11/24/2013 1333   PROT 10.4 (H) 10/24/2021 1758   PROT 9.4 (H) 11/24/2013 1333   ALBUMIN 5.8 (H) 10/24/2021 1758   ALBUMIN 4.2 11/24/2013 1333   AST 20 10/24/2021 1758   AST 135 (H) 11/24/2013 1333   ALT 17 10/24/2021 1758   ALT 120 (H) 11/24/2013 1333   ALKPHOS 66 10/24/2021 1758   ALKPHOS 76 11/24/2013 1333    BILITOT 0.8 10/24/2021 1758   BILITOT 0.5 11/24/2013 1333   GFRNONAA >60 10/26/2021 0437   GFRNONAA >60 11/24/2013 1333   GFRAA >60 03/24/2020 1737   GFRAA >60 11/24/2013 1333   Lipase     Component Value Date/Time   LIPASE 30 10/24/2021 1758   LIPASE 89 11/24/2013 1333       Studies/Results: DG Abd Portable 1 View  Result Date: 10/24/2021 CLINICAL DATA:  Left flank pain, nausea and vomiting for 3 days EXAM: PORTABLE ABDOMEN - 1 VIEW COMPARISON:  07/27/2014, 09/06/2021 FINDINGS: Supine frontal view of the abdomen and pelvis excludes the hemidiaphragms by collimation. No bowel obstruction or ileus. There are no masses or abnormal calcifications. Stable vascular calcifications within the lower pelvis. No acute bony abnormalities. IMPRESSION: 1. No radiopaque urinary tract calculi. 2. Unremarkable bowel gas pattern. Electronically Signed   By: Randa Ngo M.D.   On: 10/24/2021 17:53   CT Renal Stone Study  Result Date: 10/24/2021 CLINICAL DATA:  Flank pain, kidney stone EXAM: CT ABDOMEN AND PELVIS WITHOUT CONTRAST TECHNIQUE: Multidetector CT imaging of the abdomen and pelvis was performed following the standard protocol without IV contrast.  RADIATION DOSE REDUCTION: This exam was performed according to the departmental dose-optimization program which includes automated exposure control, adjustment of the mA and/or kV according to patient size and/or use of iterative reconstruction technique. COMPARISON:  09/06/2021 FINDINGS: Lower chest: Lung bases are clear. Hepatobiliary: Unenhanced liver is unremarkable. Gallbladder is unremarkable. No intrahepatic or extrahepatic ductal dilatation. Pancreas: Within normal limits. Spleen: Within normal limits. Adrenals/Urinary Tract: Adrenal glands are within normal limits. Kidneys are within normal limits. No renal, ureteral, or bladder calculi. No hydronephrosis. Bladder is within normal limits. Stomach/Bowel: Stomach is within normal limits. No  evidence of bowel obstruction. Mildly prominent, fluid-filled appendix in the right lower quadrant, measuring up to 10 mm in diameter (series 2/image 66), without convincing periappendiceal inflammatory changes but abnormal. No colonic wall thickening or inflammatory changes. Vascular/Lymphatic: No evidence of abdominal aortic aneurysm. No suspicious abdominopelvic lymphadenopathy. Reproductive: Prostate is unremarkable. Other: No abdominopelvic ascites. Musculoskeletal: Visualized osseous structures are within normal limits. IMPRESSION: Dilated, fluid-filled appendix, measuring up to 10 mm in diameter, without convincing periappendiceal inflammatory changes but abnormal. Correlate for signs/symptoms of early acute appendicitis. Otherwise negative CT abdomen/pelvis. Specifically, no renal, ureteral, or bladder calculi. No hydronephrosis. Electronically Signed   By: Julian Hy M.D.   On: 10/24/2021 20:01    Anti-infectives: Anti-infectives (From admission, onward)    Start     Dose/Rate Route Frequency Ordered Stop   10/25/21 1645  dolutegravir (TIVICAY) tablet 50 mg       See Hyperspace for full Linked Orders Report.   50 mg Oral Daily 10/25/21 1547     10/25/21 1645  lamiVUDine (EPIVIR) tablet 300 mg       See Hyperspace for full Linked Orders Report.   300 mg Oral Daily 10/25/21 1547     10/25/21 1330  dolutegravir-lamiVUDine (DOVATO) 50-300 MG per tablet 1 tablet  Status:  Discontinued        1 tablet Oral Daily 10/25/21 1242 10/25/21 1549   10/24/21 2200  piperacillin-tazobactam (ZOSYN) IVPB 3.375 g        3.375 g 12.5 mL/hr over 240 Minutes Intravenous Every 8 hours 10/24/21 2023          Assessment/Plan Abdominal pain, N/V -no clinical evidence for appendicitis -all above symptoms have resolved with a normalizing WBC, no fevers, and tolerating a regular diet -may Dc abx therapy -no plans for surgical intervention, we will sign off.   FEN - regular diet VTE - per  medicine ID - zosyn, may stop  Straightforward Medical Decision Making  LOS: 1 day    Henreitta Cea , [facility] 10/26/2021, 7:56 AM Please see Amion for pager number during day hours 7:00am-4:30pm or 7:00am -11:30am on weekends

## 2021-10-26 NOTE — Progress Notes (Signed)
°  Transition of Care [facility]) Screening Note   Patient Details  Name: Lleyton Byers Date of Birth: [birth year]   Transition of Care [facility]) CM/SW Contact:    Tom-Johnson, Hershal Coria, RN Phone Number: 10/26/2021, 1:19 PM  Patient is admitted for Intractable nausea and vomiting. Patient positive for Amphetamines, Cocaine and tetrahydrocannabinol. SW to do substance abuse education. Patient does not have a PCP. Requested hospital followup be scheduled with [facility]. CM scheduled f/u appointment with [facility] and information on AVS. Patient does not have insurance and MATCH not done d/t no medication needs. Transition of Care Department [facility]) has reviewed patient and no TOC needs or recommendations have been identified at this time. Patient is scheduled for discharge today. Family to transport at discharge. No further TOC needs noted.

## 2021-10-26 NOTE — Progress Notes (Signed)
PT AVS reviewed and pt verbalized understanding of all DC teaching and instructions. IV removed without complications. PT going to DC lounge to wait for ride. He is stable and ambulatory and on RA.

## 2021-10-29 LAB — CULTURE, BLOOD (ROUTINE X 2)
Culture: NO GROWTH
Culture: NO GROWTH
Special Requests: ADEQUATE
Special Requests: ADEQUATE

## 2021-11-28 ENCOUNTER — Other Ambulatory Visit: Payer: Self-pay

## 2021-11-28 ENCOUNTER — Inpatient Hospital Stay: Payer: Medicaid Other | Admitting: Nurse Practitioner

## 2021-11-28 ENCOUNTER — Observation Stay (HOSPITAL_BASED_OUTPATIENT_CLINIC_OR_DEPARTMENT_OTHER)
Admission: EM | Admit: 2021-11-28 | Discharge: 2021-11-29 | Disposition: A | Discharge status: Home / Self Care | Payer: Medicaid Other | Attending: Internal Medicine | Admitting: Internal Medicine

## 2021-11-28 ENCOUNTER — Encounter (HOSPITAL_BASED_OUTPATIENT_CLINIC_OR_DEPARTMENT_OTHER): Payer: Self-pay | Admitting: Emergency Medicine

## 2021-11-28 DIAGNOSIS — F12988 Cannabis use, unspecified with other cannabis-induced disorder: Secondary | ICD-10-CM | POA: Diagnosis not present

## 2021-11-28 DIAGNOSIS — B20 Human immunodeficiency virus [HIV] disease: Secondary | ICD-10-CM | POA: Insufficient documentation

## 2021-11-28 DIAGNOSIS — J45909 Unspecified asthma, uncomplicated: Secondary | ICD-10-CM | POA: Diagnosis not present

## 2021-11-28 DIAGNOSIS — N179 Acute kidney failure, unspecified: Principal | ICD-10-CM | POA: Diagnosis present

## 2021-11-28 DIAGNOSIS — R112 Nausea with vomiting, unspecified: Secondary | ICD-10-CM | POA: Diagnosis present

## 2021-11-28 DIAGNOSIS — F17210 Nicotine dependence, cigarettes, uncomplicated: Secondary | ICD-10-CM | POA: Insufficient documentation

## 2021-11-28 DIAGNOSIS — Z21 Asymptomatic human immunodeficiency virus [HIV] infection status: Secondary | ICD-10-CM | POA: Diagnosis present

## 2021-11-28 DIAGNOSIS — N1831 Chronic kidney disease, stage 3a: Secondary | ICD-10-CM | POA: Insufficient documentation

## 2021-11-28 DIAGNOSIS — N183 Chronic kidney disease, stage 3 unspecified: Secondary | ICD-10-CM | POA: Diagnosis present

## 2021-11-28 DIAGNOSIS — F1290 Cannabis use, unspecified, uncomplicated: Secondary | ICD-10-CM

## 2021-11-28 DIAGNOSIS — E860 Dehydration: Secondary | ICD-10-CM | POA: Diagnosis not present

## 2021-11-28 DIAGNOSIS — D72829 Elevated white blood cell count, unspecified: Secondary | ICD-10-CM | POA: Diagnosis present

## 2021-11-28 DIAGNOSIS — F1410 Cocaine abuse, uncomplicated: Secondary | ICD-10-CM | POA: Diagnosis present

## 2021-11-28 LAB — COMPREHENSIVE METABOLIC PANEL
ALT: 22 U/L (ref 0–44)
AST: 27 U/L (ref 15–41)
Albumin: 5.5 g/dL — ABNORMAL HIGH (ref 3.5–5.0)
Alkaline Phosphatase: 73 U/L (ref 38–126)
Anion gap: 18 — ABNORMAL HIGH (ref 5–15)
BUN: 26 mg/dL — ABNORMAL HIGH (ref 6–20)
CO2: 21 mmol/L — ABNORMAL LOW (ref 22–32)
Calcium: 10.9 mg/dL — ABNORMAL HIGH (ref 8.9–10.3)
Chloride: 97 mmol/L — ABNORMAL LOW (ref 98–111)
Creatinine, Ser: 3.25 mg/dL — ABNORMAL HIGH (ref 0.61–1.24)
GFR, Estimated: 24 mL/min — ABNORMAL LOW (ref >60)
Glucose, Bld: 186 mg/dL — ABNORMAL HIGH (ref 70–99)
Potassium: 4.4 mmol/L (ref 3.5–5.1)
Sodium: 136 mmol/L (ref 135–145)
Total Bilirubin: 0.7 mg/dL (ref 0.3–1.2)
Total Protein: 10.8 g/dL — ABNORMAL HIGH (ref 6.5–8.1)

## 2021-11-28 LAB — ETHANOL: Alcohol, Ethyl (B): <10 mg/dL (ref <10)

## 2021-11-28 LAB — CBC WITH DIFFERENTIAL/PLATELET
Abs Immature Granulocytes: 0.31 10*3/uL — ABNORMAL HIGH (ref 0.00–0.07)
Basophils Absolute: 0.1 10*3/uL (ref 0.0–0.1)
Basophils Relative: 1 %
Eosinophils Absolute: 0 10*3/uL (ref 0.0–0.5)
Eosinophils Relative: 0 %
HCT: 54.1 % — ABNORMAL HIGH (ref 39.0–52.0)
Hemoglobin: 18.4 g/dL — ABNORMAL HIGH (ref 13.0–17.0)
Immature Granulocytes: 1 %
Lymphocytes Relative: 12 %
Lymphs Abs: 2.6 10*3/uL (ref 0.7–4.0)
MCH: 30.2 pg (ref 26.0–34.0)
MCHC: 34 g/dL (ref 30.0–36.0)
MCV: 88.7 fL (ref 80.0–100.0)
Monocytes Absolute: 0.6 10*3/uL (ref 0.1–1.0)
Monocytes Relative: 3 %
Neutro Abs: 18.7 10*3/uL — ABNORMAL HIGH (ref 1.7–7.7)
Neutrophils Relative %: 83 %
Platelets: 380 10*3/uL (ref 150–400)
RBC: 6.1 MIL/uL — ABNORMAL HIGH (ref 4.22–5.81)
RDW: 12.6 % (ref 11.5–15.5)
WBC: 22.2 10*3/uL — ABNORMAL HIGH (ref 4.0–10.5)
nRBC: 0 % (ref 0.0–0.2)

## 2021-11-28 LAB — URINALYSIS, MICROSCOPIC (REFLEX)

## 2021-11-28 LAB — URINALYSIS, ROUTINE W REFLEX MICROSCOPIC
Glucose, UA: NEGATIVE mg/dL
Hgb urine dipstick: NEGATIVE
Ketones, ur: 40 mg/dL — AB
Nitrite: NEGATIVE
Protein, ur: 100 mg/dL — AB
Specific Gravity, Urine: >1.03 — ABNORMAL HIGH (ref 1.005–1.030)
pH: 5.5 (ref 5.0–8.0)

## 2021-11-28 LAB — RAPID URINE DRUG SCREEN, HOSP PERFORMED
Amphetamines: NOT DETECTED
Barbiturates: NOT DETECTED
Benzodiazepines: NOT DETECTED
Cocaine: POSITIVE — AB
Opiates: NOT DETECTED
Tetrahydrocannabinol: POSITIVE — AB

## 2021-11-28 MED ORDER — SODIUM CHLORIDE 0.9 % IV BOLUS
1000.0000 mL | Freq: Once | INTRAVENOUS | Status: AC
  Administered 2021-11-28: 1000 mL via INTRAVENOUS

## 2021-11-28 MED ORDER — ACETAMINOPHEN 650 MG RE SUPP: 650.0000 mg | Freq: Four times a day (QID) | RECTAL | Status: DC | PRN

## 2021-11-28 MED ORDER — SODIUM CHLORIDE 0.9 % IV SOLN: INTRAVENOUS | Status: DC

## 2021-11-28 MED ORDER — PANTOPRAZOLE SODIUM 40 MG IV SOLR
40.0000 mg | Freq: Two times a day (BID) | INTRAVENOUS | Status: DC
  Administered 2021-11-28 – 2021-11-29 (×2): 40 mg via INTRAVENOUS
  Filled 2021-11-28 (×2): qty 10

## 2021-11-28 MED ORDER — ONDANSETRON HCL 4 MG PO TABS: 4.0000 mg | ORAL_TABLET | Freq: Four times a day (QID) | ORAL | Status: DC | PRN

## 2021-11-28 MED ORDER — FENTANYL CITRATE PF 50 MCG/ML IJ SOSY
50.0000 ug | PREFILLED_SYRINGE | Freq: Once | INTRAMUSCULAR | Status: AC
  Administered 2021-11-28: 50 ug via INTRAVENOUS
  Filled 2021-11-28: qty 1

## 2021-11-28 MED ORDER — METOCLOPRAMIDE HCL 5 MG/ML IJ SOLN
10.0000 mg | Freq: Once | INTRAMUSCULAR | Status: AC
  Administered 2021-11-28: 10 mg via INTRAVENOUS
  Filled 2021-11-28: qty 2

## 2021-11-28 MED ORDER — ONDANSETRON HCL 4 MG/2ML IJ SOLN: 4.0000 mg | Freq: Four times a day (QID) | INTRAMUSCULAR | Status: DC | PRN

## 2021-11-28 MED ORDER — DOLUTEGRAVIR-LAMIVUDINE 50-300 MG PO TABS
1.0000 | ORAL_TABLET | Freq: Every day | ORAL | Status: DC
  Filled 2021-11-28: qty 1

## 2021-11-28 MED ORDER — HALOPERIDOL LACTATE 5 MG/ML IJ SOLN
5.0000 mg | Freq: Once | INTRAMUSCULAR | Status: AC
  Administered 2021-11-28: 5 mg via INTRAVENOUS
  Filled 2021-11-28: qty 1

## 2021-11-28 MED ORDER — ACETAMINOPHEN 325 MG PO TABS: 650.0000 mg | ORAL_TABLET | Freq: Four times a day (QID) | ORAL | Status: DC | PRN

## 2021-11-28 NOTE — H&P (Signed)
?History and Physical  ? ? ?Carlos Guzman [id number] DOB: Sep 01, 1986 DOA: 11/28/2021 ? ?PCP: Patient, No Pcp Per (Inactive)  ? ?Patient coming from: Home ? ?I have personally briefly reviewed patient's old medical records in [place] ? ?Chief Complaint: Nausea and vomiting ? ?HPI: Carlos Guzman is a 36 y.o. male with medical history significant of HIV, chronic kidney disease stage IIIa, cannabinoid hyperemesis syndrome with recurrent admissions for intractable nausea and vomiting with last admission from 10/25/2021-10/26/2021, polysubstance abuse presented with progressively worsening nausea and vomiting along with some abdominal pain.  He states that his symptoms started suddenly yesterday and has been progressively worsening with multiple episodes of nausea and vomiting.  Denies any fever, chest pain, shortness of breath, dysuria, increased frequency of urination, diarrhea, loss of consciousness or seizures.  He has not been able to eat anything.  His symptoms are similar to prior admission. ? ?ED Course: He was found to have creatinine of 3.25 with WBCs of 22.2.  Urine drug screen was positive for cocaine and tetrahydrocannabinol.  Alcohol level was less than 10.  He was started on IV fluids. ?Hospitalist service was called to evaluate the patient. ? ?Review of Systems: As per HPI otherwise all other systems were reviewed and are negative. ? ? ?Past Medical History:  ?Diagnosis Date  ? Asthma   ? Cannabinoid hyperemesis syndrome   ? HIV (human immunodeficiency virus infection) ([place])   ? Pancreatitis   ? Substance abuse ([place])   ? ? ?Past Surgical History:  ?Procedure Laterality Date  ? NO PAST SURGERIES    ? ? ? reports that he has been smoking cigarettes. He has been smoking an average of 1 pack per day. He has never used smokeless tobacco. He reports that he does not currently use alcohol. He reports current drug use. Drugs: Marijuana and Cocaine. ? ?No Known Allergies ? ?Family History  ?Problem  Relation Age of Onset  ? CAD Neg Hx   ? Diabetes Neg Hx   ? ? ?Prior to Admission medications   ?Medication Sig Start Date End Date Taking? Authorizing Provider  ?DOVATO 50-300 MG tablet Take 1 tablet by mouth daily. 07/16/21  Yes [provider]  ?omeprazole (PRILOSEC) 40 MG capsule Take 40 mg by mouth daily. 10/09/21  Yes [provider]  ?metoCLOPramide (REGLAN) 10 MG tablet Take 1 tablet (10 mg total) by mouth every 6 (six) hours as needed for nausea or vomiting. ?Patient not taking: No sig reported 06/10/19 11/17/20  Molpus, Jenny Reichmann, MD  ?sucralfate (CARAFATE) 1 g tablet Take 1 tablet (1 g total) by mouth 4 (four) times daily -  with meals and at bedtime. 06/10/19 06/10/19  Molpus, Jenny Reichmann, MD  ? ? ?Physical Exam: ?Vitals:  ? 11/28/21 1330 11/28/21 1500 11/28/21 1602 11/28/21 1611  ?BP: (!) 145/87 (!) 146/91 (!) 152/93   ?Pulse: (!) 108  98   ?Resp: 17 18 20    ?Temp:   98.3 ?F (36.8 ?C)   ?TempSrc:   Oral   ?SpO2: 97%  98%   ?Weight:    79.2 kg  ?Height:    6\' 1"  (1.854 m)  ? ? ?Constitutional: NAD, calm, comfortable ?Vitals:  ? 11/28/21 1330 11/28/21 1500 11/28/21 1602 11/28/21 1611  ?BP: (!) 145/87 (!) 146/91 (!) 152/93   ?Pulse: (!) 108  98   ?Resp: 17 18 20    ?Temp:   98.3 ?F (36.8 ?C)   ?TempSrc:   Oral   ?SpO2: 97%  98%   ?  Weight:    79.2 kg  ?Height:    6\' 1"  (1.854 m)  ? ?Eyes: PERRL, lids and conjunctivae normal ?ENMT: Mucous membranes are dry.  Posterior pharynx clear of any exudate or lesions. ?Neck: normal, supple, no masses, no thyromegaly ?Respiratory: bilateral decreased breath sounds at bases, no wheezing, no crackles. Normal respiratory effort. No accessory muscle use.  ?Cardiovascular: S1 S2 positive, intermittently tachycardic.  No extremity edema. 2+ pedal pulses.  ?Abdomen: Mild periumbilical tenderness; present no masses palpated. No hepatosplenomegaly. Bowel sounds positive.  ?Musculoskeletal: no clubbing / cyanosis. No joint deformity upper and lower extremities.  ?Skin: no rashes,  lesions, ulcers. No induration ?Neurologic: CN 2-12 grossly intact. Moving extremities. No focal neurologic deficits.  ?Psychiatric: Normal judgment and insight. Alert and oriented x 3. Normal mood.  ? ? ?Labs on Admission: I have personally reviewed following labs and imaging studies ? ?CBC: ?Recent Labs  ?Lab 11/28/21 ?0815  ?WBC 22.2*  ?NEUTROABS 18.7*  ?HGB 18.4*  ?HCT 54.1*  ?MCV 88.7  ?PLT 380  ? ?Basic Metabolic Panel: ?Recent Labs  ?Lab 11/28/21 ?0815  ?NA 136  ?K 4.4  ?CL 97*  ?CO2 21*  ?GLUCOSE 186*  ?BUN 26*  ?CREATININE 3.25*  ?CALCIUM 10.9*  ? ?GFR: ?Estimated Creatinine Clearance: 34.9 mL/min (A) (by C-G formula based on SCr of 3.25 mg/dL (H)). ?Liver Function Tests: ?Recent Labs  ?Lab 11/28/21 ?0815  ?AST 27  ?ALT 22  ?ALKPHOS 73  ?BILITOT 0.7  ?PROT 10.8*  ?ALBUMIN 5.5*  ? ?No results for input(s): LIPASE, AMYLASE in the last 168 hours. ?No results for input(s): AMMONIA in the last 168 hours. ?Coagulation Profile: ?No results for input(s): INR, PROTIME in the last 168 hours. ?Cardiac Enzymes: ?No results for input(s): CKTOTAL, CKMB, CKMBINDEX, TROPONINI in the last 168 hours. ?BNP (last 3 results) ?No results for input(s): PROBNP in the last 8760 hours. ?HbA1C: ?No results for input(s): HGBA1C in the last 72 hours. ?CBG: ?No results for input(s): GLUCAP in the last 168 hours. ?Lipid Profile: ?No results for input(s): CHOL, HDL, LDLCALC, TRIG, CHOLHDL, LDLDIRECT in the last 72 hours. ?Thyroid Function Tests: ?No results for input(s): TSH, T4TOTAL, FREET4, T3FREE, THYROIDAB in the last 72 hours. ?Anemia Panel: ?No results for input(s): VITAMINB12, FOLATE, FERRITIN, TIBC, IRON, RETICCTPCT in the last 72 hours. ?Urine analysis: ?   ?Component Value Date/Time  ? COLORURINE YELLOW 11/28/2021 1400  ? APPEARANCEUR HAZY (A) 11/28/2021 1400  ? APPEARANCEUR Clear 11/24/2013 1333  ? LABSPEC >1.030 (H) 11/28/2021 1400  ? LABSPEC 1.030 11/24/2013 1333  ? PHURINE 5.5 11/28/2021 1400  ? GLUCOSEU NEGATIVE 11/28/2021  1400  ? GLUCOSEU Negative 11/24/2013 1333  ? HGBUR NEGATIVE 11/28/2021 1400  ? BILIRUBINUR SMALL (A) 11/28/2021 1400  ? BILIRUBINUR 1+ 11/24/2013 1333  ? KETONESUR 40 (A) 11/28/2021 1400  ? PROTEINUR 100 (A) 11/28/2021 1400  ? UROBILINOGEN 1.0 01/04/2015 1651  ? NITRITE NEGATIVE 11/28/2021 1400  ? LEUKOCYTESUR TRACE (A) 11/28/2021 1400  ? LEUKOCYTESUR Negative 11/24/2013 1333  ? ? ?Radiological Exams on Admission: ?No results found. ? ?EKG: Independently reviewed.  Shows sinus tachycardia ?I have reviewed patient's labs during this admission myself. ? ?Assessment/Plan ? ?Acute kidney injury on chronic kidney disease stage IIIa ?Dehydration ?Intractable nausea and vomiting ?Cannabinoid hyperemesis syndrome ?-Urine drug screen tested positive for cocaine and marijuana.  Patient has had prior extensive GI evaluation and multiple prior admissions for this condition ?-Counseled regarding abstinence from illicit drugs.  TOC consult for the same. ?-Creatinine 3.25; creatinine 1.45  on 10/26/2021.  Possibly prerenal from dehydration.  Continue IV fluids.  Repeat a.m. labs ?-Possibly also related to gastritis.  Will add IV Protonix as well. ?-N.p.o. for now except for ice chips/sips of water ? ?Leukocytosis ?-Possibly reactive.  Repeat a.m. labs.  No need for antibiotics at this time. ? ?HIV ?-Compliant with Dovato.  Continue Dovato.  Outpatient follow-up with ID.  Apparently undetectable viral load currently. ? ?Tobacco abuse ?-Encourage cessation ? ?DVT prophylaxis: SCDs ?Code Status: Full ?Family Communication: None at bedside ?Disposition Plan: Home in 1 to 2 days once clinically improved  ?consults called: None ?Admission status: Observation/telemetry ? ?Severity of Illness: ?The appropriate patient status for this patient is OBSERVATION. Observation status is judged to be reasonable and necessary in order to provide the required intensity of service to ensure the patient's safety. The patient's presenting symptoms,  physical exam findings, and initial radiographic and laboratory data in the context of their medical condition is felt to place them at decreased risk for further clinical deterioration. Furthermore, it is Marketing executive

## 2021-11-28 NOTE — ED Provider Notes (Signed)
?[facility] EMERGENCY DEPARTMENT ?Provider Note ? ? ?CSN: [id number] ?Arrival date & time: 11/28/21  0754 ? ?  ? ?History ? ?Chief Complaint  ?Patient presents with  ? Emesis  ? ? ?Carlos Guzman is a 36 y.o. male. ? ?Patient with a history of hyperemesis felt to be secondary to cannabis use.  Patient seen frequently for this.  Patient's had admissions recently as well.  I saw patient back in November.  He responded very well to Haldol and Reglan at that time.  Patient states he has had vomiting nonstop for 24 hours has not had any bleeding.  It is associated with right lower quadrant abdominal pain.  Patient frequently does have right lower quadrant abdominal pain when he has the hyperemesis.  During his last admission there was concerns for appendicitis.  But it was ruled out during his hospitalization.  He did not have appendicitis.  Patient also has a history of HIV and polysubstance abuse.  Patient states he had multiple episodes of vomiting for the past 24 hours. ? ? ?  ? ?Home Medications ?Prior to Admission medications   ?Medication Sig Start Date End Date Taking? Authorizing Provider  ?DOVATO 50-300 MG tablet Take 1 tablet by mouth daily. 07/16/21  Yes [provider]  ?omeprazole (PRILOSEC) 40 MG capsule Take 40 mg by mouth daily. 10/09/21  Yes [provider]  ?metoCLOPramide (REGLAN) 10 MG tablet Take 1 tablet (10 mg total) by mouth every 6 (six) hours as needed for nausea or vomiting. ?Patient not taking: No sig reported 06/10/19 11/17/20  Molpus, Jonny RuizJohn, MD  ?sucralfate (CARAFATE) 1 g tablet Take 1 tablet (1 g total) by mouth 4 (four) times daily -  with meals and at bedtime. 06/10/19 06/10/19  Molpus, Jonny RuizJohn, MD  ?   ? ?Allergies    ?Patient has no known allergies.   ? ?Review of Systems   ?Review of Systems  ?Constitutional:  Negative for chills and fever.  ?HENT:  Negative for ear pain and sore throat.   ?Eyes:  Negative for pain and visual disturbance.  ?Respiratory:  Negative for  cough and shortness of breath.   ?Cardiovascular:  Negative for chest pain and palpitations.  ?Gastrointestinal:  Positive for abdominal pain, nausea and vomiting.  ?Genitourinary:  Negative for dysuria and hematuria.  ?Musculoskeletal:  Negative for arthralgias and back pain.  ?Skin:  Negative for color change and rash.  ?Neurological:  Negative for seizures and syncope.  ?All other systems reviewed and are negative. ? ?Physical Exam ?Updated Vital Signs ?BP (!) 162/101   Pulse 72   Temp 98 ?F (36.7 ?C) (Oral)   Resp 16   SpO2 100%  ?Physical Exam ?Vitals and nursing note reviewed.  ?Constitutional:   ?   General: He is in acute distress.  ?   Appearance: He is well-developed. He is ill-appearing.  ?HENT:  ?   Head: Normocephalic and atraumatic.  ?   Mouth/Throat:  ?   Mouth: Mucous membranes are dry.  ?Eyes:  ?   Extraocular Movements: Extraocular movements intact.  ?   Conjunctiva/sclera: Conjunctivae normal.  ?   Pupils: Pupils are equal, round, and reactive to light.  ?Cardiovascular:  ?   Rate and Rhythm: Normal rate and regular rhythm.  ?   Heart sounds: No murmur heard. ?Pulmonary:  ?   Effort: Pulmonary effort is normal. No respiratory distress.  ?   Breath sounds: Normal breath sounds.  ?Abdominal:  ?   Palpations: Abdomen is  soft.  ?   Tenderness: There is abdominal tenderness.  ?   Comments: Generalized abdominal tenderness.  ?Musculoskeletal:     ?   General: No swelling.  ?   Cervical back: Neck supple.  ?Skin: ?   General: Skin is warm and dry.  ?   Capillary Refill: Capillary refill takes less than 2 seconds.  ?Neurological:  ?   General: No focal deficit present.  ?   Mental Status: He is alert and oriented to person, place, and time.  ?Psychiatric:     ?   Mood and Affect: Mood normal.  ? ? ?ED Results / Procedures / Treatments   ?Labs ?(all labs ordered are listed, but only abnormal results are displayed) ?Labs Reviewed  ?COMPREHENSIVE METABOLIC PANEL - Abnormal; Notable for the following  components:  ?    Result Value  ? Chloride 97 (*)   ? CO2 21 (*)   ? Glucose, Bld 186 (*)   ? BUN 26 (*)   ? Creatinine, Ser 3.25 (*)   ? Calcium 10.9 (*)   ? Total Protein 10.8 (*)   ? Albumin 5.5 (*)   ? GFR, Estimated 24 (*)   ? Anion gap 18 (*)   ? All other components within normal limits  ?CBC WITH DIFFERENTIAL/PLATELET - Abnormal; Notable for the following components:  ? WBC 22.2 (*)   ? RBC 6.10 (*)   ? Hemoglobin 18.4 (*)   ? HCT 54.1 (*)   ? Neutro Abs 18.7 (*)   ? Abs Immature Granulocytes 0.31 (*)   ? All other components within normal limits  ?ETHANOL  ?RAPID URINE DRUG SCREEN, HOSP PERFORMED  ?URINALYSIS, ROUTINE W REFLEX MICROSCOPIC  ? ? ?EKG ?EKG Interpretation ? ?Date/Time:  Wednesday November 28 2021 08:54:13 EDT ?Ventricular Rate:  107 ?PR Interval:  145 ?QRS Duration: 87 ?QT Interval:  336 ?QTC Calculation: 449 ?R Axis:   84 ?Text Interpretation: Sinus tachycardia Consider left ventricular hypertrophy ST elev, probable normal early repol pattern No significant change since last tracing Confirmed by Vanetta Mulders [phone]) on 11/28/2021 8:58:06 AM ? ?Radiology ?No results found. ? ?Procedures ?Procedures  ? ? ?Medications Ordered in ED ?Medications  ?0.9 %  sodium chloride infusion (has no administration in time range)  ?sodium chloride 0.9 % bolus 1,000 mL (0 mLs Intravenous Stopped 11/28/21 0952)  ?fentaNYL (SUBLIMAZE) injection 50 mcg (50 mcg Intravenous Given 11/28/21 0850)  ?metoCLOPramide (REGLAN) injection 10 mg (10 mg Intravenous Given 11/28/21 0849)  ?haloperidol lactate (HALDOL) injection 5 mg (5 mg Intravenous Given 11/28/21 0850)  ?sodium chloride 0.9 % bolus 1,000 mL (1,000 mLs Intravenous New Bag/Given 11/28/21 0955)  ? ? ?ED Course/ Medical Decision Making/ A&P ?  ?                        ?Medical Decision Making ?Amount and/or Complexity of Data Reviewed ?Labs: ordered. ? ?Risk ?Prescription drug management. ?Decision regarding hospitalization. ? ?CRITICAL CARE ?Performed by: Vanetta Mulders ?Total critical care time: 45 minutes ?Critical care time was exclusive of separately billable procedures and treating other patients. ?Critical care was necessary to treat or prevent imminent or life-threatening deterioration. ?Critical care was time spent personally by me on the following activities: development of treatment plan with patient and/or surrogate as well as nursing, discussions with consultants, evaluation of patient's response to treatment, examination of patient, obtaining history from patient or surrogate, ordering and performing treatments and interventions, ordering and review  of laboratory studies, ordering and review of radiographic studies, pulse oximetry and re-evaluation of patient's condition. ? ?We will give IV fluids check labs go with Haldol and Reglan.  Patient's last EKG did not have a prolonged QT.  We will get an EKG today.  We will also give fentanyl. ? ?CBC has markedly elevated white count of 22,000 hemoglobin of 18.4.  Patient frequently does have elevated white blood cell counts.  Electrolytes still pending. ? ?We will hold off on getting CT scan of abdomen because he frequently has abdominal pain very commonly in the right lower quadrant with his hyperemesis.  We will see how that goes with treatment.  That may have resolved.  During last hospitalization a CT scan that raise concerns for appendicitis.  Seen by general surgery they did not feel was consistent with appendicitis symptoms resolved with treatment. ? ?QT is normal here today.  So no concerns with Reglan and Haldol. ? ?Patient improved with the fentanyl, Reglan and Haldol.  Abdominal pain is resolved.  Abdomen is completely nontender now.  I do not feel he needs a CT scan. ? ?However electrolytes show significant acute kidney injury.  With a GFR of 24.  Patient's creatinine is 3.25.  Based on this he is going to require medical admission. ? ?Urinalysis urine drug screen are pending. ? ? ? ?Final Clinical  Impression(s) / ED Diagnoses ?Final diagnoses:  ?AKI (acute kidney injury) (HCC)  ?Cannabinoid hyperemesis syndrome  ? ? ?Rx / DC Orders ?ED Discharge Orders   ? ? None  ? ?  ? ? ?  ?Vanetta Mulders, MD ?11/28/21 1002

## 2021-11-28 NOTE — ED Notes (Signed)
Called [facility] to advise bed was ready and requested transport ([facility] room [phone]) ?

## 2021-11-28 NOTE — ED Notes (Signed)
ED Provider at bedside. 

## 2021-11-28 NOTE — ED Triage Notes (Signed)
Pt reports n/v for the past 24 hours. No diarrhea or close contacts with similar symptoms.  ?

## 2021-11-28 NOTE — Progress Notes (Signed)
Plan of Care Note for accepted transfer ? ? ?Patient: Carlos Guzman MRN: [id number]   DOA: 11/28/2021 ? ?Facility requesting transfer: [facility].  ?Requesting Provider: Fredia Sorrow, MD ?Reason for transfer: Acute kidney injury. ?Facility course:  ?36 year old male with a past medical history of HIV, stage III CKD, cocaine abuse, alcohol abuse, cannabis hyperemesis syndrome who is coming to the emergency department with complaints of multiple episodes of nausea and vomiting associated with abdominal pain resulting in AKI secondary to dehydration.  Pain has resolved and he is responding to fluids.  Accepted to telemetry bed due to EKG showing ST elevation likely due to early repolarization pattern. ? ?Plan of care: ?The patient is accepted for admission to Telemetry unit, at [facility]. ? ?Author: ?Reubin Milan, MD ?11/28/2021 ? ?Check [email or web address] for on-call coverage. ? ?Nursing staff, Please call [facility] number on Amion as soon as patient's arrival, so appropriate admitting provider can evaluate the pt. ?

## 2021-11-28 NOTE — ED Notes (Signed)
Went into room. Pt was sticking his hand down his throat to throw up. Told pt not to do this as our goal was to keep fluids down. ?

## 2021-11-28 NOTE — Plan of Care (Signed)

## 2021-11-28 NOTE — ED Notes (Signed)
Gave pt urinal for sample when he is able to go ?

## 2021-11-29 DIAGNOSIS — B20 Human immunodeficiency virus [HIV] disease: Secondary | ICD-10-CM | POA: Diagnosis not present

## 2021-11-29 DIAGNOSIS — R112 Nausea with vomiting, unspecified: Secondary | ICD-10-CM | POA: Diagnosis not present

## 2021-11-29 DIAGNOSIS — N1831 Chronic kidney disease, stage 3a: Secondary | ICD-10-CM | POA: Diagnosis not present

## 2021-11-29 DIAGNOSIS — N179 Acute kidney failure, unspecified: Secondary | ICD-10-CM | POA: Diagnosis not present

## 2021-11-29 LAB — CBC
HCT: 45.9 % (ref 39.0–52.0)
Hemoglobin: 15.4 g/dL (ref 13.0–17.0)
MCH: 30.8 pg (ref 26.0–34.0)
MCHC: 33.6 g/dL (ref 30.0–36.0)
MCV: 91.8 fL (ref 80.0–100.0)
Platelets: 266 10*3/uL (ref 150–400)
RBC: 5 MIL/uL (ref 4.22–5.81)
RDW: 12.8 % (ref 11.5–15.5)
WBC: 11.5 10*3/uL — ABNORMAL HIGH (ref 4.0–10.5)
nRBC: 0 % (ref 0.0–0.2)

## 2021-11-29 LAB — COMPREHENSIVE METABOLIC PANEL
ALT: 16 U/L (ref 0–44)
AST: 20 U/L (ref 15–41)
Albumin: 3.8 g/dL (ref 3.5–5.0)
Alkaline Phosphatase: 47 U/L (ref 38–126)
Anion gap: 6 (ref 5–15)
BUN: 25 mg/dL — ABNORMAL HIGH (ref 6–20)
CO2: 22 mmol/L (ref 22–32)
Calcium: 9.1 mg/dL (ref 8.9–10.3)
Chloride: 108 mmol/L (ref 98–111)
Creatinine, Ser: 1.5 mg/dL — ABNORMAL HIGH (ref 0.61–1.24)
GFR, Estimated: >60 mL/min (ref >60)
Glucose, Bld: 107 mg/dL — ABNORMAL HIGH (ref 70–99)
Potassium: 4.1 mmol/L (ref 3.5–5.1)
Sodium: 136 mmol/L (ref 135–145)
Total Bilirubin: 0.5 mg/dL (ref 0.3–1.2)
Total Protein: 7 g/dL (ref 6.5–8.1)

## 2021-11-29 LAB — GLUCOSE, CAPILLARY
Glucose-Capillary: 99 mg/dL (ref 70–99)
Glucose-Capillary: 100 mg/dL — ABNORMAL HIGH (ref 70–99)
Glucose-Capillary: 102 mg/dL — ABNORMAL HIGH (ref 70–99)

## 2021-11-29 LAB — MAGNESIUM: Magnesium: 2 mg/dL (ref 1.7–2.4)

## 2021-11-29 MED ORDER — ONDANSETRON HCL 4 MG PO TABS: 4.0000 mg | ORAL_TABLET | Freq: Four times a day (QID) | ORAL | 0 refills | Status: DC | PRN

## 2021-11-29 MED ORDER — OMEPRAZOLE 40 MG PO CPDR: 40.0000 mg | DELAYED_RELEASE_CAPSULE | Freq: Every day | ORAL | 0 refills | Status: DC

## 2021-11-29 MED ORDER — DOLUTEGRAVIR SODIUM 50 MG PO TABS
50.0000 mg | ORAL_TABLET | Freq: Every day | ORAL | Status: DC
Start: 2021-11-29 — End: 2021-11-29
  Administered 2021-11-29: 50 mg via ORAL
  Filled 2021-11-29: qty 1

## 2021-11-29 MED ORDER — LAMIVUDINE 150 MG PO TABS
300.0000 mg | ORAL_TABLET | Freq: Every day | ORAL | Status: DC
  Administered 2021-11-29: 300 mg via ORAL
  Filled 2021-11-29: qty 2

## 2021-11-29 NOTE — Progress Notes (Signed)
Glucometer not uploading blood glucose results into chart. 0000, 0400 glucose results have been manually entered into Capillary Blood Glucose Monitoring flowsheet. ?

## 2021-11-29 NOTE — Discharge Summary (Signed)
Physician Discharge Summary  ?Gust Salz [id number] DOB: [birth year] DOA: 11/28/2021 ? ?PCP: Patient, No Pcp Per (Inactive) ? ?Admit date: 11/28/2021 ?Discharge date: 11/29/2021 ? ?Admitted From: Home ?Disposition: Home ? ?Recommendations for Outpatient Follow-up:  ?Follow up with PCP in 1 week with repeat BMP ?Outpatient follow-up with ID ?Abstain from illicit drug use as well as marijuana ?Follow up in ED if symptoms worsen or new appear ? ? ?Home Health: No ?Equipment/Devices: None ? ?Discharge Condition: Stable ?CODE STATUS: Full ?Diet recommendation: Regular ?Brief/Interim Summary: ?36 y.o. male with medical history significant of HIV, chronic kidney disease stage IIIa, cannabinoid hyperemesis syndrome with recurrent admissions for intractable nausea and vomiting with last admission from 10/25/2021-10/26/2021, polysubstance abuse presented with progressively worsening nausea and vomiting along with some abdominal pain.  He was found to have AKI with creatinine of 3.25 along with leukocytosis and urine drug screen was positive for cocaine and tetrahydrocannabinol.  He was started on IV fluids and antiemetics.  He was kept n.p.o. overnight.  His diet has been advanced to soft diet this morning.  He has tolerated diet and wants to go home.  He will be discharged home today with close outpatient follow-up with PCP.  His kidney function has returned back to normal. ? ?Discharge Diagnoses:  ? ?Acute kidney injury on chronic kidney disease stage IIIa ?Dehydration ?Intractable nausea and vomiting ?Cannabinoid hyperemesis syndrome ?-Urine drug screen tested positive for cocaine and marijuana.  Patient has had prior extensive GI evaluation and multiple prior admissions for this condition ?-Counseled regarding abstinence from illicit drugs.  TOC consulted for the same. ?-Creatinine 3.25 on presentation; creatinine 1.45 on 10/26/2021.  Possibly prerenal from dehydration.  Treated with IV fluids.  Creatinine 1.50  today. ?-Possibly also related to gastritis.  On IV Protonix as well. ?-He was kept n.p.o. overnight.  His diet has been advanced to soft diet this morning.  He has tolerated diet and wants to go home.  He will be discharged home today with close outpatient follow-up with PCP.  ?-Continue PPI on discharge. ?  ?Leukocytosis ?-Possibly reactive.  Much improved.  No need for antibiotics at this time. ?  ?HIV ?-Compliant with Dovato.  Continue Dovato.  Outpatient follow-up with ID.  Apparently undetectable viral load currently. ?  ?Tobacco abuse ?-Encourage cessation ? ?Discharge Instructions ? ?Discharge Instructions   ? ? Diet general   Complete by: As directed ?  ? Increase activity slowly   Complete by: As directed ?  ? ?  ? ?Allergies as of 11/29/2021   ?No Known Allergies ?  ? ?  ?Medication List  ?  ? ?TAKE these medications   ? ?Dovato 50-300 MG tablet ?Generic drug: dolutegravir-lamiVUDine ?Take 1 tablet by mouth daily. ?  ?omeprazole 40 MG capsule ?Commonly known as: PRILOSEC ?Take 1 capsule (40 mg total) by mouth daily. ?  ?ondansetron 4 MG tablet ?Commonly known as: ZOFRAN ?Take 1 tablet (4 mg total) by mouth every 6 (six) hours as needed for nausea. ?  ? ?  ? ? Follow-up Information   ? ? PCP. Schedule an appointment as soon as possible for a visit in 1 week(s).   ?Why: with repeat BMP ? ?  ?  ? ?  ?  ? ?  ? ?No Known Allergies ? ?Consultations: ?None ? ?Procedures/Studies: ?No results found. ? ? ? ?Subjective: ?Patient seen and examined at bedside.  Feels much better today and wants to go home today.  No overnight fever or vomiting reported.  No chest pain or shortness of breath reported. ? ?Discharge Exam: ?Vitals:  ? 11/28/21 2325 11/29/21 0353  ?BP: (!) 137/91 121/85  ?Pulse: 88 74  ?Resp: 16 18  ?Temp: 99 ?F (37.2 ?C) 98.5 ?F (36.9 ?C)  ?SpO2: 100% 100%  ? ? ?General: Pt is alert, awake, not in acute distress ?Cardiovascular: rate controlled, S1/S2 + ?Respiratory: bilateral decreased breath sounds at  bases ?Abdominal: Soft, NT, ND, bowel sounds + ?Extremities: no edema, no cyanosis ? ? ? ?The results of significant diagnostics from this hospitalization (including imaging, microbiology, ancillary and laboratory) are listed below for reference.   ? ? ?Microbiology: ?No results found for this or any previous visit (from the past 240 hour(s)).  ? ?Labs: ?BNP (last 3 results) ?No results for input(s): BNP in the last 8760 hours. ?Basic Metabolic Panel: ?Recent Labs  ?Lab 11/28/21 ?[id number] 11/29/21 ?0440  ?NA 136 136  ?K 4.4 4.1  ?CL 97* 108  ?CO2 21* 22  ?GLUCOSE 186* 107*  ?BUN 26* 25*  ?CREATININE 3.25* 1.50*  ?CALCIUM 10.9* 9.1  ?MG  --  2.0  ? ?Liver Function Tests: ?Recent Labs  ?Lab 11/28/21 ?[id number] 11/29/21 ?0440  ?AST 27 20  ?ALT 22 16  ?ALKPHOS 73 47  ?BILITOT 0.7 0.5  ?PROT 10.8* 7.0  ?ALBUMIN 5.5* 3.8  ? ?No results for input(s): LIPASE, AMYLASE in the last 168 hours. ?No results for input(s): AMMONIA in the last 168 hours. ?CBC: ?Recent Labs  ?Lab 11/28/21 ?[id number] 11/29/21 ?0440  ?WBC 22.2* 11.5*  ?NEUTROABS 18.7*  --   ?HGB 18.4* 15.4  ?HCT 54.1* 45.9  ?MCV 88.7 91.8  ?PLT 380 266  ? ?Cardiac Enzymes: ?No results for input(s): CKTOTAL, CKMB, CKMBINDEX, TROPONINI in the last 168 hours. ?BNP: ?Invalid input(s): POCBNP ?CBG: ?Recent Labs  ?Lab 11/28/21 ?2328 11/29/21 ?0356 11/29/21 ?[id number]  ?GLUCAP 99 100* 102*  ? ?D-Dimer ?No results for input(s): DDIMER in the last 72 hours. ?Hgb A1c ?No results for input(s): HGBA1C in the last 72 hours. ?Lipid Profile ?No results for input(s): CHOL, HDL, LDLCALC, TRIG, CHOLHDL, LDLDIRECT in the last 72 hours. ?Thyroid function studies ?No results for input(s): TSH, T4TOTAL, T3FREE, THYROIDAB in the last 72 hours. ? ?Invalid input(s): FREET3 ?Anemia work up ?No results for input(s): VITAMINB12, FOLATE, FERRITIN, TIBC, IRON, RETICCTPCT in the last 72 hours. ?Urinalysis ?   ?Component Value Date/Time  ? COLORURINE YELLOW 11/28/2021 1400  ? APPEARANCEUR HAZY (A) 11/28/2021 1400  ?  APPEARANCEUR Clear 11/24/2013 1333  ? LABSPEC >1.030 (H) 11/28/2021 1400  ? LABSPEC 1.030 11/24/2013 1333  ? PHURINE 5.5 11/28/2021 1400  ? GLUCOSEU NEGATIVE 11/28/2021 1400  ? GLUCOSEU Negative 11/24/2013 1333  ? HGBUR NEGATIVE 11/28/2021 1400  ? BILIRUBINUR SMALL (A) 11/28/2021 1400  ? BILIRUBINUR 1+ 11/24/2013 1333  ? KETONESUR 40 (A) 11/28/2021 1400  ? PROTEINUR 100 (A) 11/28/2021 1400  ? UROBILINOGEN 1.0 01/04/2015 1651  ? NITRITE NEGATIVE 11/28/2021 1400  ? LEUKOCYTESUR TRACE (A) 11/28/2021 1400  ? LEUKOCYTESUR Negative 11/24/2013 1333  ? ?Sepsis Labs ?Invalid input(s): PROCALCITONIN,  WBC,  LACTICIDVEN ?Microbiology ?No results found for this or any previous visit (from the past 240 hour(s)). ? ? ?Time coordinating discharge: 35 minutes ? ?SIGNED: ? ? ?Aline August, MD  ?[facility] ?11/29/2021, 10:32 AM ? ? ?

## 2022-02-07 DIAGNOSIS — R079 Chest pain, unspecified: Secondary | ICD-10-CM | POA: Diagnosis not present

## 2022-02-07 DIAGNOSIS — R000 Tachycardia, unspecified: Secondary | ICD-10-CM | POA: Diagnosis not present

## 2022-03-09 DIAGNOSIS — B20 Human immunodeficiency virus [HIV] disease: Secondary | ICD-10-CM | POA: Diagnosis not present

## 2022-03-09 DIAGNOSIS — F12288 Cannabis dependence with other cannabis-induced disorder: Secondary | ICD-10-CM | POA: Diagnosis not present

## 2022-03-09 DIAGNOSIS — R1013 Epigastric pain: Secondary | ICD-10-CM | POA: Diagnosis not present

## 2022-05-08 ENCOUNTER — Emergency Department (HOSPITAL_BASED_OUTPATIENT_CLINIC_OR_DEPARTMENT_OTHER)
Admission: EM | Admit: 2022-05-08 | Discharge: 2022-05-08 | Disposition: A | Discharge status: Home / Self Care | Payer: Medicaid Other | Attending: Emergency Medicine | Admitting: Emergency Medicine

## 2022-05-08 ENCOUNTER — Encounter (HOSPITAL_BASED_OUTPATIENT_CLINIC_OR_DEPARTMENT_OTHER): Payer: Self-pay

## 2022-05-08 ENCOUNTER — Emergency Department (HOSPITAL_BASED_OUTPATIENT_CLINIC_OR_DEPARTMENT_OTHER): Payer: Medicaid Other

## 2022-05-08 DIAGNOSIS — E876 Hypokalemia: Secondary | ICD-10-CM | POA: Insufficient documentation

## 2022-05-08 DIAGNOSIS — J45909 Unspecified asthma, uncomplicated: Secondary | ICD-10-CM | POA: Diagnosis not present

## 2022-05-08 DIAGNOSIS — Z21 Asymptomatic human immunodeficiency virus [HIV] infection status: Secondary | ICD-10-CM | POA: Insufficient documentation

## 2022-05-08 DIAGNOSIS — K2920 Alcoholic gastritis without bleeding: Secondary | ICD-10-CM | POA: Insufficient documentation

## 2022-05-08 DIAGNOSIS — E871 Hypo-osmolality and hyponatremia: Secondary | ICD-10-CM | POA: Diagnosis not present

## 2022-05-08 DIAGNOSIS — R944 Abnormal results of kidney function studies: Secondary | ICD-10-CM | POA: Insufficient documentation

## 2022-05-08 DIAGNOSIS — R112 Nausea with vomiting, unspecified: Secondary | ICD-10-CM | POA: Diagnosis present

## 2022-05-08 DIAGNOSIS — Z79899 Other long term (current) drug therapy: Secondary | ICD-10-CM | POA: Insufficient documentation

## 2022-05-08 DIAGNOSIS — D72829 Elevated white blood cell count, unspecified: Secondary | ICD-10-CM | POA: Insufficient documentation

## 2022-05-08 DIAGNOSIS — R000 Tachycardia, unspecified: Secondary | ICD-10-CM | POA: Diagnosis not present

## 2022-05-08 DIAGNOSIS — R1110 Vomiting, unspecified: Secondary | ICD-10-CM | POA: Diagnosis not present

## 2022-05-08 DIAGNOSIS — R109 Unspecified abdominal pain: Secondary | ICD-10-CM | POA: Diagnosis not present

## 2022-05-08 LAB — CBC
HCT: 55.4 % — ABNORMAL HIGH (ref 39.0–52.0)
Hemoglobin: 19.4 g/dL — ABNORMAL HIGH (ref 13.0–17.0)
MCH: 29.8 pg (ref 26.0–34.0)
MCHC: 35 g/dL (ref 30.0–36.0)
MCV: 85.1 fL (ref 80.0–100.0)
Platelets: 348 10*3/uL (ref 150–400)
RBC: 6.51 MIL/uL — ABNORMAL HIGH (ref 4.22–5.81)
RDW: 13.3 % (ref 11.5–15.5)
WBC: 20.2 10*3/uL — ABNORMAL HIGH (ref 4.0–10.5)
nRBC: 0 % (ref 0.0–0.2)

## 2022-05-08 LAB — BASIC METABOLIC PANEL
Anion gap: 17 — ABNORMAL HIGH (ref 5–15)
BUN: 40 mg/dL — ABNORMAL HIGH (ref 6–20)
CO2: 22 mmol/L (ref 22–32)
Calcium: 10.1 mg/dL (ref 8.9–10.3)
Chloride: 93 mmol/L — ABNORMAL LOW (ref 98–111)
Creatinine, Ser: 2.32 mg/dL — ABNORMAL HIGH (ref 0.61–1.24)
GFR, Estimated: 36 mL/min — ABNORMAL LOW (ref >60)
Glucose, Bld: 142 mg/dL — ABNORMAL HIGH (ref 70–99)
Potassium: 3.4 mmol/L — ABNORMAL LOW (ref 3.5–5.1)
Sodium: 132 mmol/L — ABNORMAL LOW (ref 135–145)

## 2022-05-08 LAB — LIPASE, BLOOD: Lipase: 31 U/L (ref 11–51)

## 2022-05-08 MED ORDER — PANTOPRAZOLE SODIUM 40 MG IV SOLR
40.0000 mg | Freq: Once | INTRAVENOUS | Status: AC
  Administered 2022-05-08: 40 mg via INTRAVENOUS
  Filled 2022-05-08: qty 10

## 2022-05-08 MED ORDER — ALUM & MAG HYDROXIDE-SIMETH 200-200-20 MG/5ML PO SUSP
15.0000 mL | Freq: Once | ORAL | Status: AC
  Administered 2022-05-08: 15 mL via ORAL
  Filled 2022-05-08: qty 30

## 2022-05-08 MED ORDER — AMLODIPINE BESYLATE 5 MG PO TABS
5.0000 mg | ORAL_TABLET | Freq: Once | ORAL | Status: AC
  Administered 2022-05-08: 5 mg via ORAL
  Filled 2022-05-08: qty 1

## 2022-05-08 MED ORDER — IOHEXOL 300 MG/ML  SOLN
100.0000 mL | Freq: Once | INTRAMUSCULAR | Status: AC | PRN
  Administered 2022-05-08: 80 mL via INTRAVENOUS

## 2022-05-08 MED ORDER — LACTATED RINGERS IV BOLUS
1000.0000 mL | Freq: Once | INTRAVENOUS | Status: AC
  Administered 2022-05-08: 1000 mL via INTRAVENOUS

## 2022-05-08 MED ORDER — SUCRALFATE 1 GM/10ML PO SUSP
1.0000 g | Freq: Once | ORAL | Status: AC
  Administered 2022-05-08: 1 g via ORAL
  Filled 2022-05-08: qty 10

## 2022-05-08 MED ORDER — AMLODIPINE BESYLATE 5 MG PO TABS: 5.0000 mg | ORAL_TABLET | Freq: Every day | ORAL | 0 refills | Status: AC

## 2022-05-08 MED ORDER — ONDANSETRON HCL 4 MG/2ML IJ SOLN
4.0000 mg | Freq: Once | INTRAMUSCULAR | Status: AC
Start: 2022-05-08 — End: 2022-05-08
  Administered 2022-05-08: 4 mg via INTRAVENOUS
  Filled 2022-05-08: qty 2

## 2022-05-08 MED ORDER — OMEPRAZOLE 20 MG PO CPDR: 20.0000 mg | DELAYED_RELEASE_CAPSULE | Freq: Every day | ORAL | 0 refills | Status: DC

## 2022-05-08 NOTE — ED Triage Notes (Signed)
C/o emesis since Sunday. States drank too much alcohol and hasnt been able to recover. Able to drink some water.

## 2022-05-08 NOTE — ED Notes (Signed)
History of cannabis induced hyperemesis, states hasnt smoked since before sunday

## 2022-05-08 NOTE — Discharge Instructions (Addendum)
Please return to the ED with any new or worsening signs or symptoms such as increased nausea or vomiting, fevers Please continue hydrating yourself as an outpatient Please avoid any alcohol or marijuana use. Please continue taking famotidine, omeprazole for stomach pain Please begin taking once daily high blood pressure medication I prescribed you

## 2022-05-08 NOTE — ED Notes (Signed)
B/P and heart rate elevated, provider aware, see mar

## 2022-05-08 NOTE — ED Provider Notes (Signed)
[facility] EMERGENCY DEPARTMENT Provider Note   CSN: [id number] Arrival date & time: 05/08/22  0920     History  Chief Complaint  Patient presents with   Vomiting    Carlos Guzman is a 36 y.o. male with medical history of HIV, pancreatitis, cannabinoid hyperemesis syndrome, asthma.  Patient presents to ED for evaluation of nausea and vomiting.  Patient reports that on Sunday he drank an excessive amount of alcohol, is unable to tell me how much.  The patient reports that he drank beer, liquor, wine.  The patient is unable to have the amounts that he drank.  The patient states that ever since this time on Sunday he has had excessive nausea and vomiting.  The patient believes that this is a result of him being hung over.  The patient denies any marijuana use, states that he has not smoked marijuana in 1 month.  Patient is endorsing abdominal pain as well as nausea and vomiting.  The patient denies any fevers, diarrhea, dysuria, blood in vomit.  HPI     Home Medications Prior to Admission medications   Medication Sig Start Date End Date Taking? Authorizing Provider  amLODipine (NORVASC) 5 MG tablet Take 1 tablet (5 mg total) by mouth daily. 05/08/22  Yes Al Decant, PA-C  omeprazole (PRILOSEC) 20 MG capsule Take 1 capsule (20 mg total) by mouth daily. 05/08/22  Yes Delice Bison F, PA-C  DOVATO 50-300 MG tablet Take 1 tablet by mouth daily. 07/16/21   [provider]  ondansetron (ZOFRAN) 4 MG tablet Take 1 tablet (4 mg total) by mouth every 6 (six) hours as needed for nausea. 11/29/21   Glade Lloyd, MD  metoCLOPramide (REGLAN) 10 MG tablet Take 1 tablet (10 mg total) by mouth every 6 (six) hours as needed for nausea or vomiting. Patient not taking: No sig reported 06/10/19 11/17/20  Molpus, Jonny Ruiz, MD  sucralfate (CARAFATE) 1 g tablet Take 1 tablet (1 g total) by mouth 4 (four) times daily -  with meals and at bedtime. 06/10/19 06/10/19  Molpus, Jonny Ruiz, MD       Allergies    Patient has no known allergies.    Review of Systems   Review of Systems  Constitutional:  Positive for chills. Negative for fever.  Gastrointestinal:  Positive for abdominal pain, nausea and vomiting. Negative for diarrhea.  Genitourinary:  Negative for dysuria.  All other systems reviewed and are negative.   Physical Exam Updated Vital Signs BP (!) 159/93   Pulse (!) 101   Temp 98 F (36.7 C) (Oral)   Resp 19   Ht 6\' 1"  (1.854 m)   Wt 79.4 kg   SpO2 100%   BMI 23.09 kg/m  Physical Exam Vitals and nursing note reviewed.  Constitutional:      General: He is not in acute distress.    Appearance: Normal appearance. He is ill-appearing. He is not toxic-appearing or diaphoretic.  HENT:     Head: Normocephalic and atraumatic.     Nose: Nose normal. No congestion.     Mouth/Throat:     Mouth: Mucous membranes are moist.     Pharynx: Oropharynx is clear.  Eyes:     Extraocular Movements: Extraocular movements intact.     Conjunctiva/sclera: Conjunctivae normal.     Pupils: Pupils are equal, round, and reactive to light.  Cardiovascular:     Rate and Rhythm: Normal rate and regular rhythm.  Pulmonary:     Effort: Pulmonary effort is  normal.     Breath sounds: Normal breath sounds. No wheezing.  Abdominal:     General: Abdomen is flat. Bowel sounds are normal.     Palpations: Abdomen is soft.     Tenderness: There is generalized abdominal tenderness. There is no right CVA tenderness or left CVA tenderness.  Musculoskeletal:     Cervical back: Normal range of motion and neck supple. No tenderness.  Skin:    General: Skin is warm and dry.     Capillary Refill: Capillary refill takes less than 2 seconds.  Neurological:     Mental Status: He is alert and oriented to person, place, and time.     GCS: GCS eye subscore is 4. GCS verbal subscore is 5. GCS motor subscore is 6.     Cranial Nerves: Cranial nerves 2-12 are intact. No cranial nerve deficit.      Sensory: Sensation is intact. No sensory deficit.     Motor: Motor function is intact. No weakness.     Coordination: Coordination is intact. Heel to [facility] Test normal.     ED Results / Procedures / Treatments   Labs (all labs ordered are listed, but only abnormal results are displayed) Labs Reviewed  CBC - Abnormal; Notable for the following components:      Result Value   WBC 20.2 (*)    RBC 6.51 (*)    Hemoglobin 19.4 (*)    HCT 55.4 (*)    All other components within normal limits  BASIC METABOLIC PANEL - Abnormal; Notable for the following components:   Sodium 132 (*)    Potassium 3.4 (*)    Chloride 93 (*)    Glucose, Bld 142 (*)    BUN 40 (*)    Creatinine, Ser 2.32 (*)    GFR, Estimated 36 (*)    Anion gap 17 (*)    All other components within normal limits  LIPASE, BLOOD  URINALYSIS, ROUTINE W REFLEX MICROSCOPIC    EKG None  Radiology CT ABDOMEN PELVIS W CONTRAST  Result Date: 05/08/2022 CLINICAL DATA:  Abdominal pain and vomiting since Sunday. EXAM: CT ABDOMEN AND PELVIS WITH CONTRAST TECHNIQUE: Multidetector CT imaging of the abdomen and pelvis was performed using the standard protocol following bolus administration of intravenous contrast. RADIATION DOSE REDUCTION: This exam was performed according to the departmental dose-optimization program which includes automated exposure control, adjustment of the mA and/or kV according to patient size and/or use of iterative reconstruction technique. CONTRAST:  55mL OMNIPAQUE IOHEXOL 300 MG/ML  SOLN COMPARISON:  CT abdomen pelvis dated March 09, 2022. FINDINGS: Lower chest: No acute abnormality. Hepatobiliary: Unchanged tiny subcentimeter low-density lesion in the right liver, too small to characterize. No new focal liver abnormality. The gallbladder is unremarkable. No biliary dilatation. Pancreas: Unremarkable. No pancreatic ductal dilatation or surrounding inflammatory changes. Spleen: Normal in size without focal abnormality.  Adrenals/Urinary Tract: Adrenal glands are unremarkable. Kidneys are normal, without renal calculi, focal lesion, or hydronephrosis. Bladder is unremarkable. Stomach/Bowel: Stomach is within normal limits. Appendix appears normal. No evidence of bowel wall thickening, distention, or inflammatory changes. Vascular/Lymphatic: No significant vascular findings are present. No enlarged abdominal or pelvic lymph nodes. Reproductive: Prostate is unremarkable. Other: No abdominal wall hernia or abnormality. No abdominopelvic ascites. No pneumoperitoneum. Musculoskeletal: No acute or significant osseous findings. IMPRESSION: 1. No acute intra-abdominal process. Electronically Signed   By: Titus Dubin M.D.   On: 05/08/2022 12:44    Procedures Procedures   Medications Ordered in ED Medications  lactated ringers bolus 1,000 mL (0 mLs Intravenous Stopped 05/08/22 1302)  ondansetron (ZOFRAN) injection 4 mg (4 mg Intravenous Given 05/08/22 1045)  sucralfate (CARAFATE) 1 GM/10ML suspension 1 g (1 g Oral Given 05/08/22 1131)  alum & mag hydroxide-simeth (MAALOX/MYLANTA) 200-200-20 MG/5ML suspension 15 mL (15 mLs Oral Given 05/08/22 1131)  pantoprazole (PROTONIX) injection 40 mg (40 mg Intravenous Given 05/08/22 1131)  lactated ringers bolus 1,000 mL (0 mLs Intravenous Stopped 05/08/22 1302)  amLODipine (NORVASC) tablet 5 mg (5 mg Oral Given 05/08/22 1143)  iohexol (OMNIPAQUE) 300 MG/ML solution 100 mL (80 mLs Intravenous Contrast Given 05/08/22 1227)    ED Course/ Medical Decision Making/ A&P                           Medical Decision Making Amount and/or Complexity of Data Reviewed Labs: ordered. Radiology: ordered.  Risk OTC drugs. Prescription drug management.   36 year old male presents to ED for evaluation.  Please see HPI for further details.  On examination patient's afebrile, tachycardic to 107 on the monitor.  The patient lung sounds are clear bilaterally, he is not hypoxic.  Patient has  generalized abdominal tenderness however all 4 quadrants of the abdomen are soft and compressible.  Patient neurological examination shows no focal neurodeficits.  Patient worked up utilizing the following labs and imaging studies interpreted by me personally: - CBC with leukocytosis of 20.2, hemoglobin 19.4 - BMP with decreased sodium to 132, decreased potassium at 3.4, elevated creatinine of 2.32, decreased GFR to 36.  On chart review the patient had a creatinine of 1.50 5 months ago.  Patient was treated with 2 L of lactated Ringer's - Lipase collected due to patient history of pancreatitis, lipase unremarkable - Urinalysis not collected - CT abdomen pelvis does not show any intra-abdominal pathology - EKG nonischemic, QT is not prolonged  Patient treated with 2 L lactated ringer, 5 mg amlodipine for high blood pressure, Maalox/Mylanta for gastritis, 40 mg Protonix for gastritis, 1 g Carafate for gastritis, 4 mg Zofran for nausea and vomiting.  Patient received these medications and after he had been in the department for 3-1/2 hours stated that he felt much better.  The patient was requesting discharge at this time, is requesting ginger ale and water.  Patient drank ginger ale water and showed the ability to keep down fluids in the department.  Patient has not had any nausea or vomiting while in the department.  Patient discharged home with amlodipine for high blood pressure, omeprazole.  The patient was counseled on marijuana cessation methods as well as alcohol cessation.  The patient was counseled that he should stop drinking alcohol until his stomach pain subsides.  The patient voiced understanding of my instructions.  The patient was given return precautions and he voiced understanding of these.  The patient had all his questions answered his satisfaction.  The patient is stable at this time for discharge home.  Final Clinical Impression(s) / ED Diagnoses Final diagnoses:  Acute alcoholic  gastritis without hemorrhage    Rx / DC Orders ED Discharge Orders          Ordered    amLODipine (NORVASC) 5 MG tablet  Daily        05/08/22 1314    omeprazole (PRILOSEC) 20 MG capsule  Daily        05/08/22 1314              Al Decant, [place] 05/08/22  [id number]    Lianne Cure, DO [id number]

## 2022-05-08 NOTE — ED Notes (Signed)
Discharged by provider. Pt did not stay to get discharge signature.

## 2022-05-09 ENCOUNTER — Telehealth: Payer: Self-pay

## 2022-05-09 NOTE — Telephone Encounter (Signed)
Transition Care Management Unsuccessful Follow-up Telephone Call  Date of discharge and from where:  05/08/2022 from [facility]  Attempts:  1st Attempt  Reason for unsuccessful TCM follow-up call:  Left voice message

## 2022-05-10 NOTE — Telephone Encounter (Signed)
Transition Care Management Unsuccessful Follow-up Telephone Call  Date of discharge and from where:  05/08/2022 from [facility]  Attempts:  2nd Attempt  Reason for unsuccessful TCM follow-up call:  Voice mail full

## 2022-05-14 DIAGNOSIS — R112 Nausea with vomiting, unspecified: Secondary | ICD-10-CM | POA: Diagnosis not present

## 2022-05-14 DIAGNOSIS — R1084 Generalized abdominal pain: Secondary | ICD-10-CM | POA: Diagnosis not present

## 2022-05-17 NOTE — Telephone Encounter (Signed)
Transition Care Management Unsuccessful Follow-up Telephone Call  Date of discharge and from where:  05/08/2022 from [facility]  Attempts:  3rd Attempt  Reason for unsuccessful TCM follow-up call:  Unable to reach patient

## 2022-07-18 ENCOUNTER — Encounter (HOSPITAL_BASED_OUTPATIENT_CLINIC_OR_DEPARTMENT_OTHER): Payer: Self-pay | Admitting: Emergency Medicine

## 2022-07-18 ENCOUNTER — Emergency Department (HOSPITAL_BASED_OUTPATIENT_CLINIC_OR_DEPARTMENT_OTHER): Payer: Medicaid Other

## 2022-07-18 ENCOUNTER — Observation Stay (HOSPITAL_BASED_OUTPATIENT_CLINIC_OR_DEPARTMENT_OTHER)
Admission: EM | Admit: 2022-07-18 | Discharge: 2022-07-19 | Disposition: A | Discharge status: Home / Self Care | Payer: Medicaid Other | Attending: Internal Medicine | Admitting: Internal Medicine

## 2022-07-18 ENCOUNTER — Encounter (HOSPITAL_COMMUNITY): Payer: Self-pay

## 2022-07-18 ENCOUNTER — Other Ambulatory Visit: Payer: Self-pay

## 2022-07-18 DIAGNOSIS — J45909 Unspecified asthma, uncomplicated: Secondary | ICD-10-CM | POA: Diagnosis not present

## 2022-07-18 DIAGNOSIS — Z79899 Other long term (current) drug therapy: Secondary | ICD-10-CM | POA: Diagnosis not present

## 2022-07-18 DIAGNOSIS — F17210 Nicotine dependence, cigarettes, uncomplicated: Secondary | ICD-10-CM | POA: Diagnosis not present

## 2022-07-18 DIAGNOSIS — R112 Nausea with vomiting, unspecified: Secondary | ICD-10-CM | POA: Diagnosis not present

## 2022-07-18 DIAGNOSIS — D72829 Elevated white blood cell count, unspecified: Secondary | ICD-10-CM | POA: Insufficient documentation

## 2022-07-18 DIAGNOSIS — E876 Hypokalemia: Secondary | ICD-10-CM | POA: Diagnosis not present

## 2022-07-18 DIAGNOSIS — N179 Acute kidney failure, unspecified: Principal | ICD-10-CM

## 2022-07-18 DIAGNOSIS — B20 Human immunodeficiency virus [HIV] disease: Secondary | ICD-10-CM | POA: Diagnosis not present

## 2022-07-18 DIAGNOSIS — N1831 Chronic kidney disease, stage 3a: Secondary | ICD-10-CM | POA: Insufficient documentation

## 2022-07-18 DIAGNOSIS — R109 Unspecified abdominal pain: Secondary | ICD-10-CM | POA: Diagnosis not present

## 2022-07-18 LAB — COMPREHENSIVE METABOLIC PANEL
ALT: 24 U/L (ref 0–44)
AST: 21 U/L (ref 15–41)
Albumin: 5.2 g/dL — ABNORMAL HIGH (ref 3.5–5.0)
Alkaline Phosphatase: 66 U/L (ref 38–126)
Anion gap: 22 — ABNORMAL HIGH (ref 5–15)
BUN: 45 mg/dL — ABNORMAL HIGH (ref 6–20)
CO2: 18 mmol/L — ABNORMAL LOW (ref 22–32)
Calcium: 10.1 mg/dL (ref 8.9–10.3)
Chloride: 91 mmol/L — ABNORMAL LOW (ref 98–111)
Creatinine, Ser: 4.45 mg/dL — ABNORMAL HIGH (ref 0.61–1.24)
GFR, Estimated: 16 mL/min — ABNORMAL LOW (ref >60)
Glucose, Bld: 170 mg/dL — ABNORMAL HIGH (ref 70–99)
Potassium: 3.3 mmol/L — ABNORMAL LOW (ref 3.5–5.1)
Sodium: 131 mmol/L — ABNORMAL LOW (ref 135–145)
Total Bilirubin: 0.9 mg/dL (ref 0.3–1.2)
Total Protein: 10.2 g/dL — ABNORMAL HIGH (ref 6.5–8.1)

## 2022-07-18 LAB — URINALYSIS, MICROSCOPIC (REFLEX)

## 2022-07-18 LAB — URINALYSIS, ROUTINE W REFLEX MICROSCOPIC
Glucose, UA: NEGATIVE mg/dL
Ketones, ur: NEGATIVE mg/dL
Leukocytes,Ua: NEGATIVE
Nitrite: NEGATIVE
Protein, ur: 100 mg/dL — AB
Specific Gravity, Urine: 1.025 (ref 1.005–1.030)
pH: 5.5 (ref 5.0–8.0)

## 2022-07-18 LAB — CBC
HCT: 52.3 % — ABNORMAL HIGH (ref 39.0–52.0)
Hemoglobin: 18.6 g/dL — ABNORMAL HIGH (ref 13.0–17.0)
MCH: 30.1 pg (ref 26.0–34.0)
MCHC: 35.6 g/dL (ref 30.0–36.0)
MCV: 84.8 fL (ref 80.0–100.0)
Platelets: 405 10*3/uL — ABNORMAL HIGH (ref 150–400)
RBC: 6.17 MIL/uL — ABNORMAL HIGH (ref 4.22–5.81)
RDW: 13.2 % (ref 11.5–15.5)
WBC: 20.5 10*3/uL — ABNORMAL HIGH (ref 4.0–10.5)
nRBC: 0 % (ref 0.0–0.2)

## 2022-07-18 LAB — RAPID URINE DRUG SCREEN, HOSP PERFORMED
Amphetamines: NOT DETECTED
Barbiturates: NOT DETECTED
Benzodiazepines: NOT DETECTED
Cocaine: POSITIVE — AB
Opiates: NOT DETECTED
Tetrahydrocannabinol: POSITIVE — AB

## 2022-07-18 LAB — LIPASE, BLOOD: Lipase: 38 U/L (ref 11–51)

## 2022-07-18 LAB — ETHANOL: Alcohol, Ethyl (B): <10 mg/dL (ref <10)

## 2022-07-18 LAB — LACTIC ACID, PLASMA
Lactic Acid, Venous: 2.6 mmol/L (ref 0.5–1.9)
Lactic Acid, Venous: 1.7 mmol/L (ref 0.5–1.9)

## 2022-07-18 MED ORDER — SODIUM CHLORIDE 0.9 % IV SOLN: INTRAVENOUS | Status: DC

## 2022-07-18 MED ORDER — LORAZEPAM 2 MG/ML IJ SOLN: 0.0000 mg | Freq: Two times a day (BID) | INTRAMUSCULAR | Status: DC

## 2022-07-18 MED ORDER — SODIUM CHLORIDE 0.9 % IV BOLUS
1000.0000 mL | Freq: Once | INTRAVENOUS | Status: AC
  Administered 2022-07-18: 1000 mL via INTRAVENOUS

## 2022-07-18 MED ORDER — LORAZEPAM 2 MG/ML IJ SOLN
0.0000 mg | Freq: Four times a day (QID) | INTRAMUSCULAR | Status: DC
  Administered 2022-07-18 (×2): 2 mg via INTRAVENOUS
  Filled 2022-07-18: qty 1

## 2022-07-18 MED ORDER — HYDROMORPHONE HCL 1 MG/ML IJ SOLN
1.0000 mg | Freq: Once | INTRAMUSCULAR | Status: AC
  Administered 2022-07-18: 1 mg via INTRAVENOUS
  Filled 2022-07-18: qty 1

## 2022-07-18 MED ORDER — ADULT MULTIVITAMIN W/MINERALS CH
1.0000 | ORAL_TABLET | Freq: Every day | ORAL | Status: DC
  Administered 2022-07-18: 1 via ORAL
  Filled 2022-07-18: qty 1

## 2022-07-18 MED ORDER — THIAMINE HCL 100 MG/ML IJ SOLN
100.0000 mg | Freq: Every day | INTRAMUSCULAR | Status: DC
  Administered 2022-07-18: 100 mg via INTRAVENOUS
  Filled 2022-07-18: qty 2

## 2022-07-18 MED ORDER — THIAMINE MONONITRATE 100 MG PO TABS
100.0000 mg | ORAL_TABLET | Freq: Every day | ORAL | Status: DC
  Administered 2022-07-19: 100 mg via ORAL
  Filled 2022-07-18 (×2): qty 1

## 2022-07-18 MED ORDER — FOLIC ACID 1 MG PO TABS
1.0000 mg | ORAL_TABLET | Freq: Every day | ORAL | Status: DC
  Administered 2022-07-18 – 2022-07-19 (×2): 1 mg via ORAL
  Filled 2022-07-18 (×2): qty 1

## 2022-07-18 MED ORDER — ALBUTEROL SULFATE (2.5 MG/3ML) 0.083% IN NEBU: 2.5000 mg | INHALATION_SOLUTION | RESPIRATORY_TRACT | Status: DC | PRN

## 2022-07-18 MED ORDER — ONDANSETRON HCL 4 MG/2ML IJ SOLN
4.0000 mg | Freq: Once | INTRAMUSCULAR | Status: AC | PRN
  Administered 2022-07-18: 4 mg via INTRAVENOUS
  Filled 2022-07-18: qty 2

## 2022-07-18 MED ORDER — FENTANYL CITRATE PF 50 MCG/ML IJ SOSY
12.5000 ug | PREFILLED_SYRINGE | INTRAMUSCULAR | Status: DC | PRN
  Administered 2022-07-19: 50 ug via INTRAVENOUS
  Filled 2022-07-18: qty 1

## 2022-07-18 MED ORDER — DOLUTEGRAVIR-LAMIVUDINE 50-300 MG PO TABS
1.0000 | ORAL_TABLET | Freq: Every day | ORAL | Status: DC
  Administered 2022-07-19: 1 via ORAL
  Filled 2022-07-18: qty 1

## 2022-07-18 MED ORDER — HEPARIN SODIUM (PORCINE) 5000 UNIT/ML IJ SOLN
5000.0000 [IU] | Freq: Three times a day (TID) | INTRAMUSCULAR | Status: DC
  Administered 2022-07-19 (×2): 5000 [IU] via SUBCUTANEOUS
  Filled 2022-07-18 (×2): qty 1

## 2022-07-18 MED ORDER — LORAZEPAM 2 MG/ML IJ SOLN
1.0000 mg | INTRAMUSCULAR | Status: DC | PRN
  Filled 2022-07-18: qty 1

## 2022-07-18 MED ORDER — LORAZEPAM 1 MG PO TABS
1.0000 mg | ORAL_TABLET | ORAL | Status: DC | PRN
  Filled 2022-07-18: qty 2

## 2022-07-18 NOTE — ED Triage Notes (Signed)
N/V/D started Saturday.  Pt was getting better but then got worse.  No known fever.  Some abdominal pain.

## 2022-07-18 NOTE — ED Notes (Signed)
Unable to void at this time.

## 2022-07-18 NOTE — ED Notes (Signed)
Attempted NG tube x 2. Patient unable to tolerate tube advancement past pharynx. Refusing further attempts.

## 2022-07-18 NOTE — ED Notes (Signed)
ED TO INPATIENT HANDOFF REPORT  ED Nurse Name and Phone #:   Docia Furl RN [phone]  S Name/Age/Gender Carlos Guzman 36 y.o. male Room/Bed: MH10/MH10  Code Status   Code Status: Prior  Home/SNF/Other Home Patient oriented to: self, place, time, and situation Is this baseline? Yes   Triage Complete: Triage complete  Chief Complaint AKI (acute kidney injury) [facility]) [N17.9]  Triage Note N/V/D started Saturday.  Pt was getting better but then got worse.  No known fever.  Some abdominal pain.   Allergies Allergies  Allergen Reactions   Droperidol Other (See Comments)    Extrapyramidal reaction - medicate with benadryl first    Level of Care/Admitting Diagnosis ED Disposition     ED Disposition  Admit   Condition  --   Comment  Hospital Area: [facility] [[id number]] Level of Care: Telemetry [5] Admit to tele based on following criteria: Monitor for Ischemic changes Interfacility transfer: Yes May place patient in observation at [facility] or [facility]  if equivalent level of care is available:: No Covid Evaluation: Asymptomatic - no recent exposure (last 10 days) testing not required Diagnosis: AKI (acute kidney injury) [facility]) [[id number]] Admitting Physician: Arlean Hopping [[id number]] Attending  Physician: Glynn Octave [[id number]]          B Medical/Surgery History Past Medical History:  Diagnosis Date   Asthma    Cannabinoid hyperemesis syndrome    HIV (human immunodeficiency virus infection) (HCC)    Pancreatitis    Substance abuse (HCC)    Past Surgical History:  Procedure Laterality Date   NO PAST SURGERIES       A IV Location/Drains/Wounds Patient Lines/Drains/Airways Status     Active Line/Drains/Airways     Name Placement date Placement time Site Days   Peripheral IV 07/18/22 20 G 1" Left Antecubital 07/18/22  1059  Antecubital  less than 1            Intake/Output Last 24 hours  Intake/Output Summary  (Last 24 hours) at 07/18/2022 1633 Last data filed at 07/18/2022 1610 Gross per 24 hour  Intake 3381.84 ml  Output 300 ml  Net 3081.84 ml    Labs/Imaging Results for orders placed or performed during the hospital encounter of 07/18/22 (from the past 48 hour(s))  Lipase, blood     Status: None   Collection Time: 07/18/22 10:56 AM  Result Value Ref Range   Lipase 38 11 - 51 U/L    Comment: Performed at [facility], [address] [place], [place] [address]  Comprehensive metabolic panel     Status: Abnormal   Collection Time: 07/18/22 10:56 AM  Result Value Ref Range   Sodium 131 (L) 135 - 145 mmol/L    Comment: ELECTROLYTES REPEATED TO VERIFY   Potassium 3.3 (L) 3.5 - 5.1 mmol/L   Chloride 91 (L) 98 - 111 mmol/L   CO2 18 (L) 22 - 32 mmol/L   Glucose, Bld 170 (H) 70 - 99 mg/dL    Comment: Glucose reference range applies only to samples taken after fasting for at least 8 hours.   BUN 45 (H) 6 - 20 mg/dL   Creatinine, Ser 1.93 (H) 0.61 - 1.24 mg/dL   Calcium 79.0 8.9 - 24.0 mg/dL   Total Protein 97.3 (H) 6.5 - 8.1 g/dL   Albumin 5.2 (H) 3.5 - 5.0 g/dL   AST 21 15 - 41 U/L   ALT 24 0 - 44 U/L   Alkaline  Phosphatase 66 38 - 126 U/L   Total Bilirubin 0.9 0.3 - 1.2 mg/dL   GFR, Estimated 16 (L) >60 mL/min    Comment: (NOTE) Calculated using the CKD-EPI Creatinine Equation (2021)    Anion gap 22 (H) 5 - 15    Comment: Performed at [facility], [address] [place], [place] [address]  CBC     Status: Abnormal   Collection Time: 07/18/22 10:56 AM  Result Value Ref Range   WBC 20.5 (H) 4.0 - 10.5 K/uL   RBC 6.17 (H) 4.22 - 5.81 MIL/uL   Hemoglobin 18.6 (H) 13.0 - 17.0 g/dL   HCT 06.2 (H) 69.4 - 85.4 %   MCV 84.8 80.0 - 100.0 fL   MCH 30.1 26.0 - 34.0 pg   MCHC 35.6 30.0 - 36.0 g/dL   RDW 62.7 03.5 - 00.9 %   Platelets 405 (H) 150 - 400 K/uL   nRBC 0.0 0.0 - 0.2 %    Comment: Performed at [facility], [address] [place], [place]  [address]  Ethanol     Status: None   Collection Time: 07/18/22 12:30 PM  Result Value Ref Range   Alcohol, Ethyl (B) <10 <10 mg/dL    Comment: (NOTE) Lowest detectable limit for serum alcohol is 10 mg/dL.  For medical purposes only. Performed at [facility], [address] [place], [place] [address]   Lactic acid, plasma     Status: Abnormal   Collection Time: 07/18/22 12:30 PM  Result Value Ref Range   Lactic Acid, Venous 2.6 (HH) 0.5 - 1.9 mmol/L    Comment: RESULT CALLED TO, READ BACK BY AND VERIFIED WITH MARVA SIMMS RN @1323  07/18/2022 OLSONM Performed at [facility], [address] [place], [place]   Lactic acid, plasma     Status: None   Collection Time: 07/18/22  3:37 PM  Result Value Ref Range   Lactic Acid, Venous 1.7 0.5 - 1.9 mmol/L    Comment: Performed at [facility], [address] [place], [place]   CT ABDOMEN PELVIS WO CONTRAST  Result Date: 07/18/2022 CLINICAL DATA:  Acute nonlocalized abdominal pain. EXAM: CT ABDOMEN AND PELVIS WITHOUT CONTRAST TECHNIQUE: Multidetector CT imaging of the abdomen and pelvis was performed following the standard protocol without IV contrast. RADIATION DOSE REDUCTION: This exam was performed according to the departmental dose-optimization program which includes automated exposure control, adjustment of the mA and/or kV according to patient size and/or use of iterative reconstruction technique. COMPARISON:  May 08, 2022 FINDINGS: Lower chest: No acute abnormality. Hepatobiliary: Unremarkable noncontrast enhanced appearance of the hepatic parenchyma. Gallbladder is unremarkable. No biliary ductal dilation. Pancreas: Pancreatic ductal dilation or evidence of acute inflammation. Spleen: No splenomegaly. Adrenals/Urinary Tract: Bilateral adrenal glands appear normal. No hydronephrosis. No renal, ureteral or bladder calculi. Urinary bladder is unremarkable for degree of distension.  Stomach/Bowel: Stomach is distended with ingested material and gas additionally of the proximal duodenum is dilated to the level of the SMA takeoff measuring up to 3.1 cm with nondistended duodenum distal to this for instance on image 38/2. Colon is predominately decompressed limiting evaluation. The appendix is within normal limits. Right-sided colonic diverticulosis without findings of acute diverticulitis. Vascular/Lymphatic: No significant vascular findings are present. No enlarged abdominal or pelvic lymph nodes. Reproductive: Prostate is unremarkable. Other: No significant abdominal free fluid. Musculoskeletal: No acute or significant osseous findings. IMPRESSION: 1. Stomach  is distended with ingested material and gas additionally of the proximal duodenum is dilated to the level of the SMA takeoff measuring up to 3.1 cm with nondistended duodenum distal to this which is more conspicuous than on prior examinations. Findings are nonspecific possibly physiologic but can be seen in the setting of SMA syndrome. 2. Right-sided colonic diverticulosis without findings of acute diverticulitis. Electronically Signed   By: Maudry Mayhew M.D.   On: 07/18/2022 13:38    Pending Labs Unresulted Labs (From admission, onward)     Start     Ordered   07/18/22 1154  Rapid urine drug screen (hospital performed)  ONCE - STAT,   STAT        07/18/22 1154   07/18/22 1052  Urinalysis, Routine w reflex microscopic Urine, Clean Catch  Once,   URGENT        07/18/22 1052            Vitals/Pain Today's Vitals   07/18/22 1330 07/18/22 1400 07/18/22 1414 07/18/22 1459  BP: (!) 170/107 (!) 150/104 (!) 150/104 (!) 166/93  Pulse: 94 88 90 95  Resp: 14 18  (!) 21  Temp:    98.8 F (37.1 C)  TempSrc:    Oral  SpO2: 100% 100%  100%  Weight:      Height:      PainSc:        Isolation Precautions No active isolations  Medications Medications  LORazepam (ATIVAN) tablet 1-4 mg (has no administration in time  range)    Or  LORazepam (ATIVAN) injection 1-4 mg (has no administration in time range)  thiamine (VITAMIN B1) tablet 100 mg ( Oral See Alternative 07/18/22 1224)    Or  thiamine (VITAMIN B1) injection 100 mg (100 mg Intravenous Given 07/18/22 1224)  folic acid (FOLVITE) tablet 1 mg (1 mg Oral Given 07/18/22 1225)  multivitamin with minerals tablet 1 tablet (1 tablet Oral Given 07/18/22 1225)  LORazepam (ATIVAN) injection 0-4 mg (2 mg Intravenous Given 07/18/22 1224)    Followed by  LORazepam (ATIVAN) injection 0-4 mg (has no administration in time range)  ondansetron (ZOFRAN) injection 4 mg (4 mg Intravenous Given by Other 07/18/22 1100)  sodium chloride 0.9 % bolus 1,000 mL (0 mLs Intravenous Stopped 07/18/22 1302)  HYDROmorphone (DILAUDID) injection 1 mg (1 mg Intravenous Given 07/18/22 1224)  sodium chloride 0.9 % bolus 1,000 mL (0 mLs Intravenous Stopped 07/18/22 1413)  sodium chloride 0.9 % bolus 1,000 mL ( Intravenous Stopped 07/18/22 1510)    Mobility walks Low fall risk   Focused Assessments Cardiac Assessment Handoff:    Lab Results  Component Value Date   CKTOTAL 340 09/06/2021   No results found for: "DDIMER" Does the Patient currently have chest pain? No    R Recommendations: See Admitting Provider Note  Report given to:   Additional Notes:

## 2022-07-18 NOTE — Progress Notes (Signed)
Plan of Care Note for accepted transfer   Patient: Carlos Guzman MRN: [id number]   DOA: 07/18/2022  Facility requesting transfer: [facility] Requesting Provider: Dr. Manus Gunning Reason for transfer: AKI on CKD 3a Facility course: 36 yo M w/ history of HIV, Hep B, cannabinoid hyperemesis syndrome, EtOH abuse, CKD 3a. Presenting with N/V x 2 days and periumbilical abdominal pain. W/u revealed lactic acid of 2.6, WBC 20.5, SCr 4.45. CT showed some distention of the proximal duodenum and questions SMA syndrome. He was started on fluids, dilaudid, and zofran. EDPA spoke with general surgery. Rec'd NGT and they will follow in consult.    Plan of care: The patient is accepted for admission to Telemetry unit, at [facility].  While holding at [facility], medical decision making responsibilities remain with the EDP. Upon arrival to [facility], [facility] will assume care. Thank you.   Author: Teddy Spike, DO 07/18/2022  Check [email or web address] for on-call coverage.  Nursing staff, Please call [facility] Admits & Consults System-Wide number on Amion as soon as patient's arrival, so appropriate admitting provider can evaluate the pt.

## 2022-07-18 NOTE — H&P (Signed)
History and Physical    Carlos Guzman VVO:[id number] DOB: May 27, 1986 DOA: 07/18/2022  PCP: Patient, No Pcp Per  Patient coming from:   I have personally briefly reviewed patient's old medical records in [facility]  Chief Complaint: n/vx 4 days  HPI: Carlos Guzman is a 36 y.o. male with medical history significant of  Asthma, cannabinoid hyperemesis syndrome,hx of pancreatitis, alcoholic gastritis ,etoh abuse, HIV, Hep B ,CKD 3a who presents to [facility] with 4 days of n/v/ periumbilical pain.  Per patient prior to onset of symptoms he did have a small amount of etoh. He also endorse cocaine use around 2-3 days ago. He notes currently he feels much improved and would like to eat. He notes no further n/v since arrival .  He denies fever/chills/ sob/ or chest pain.   ED Course:  On evaluation in ED patient was found to have AKI on on CKDIIIa and  CT abd concerning of SMA syndrome. Surgery consulted who  recommended  NGT with consult in am - patient was transferred for further care. Of note patient refused NG tube.  Per patient he was give po challenge and tolerated liquids.  Vitals: Afeb, bp 117/101 ( 151/95), hr 121, rr 20 sat 99% on ra   Labs  Wbc 20 (chronic leukocytosis), hgb 18.6,plt 405  Lipase 38  Na 131, K 3.3, CL 91 , CO2 18, gly 170, cr 4.45 (2.32) AG 22 glu 170  ETOH <10,  Lactic 2.6,1.7  UDS :+cocaine, + THC UA: + 100 protein     CT abd IMPRESSION: 1. Stomach is distended with ingested material and gas additionally of the proximal duodenum is dilated to the level of the SMA takeoff measuring up to 3.1 cm with nondistended duodenum distal to this which is more conspicuous than on prior examinations. Findings are nonspecific possibly physiologic but can be seen in the setting of SMA syndrome. 2. Right-sided colonic diverticulosis without findings of acute diverticulitis.  Tx zofran, ativan , thiamine , diluadid , mvi /folic acid, NS 1L  Review of Systems: As  per HPI otherwise 10 point review of systems negative.   Past Medical History:  Diagnosis Date   Asthma    Cannabinoid hyperemesis syndrome    HIV (human immunodeficiency virus infection) (HCC)    Pancreatitis    Substance abuse (HCC)     Past Surgical History:  Procedure Laterality Date   NO PAST SURGERIES       reports that he has been smoking cigarettes. He has been smoking an average of 1 pack per day. He has never used smokeless tobacco. He reports current alcohol use. He reports current drug use. Drugs: Marijuana and Cocaine.  Allergies  Allergen Reactions   Droperidol Other (See Comments)    Extrapyramidal reaction - medicate with benadryl first    Family History  Problem Relation Age of Onset   CAD Neg Hx    Diabetes Neg Hx     Prior to Admission medications   Medication Sig Start Date End Date Taking? Authorizing Provider  DOVATO 50-300 MG tablet Take 1 tablet by mouth daily. 07/16/21  Yes [provider]  amLODipine (NORVASC) 5 MG tablet Take 1 tablet (5 mg total) by mouth daily. Patient not taking: Reported on 07/18/2022 05/08/22   Al Decant, PA-C  metoCLOPramide (REGLAN) 10 MG tablet Take 1 tablet (10 mg total) by mouth every 6 (six) hours as needed for nausea or vomiting. Patient not taking: No sig reported 06/10/19 11/17/20  Molpus, John, MD  sucralfate (CARAFATE) 1 g tablet Take 1 tablet (1 g total) by mouth 4 (four) times daily -  with meals and at bedtime. 06/10/19 06/10/19  Molpus, Jonny Ruiz, MD    Physical Exam: Vitals:   07/18/22 1459 07/18/22 1600 07/18/22 1700 07/18/22 1952  BP: (!) 166/93 (!) 162/106 (!) 158/96 (!) 151/95  Pulse: 95 89 85 82  Resp: (!) 21 18 18 18   Temp: 98.8 F (37.1 C)  98.3 F (36.8 C) 98.3 F (36.8 C)  TempSrc: Oral  Oral Oral  SpO2: 100% 100% 100% 100%  Weight:      Height:        Constitutional: NAD, calm, comfortable Vitals:   07/18/22 1459 07/18/22 1600 07/18/22 1700 07/18/22 1952  BP: (!) 166/93 (!)  162/106 (!) 158/96 (!) 151/95  Pulse: 95 89 85 82  Resp: (!) 21 18 18 18   Temp: 98.8 F (37.1 C)  98.3 F (36.8 C) 98.3 F (36.8 C)  TempSrc: Oral  Oral Oral  SpO2: 100% 100% 100% 100%  Weight:      Height:       Eyes: PERRL, lids and conjunctivae normal ENMT: Mucous membranes are dry. Posterior pharynx clear of any exudate or lesions.Normal dentition.  Neck: normal, supple, no masses, no thyromegaly Respiratory: clear to auscultation bilaterally, no wheezing, no crackles. Normal respiratory effort. No accessory muscle use.  Cardiovascular: Regular rate and rhythm, no murmurs / rubs / gallops. No extremity edema. 2+ pedal pulses.  Abdomen: + mid abdominal tenderness, no masses palpated. No hepatosplenomegaly. Bowel sounds positive.  Musculoskeletal: no clubbing / cyanosis. No joint deformity upper and lower extremities. Good ROM, no contractures. Normal muscle tone.  Skin: no rashes, lesions, ulcers. No induration Neurologic: CN 2-12 grossly intact. Sensation intact, Strength 5/5 in all 4.  Psychiatric: Normal judgment and insight. Alert and oriented x 3. Normal mood.    Labs on Admission: I have personally reviewed following labs and imaging studies  CBC: Recent Labs  Lab 07/18/22 1056  WBC 20.5*  HGB 18.6*  HCT 52.3*  MCV 84.8  PLT 405*   Basic Metabolic Panel: Recent Labs  Lab 07/18/22 1056  NA 131*  K 3.3*  CL 91*  CO2 18*  GLUCOSE 170*  BUN 45*  CREATININE 4.45*  CALCIUM 10.1   GFR: Estimated Creatinine Clearance: 24.5 mL/min (A) (by C-G formula based on SCr of 4.45 mg/dL (H)). Liver Function Tests: Recent Labs  Lab 07/18/22 1056  AST 21  ALT 24  ALKPHOS 66  BILITOT 0.9  PROT 10.2*  ALBUMIN 5.2*   Recent Labs  Lab 07/18/22 1056  LIPASE 38   No results for input(s): "AMMONIA" in the last 168 hours. Coagulation Profile: No results for input(s): "INR", "PROTIME" in the last 168 hours. Cardiac Enzymes: No results for input(s): "CKTOTAL", "CKMB",  "CKMBINDEX", "TROPONINI" in the last 168 hours. BNP (last 3 results) No results for input(s): "PROBNP" in the last 8760 hours. HbA1C: No results for input(s): "HGBA1C" in the last 72 hours. CBG: No results for input(s): "GLUCAP" in the last 168 hours. Lipid Profile: No results for input(s): "CHOL", "HDL", "LDLCALC", "TRIG", "CHOLHDL", "LDLDIRECT" in the last 72 hours. Thyroid Function Tests: No results for input(s): "TSH", "T4TOTAL", "FREET4", "T3FREE", "THYROIDAB" in the last 72 hours. Anemia Panel: No results for input(s): "VITAMINB12", "FOLATE", "FERRITIN", "TIBC", "IRON", "RETICCTPCT" in the last 72 hours. Urine analysis:    Component Value Date/Time   COLORURINE YELLOW 07/18/2022 1610   APPEARANCEUR CLEAR  07/18/2022 1610   APPEARANCEUR Clear 11/24/2013 1333   LABSPEC 1.025 07/18/2022 1610   LABSPEC 1.030 11/24/2013 1333   PHURINE 5.5 07/18/2022 1610   GLUCOSEU NEGATIVE 07/18/2022 1610   GLUCOSEU Negative 11/24/2013 1333   HGBUR TRACE (A) 07/18/2022 1610   BILIRUBINUR SMALL (A) 07/18/2022 1610   BILIRUBINUR 1+ 11/24/2013 1333   KETONESUR NEGATIVE 07/18/2022 1610   PROTEINUR 100 (A) 07/18/2022 1610   UROBILINOGEN 1.0 01/04/2015 1651   NITRITE NEGATIVE 07/18/2022 1610   LEUKOCYTESUR NEGATIVE 07/18/2022 1610   LEUKOCYTESUR Negative 11/24/2013 1333    Radiological Exams on Admission: CT ABDOMEN PELVIS WO CONTRAST  Result Date: 07/18/2022 CLINICAL DATA:  Acute nonlocalized abdominal pain. EXAM: CT ABDOMEN AND PELVIS WITHOUT CONTRAST TECHNIQUE: Multidetector CT imaging of the abdomen and pelvis was performed following the standard protocol without IV contrast. RADIATION DOSE REDUCTION: This exam was performed according to the departmental dose-optimization program which includes automated exposure control, adjustment of the mA and/or kV according to patient size and/or use of iterative reconstruction technique. COMPARISON:  May 08, 2022 FINDINGS: Lower chest: No acute  abnormality. Hepatobiliary: Unremarkable noncontrast enhanced appearance of the hepatic parenchyma. Gallbladder is unremarkable. No biliary ductal dilation. Pancreas: Pancreatic ductal dilation or evidence of acute inflammation. Spleen: No splenomegaly. Adrenals/Urinary Tract: Bilateral adrenal glands appear normal. No hydronephrosis. No renal, ureteral or bladder calculi. Urinary bladder is unremarkable for degree of distension. Stomach/Bowel: Stomach is distended with ingested material and gas additionally of the proximal duodenum is dilated to the level of the SMA takeoff measuring up to 3.1 cm with nondistended duodenum distal to this for instance on image 38/2. Colon is predominately decompressed limiting evaluation. The appendix is within normal limits. Right-sided colonic diverticulosis without findings of acute diverticulitis. Vascular/Lymphatic: No significant vascular findings are present. No enlarged abdominal or pelvic lymph nodes. Reproductive: Prostate is unremarkable. Other: No significant abdominal free fluid. Musculoskeletal: No acute or significant osseous findings. IMPRESSION: 1. Stomach is distended with ingested material and gas additionally of the proximal duodenum is dilated to the level of the SMA takeoff measuring up to 3.1 cm with nondistended duodenum distal to this which is more conspicuous than on prior examinations. Findings are nonspecific possibly physiologic but can be seen in the setting of SMA syndrome. 2. Right-sided colonic diverticulosis without findings of acute diverticulitis. Electronically Signed   By: Maudry Mayhew M.D.   On: 07/18/2022 13:38    EKG: Independently reviewed. 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 Cannabinoid Hyperemesis syndrome -n/v / abdominal pain  - noted  Stomach is distended, proximal duodenum is dilated to the level of the SMA takeoff measuring up to 3.1 cm with nondistended duodenum distal findings nonspecific, however, concerning  for possible SMA syndrome  - NGT tube  -supportive care  -please call surgery consult in am   AKI ON CKDIIIa -due to gi losses/poor intake/ pre-renal  -hold nephrotoxic medications  - start on ivfs  -monitor UOP    Polysubstance abuse  -etoh level < 10  -place on ciwa  -UDS + cocaine/ THC -case work to referral for substance abuse on d/c  Chronic leukocytosis -consider outpatient heme f/u  -monitor labs  -of note no fever /sign of infection  -to be complete f/u on inflammatory markers  Asthma -no acute exacerbation  - prn nebs    HIV -resume anti-viral meds    Hep B -no acute issues     Prolonged QTC monitor - replete electrolytes minimize QT prolonging medication  Hypokalemia -replete prn   DVT prophylaxis: heparin Code Status: full Family Communication: none at bedside Disposition Plan: patient  expected to be admitted greater than 2 midnights  Consults called: surgery consult to be called in am  Admission status: inpatient tele   Lurline Del MD [facility]   If 7PM-7AM, please contact night-coverage [email or web address] Password [email or web address]  07/18/2022, 10:07 PM

## 2022-07-18 NOTE — ED Provider Notes (Signed)
[facility] EMERGENCY DEPARTMENT Provider Note   CSN: [id number] Arrival date & time: 07/18/22  1029     History  Chief Complaint  Patient presents with   Emesis    Carlos Guzman is a 36 y.o. male with a past medical history of asthma, pancreatitis, substance abuse, HIV presenting to the emergency department for evaluation of vomiting.  Patient states that he started to vomit 2 days ago after 2 shots of tequila.  Since then patient has been feeling nausea and vomited a couple times yesterday and today.  Denies seeing blood in his vomit.  He he reports generalized abdominal pain mostly around the umbilical area.  Admit to drinking alcohol but not as much as he used to.  Denies fever, chest pain, shortness of breath, bowel changes, urinary symptoms.   Emesis      Home Medications Prior to Admission medications   Medication Sig Start Date End Date Taking? Authorizing Provider  amLODipine (NORVASC) 5 MG tablet Take 1 tablet (5 mg total) by mouth daily. 05/08/22   Azucena Cecil, PA-C  DOVATO 50-300 MG tablet Take 1 tablet by mouth daily. 07/16/21   [provider]  omeprazole (PRILOSEC) 20 MG capsule Take 1 capsule (20 mg total) by mouth daily. 05/08/22   Azucena Cecil, PA-C  ondansetron (ZOFRAN) 4 MG tablet Take 1 tablet (4 mg total) by mouth every 6 (six) hours as needed for nausea. 11/29/21   Aline August, MD  metoCLOPramide (REGLAN) 10 MG tablet Take 1 tablet (10 mg total) by mouth every 6 (six) hours as needed for nausea or vomiting. Patient not taking: No sig reported 06/10/19 11/17/20  Molpus, Jenny Reichmann, MD  sucralfate (CARAFATE) 1 g tablet Take 1 tablet (1 g total) by mouth 4 (four) times daily -  with meals and at bedtime. 06/10/19 06/10/19  Molpus, Jenny Reichmann, MD      Allergies    Droperidol    Review of Systems   Review of Systems  Gastrointestinal:  Positive for vomiting.    Physical Exam Updated Vital Signs BP (!) 117/101 (BP Location: Left Arm)    Pulse (!) 121   Temp 98.2 F (36.8 C) (Oral)   Resp 20   Ht 6\' 1"  (1.854 m)   Wt 77.1 kg   SpO2 99%   BMI 22.43 kg/m  Physical Exam Vitals and nursing note reviewed.  Constitutional:      Appearance: Normal appearance.  HENT:     Head: Normocephalic and atraumatic.     Mouth/Throat:     Mouth: Mucous membranes are moist.  Eyes:     General: No scleral icterus. Cardiovascular:     Rate and Rhythm: Regular rhythm. Tachycardia present.     Pulses: Normal pulses.     Heart sounds: Normal heart sounds.  Pulmonary:     Effort: Pulmonary effort is normal.     Breath sounds: Normal breath sounds.  Abdominal:     General: Abdomen is flat.     Palpations: Abdomen is soft.     Tenderness: There is no abdominal tenderness.  Musculoskeletal:        General: No deformity.  Skin:    General: Skin is warm.     Findings: No rash.  Neurological:     General: No focal deficit present.     Mental Status: He is alert.  Psychiatric:        Mood and Affect: Mood normal.     ED Results / Procedures /  Treatments   Labs (all labs ordered are listed, but only abnormal results are displayed) Labs Reviewed  CBC - Abnormal; Notable for the following components:      Result Value   WBC 20.5 (*)    RBC 6.17 (*)    Hemoglobin 18.6 (*)    HCT 52.3 (*)    Platelets 405 (*)    All other components within normal limits  LIPASE, BLOOD  COMPREHENSIVE METABOLIC PANEL  URINALYSIS, ROUTINE W REFLEX MICROSCOPIC    EKG None  Radiology CT ABDOMEN PELVIS WO CONTRAST  Result Date: 07/18/2022 CLINICAL DATA:  Acute nonlocalized abdominal pain. EXAM: CT ABDOMEN AND PELVIS WITHOUT CONTRAST TECHNIQUE: Multidetector CT imaging of the abdomen and pelvis was performed following the standard protocol without IV contrast. RADIATION DOSE REDUCTION: This exam was performed according to the departmental dose-optimization program which includes automated exposure control, adjustment of the mA and/or kV  according to patient size and/or use of iterative reconstruction technique. COMPARISON:  May 08, 2022 FINDINGS: Lower chest: No acute abnormality. Hepatobiliary: Unremarkable noncontrast enhanced appearance of the hepatic parenchyma. Gallbladder is unremarkable. No biliary ductal dilation. Pancreas: Pancreatic ductal dilation or evidence of acute inflammation. Spleen: No splenomegaly. Adrenals/Urinary Tract: Bilateral adrenal glands appear normal. No hydronephrosis. No renal, ureteral or bladder calculi. Urinary bladder is unremarkable for degree of distension. Stomach/Bowel: Stomach is distended with ingested material and gas additionally of the proximal duodenum is dilated to the level of the SMA takeoff measuring up to 3.1 cm with nondistended duodenum distal to this for instance on image 38/2. Colon is predominately decompressed limiting evaluation. The appendix is within normal limits. Right-sided colonic diverticulosis without findings of acute diverticulitis. Vascular/Lymphatic: No significant vascular findings are present. No enlarged abdominal or pelvic lymph nodes. Reproductive: Prostate is unremarkable. Other: No significant abdominal free fluid. Musculoskeletal: No acute or significant osseous findings. IMPRESSION: 1. Stomach is distended with ingested material and gas additionally of the proximal duodenum is dilated to the level of the SMA takeoff measuring up to 3.1 cm with nondistended duodenum distal to this which is more conspicuous than on prior examinations. Findings are nonspecific possibly physiologic but can be seen in the setting of SMA syndrome. 2. Right-sided colonic diverticulosis without findings of acute diverticulitis. Electronically Signed   By: Maudry Mayhew M.D.   On: 07/18/2022 13:38    Procedures Procedures    Medications Ordered in ED Medications  ondansetron (ZOFRAN) injection 4 mg (has no administration in time range)    ED Course/ Medical Decision Making/ A&P                            Medical Decision Making Amount and/or Complexity of Data Reviewed Labs: ordered. Radiology: ordered.  Risk OTC drugs. Prescription drug management. Decision regarding hospitalization.   This patient presents to the ED for abdominal pain, nausea, vomiting, this involves an extensive number of treatment options, and is a complaint that carries with a high risk of complications and morbidity.  The differential diagnosis includes pancreatitis, alcohol withdrawal, cannabis hyperemesis, AKI.  This is not an exhaustive list.  Comorbidities that complicate the patient evaluation See HPI  Social determinants of health NA  Additional history obtained: Additional history obtained from EMR. External records from outside source obtained and review including prior labs  Cardiac monitoring/EKG: The patient was maintained on a cardiac monitor.  I personally reviewed and interpreted the cardiac monitor which showed an underlying rhythm of: Sinus rhythm.  Lab tests: I ordered and personally interpreted labs.  The pertinent results include Leukocytosis 20.5  Hbg normal. Platelets normal. No electrolyte abnormalities noted. BUN 45, creatinine 4.45, GFR 16, anion gap 22. No transaminitis.  Imaging studies: I ordered imaging studies including CT abdomen pelvis which showed  1. Stomach is distended with ingested material and gas additionally of the proximal duodenum is dilated to the level of the SMA takeoff measuring up to 3.1 cm with nondistended duodenum distal to this which is more conspicuous than on prior examinations. Findings are nonspecific possibly physiologic but can be seen in the setting of SMA syndrome. 2. Right-sided colonic diverticulosis without findings of acute diverticulitis..  I personally reviewed, interpreted imaging and agree with the radiologist's interpretations.  Problem list/ ED course/ Critical interventions/ Medical management: HPI: See  above Vital signs blood pressure 140/96 otherwise within normal range and stable throughout visit. Laboratory/imaging studies significant for: See above. On physical examination, patient is afebrile and appears in no acute distress.  There was tenderness to palpation to all abdominal quadrants.  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  I consulted general surgery who recommended NG tube placement before admission.  I have talked the patient and he agree with the plan.  However patient declined to have an NG tube inserted during the procedure due to discomfort.  Labs with positive cocaine and marijuana.  BUN 45, creatinine 4.45.  White count 20.5.  Lactic acid 2.6.  Patient's clinical presentations and laboratory/imaging studies are most concerned for AKI versus pancreatitis versus SMA.  Admission is required for management of SMA and AKI.  Zofran, Dilaudid, IV fluid ordered. Reevaluation of the patient after these medications showed that the patient improved.   I have reviewed the patient home medicines and have made adjustments as needed.  Consultations obtained: I requested consultation with general surgery, and discussed lab and imaging findings as well as pertinent plan.  They recommend NG tube placement.  I requested consultation with hospitalist Dr.Kyle and discussed labs and imaging findings as well as pertinent plan.  He recommended admission.  Disposition Patient will be admitted to the hospital. This chart was dictated using voice recognition software.  Despite best efforts to proofread,  errors can occur which can change the documentation meaning.          Final Clinical Impression(s) / ED Diagnoses Final diagnoses:  AKI (acute kidney injury) ([facility])  Nausea and vomiting, unspecified vomiting type    Rx / DC Orders ED Discharge Orders     None         Rex Kras, [place] 07/19/22 [id number]    Ezequiel Essex, MD 07/19/22 1458

## 2022-07-18 NOTE — Progress Notes (Signed)
Patient arrived to room 1444. Telemetry setup for pt & verified. A/Ox4 see flowshet for VS. RA   CIWA protocol initiated see flowsheet for scores. Treated with ativan as ordered. Patient oriented to room & bed alarm on. TRH notified via amion.

## 2022-07-18 NOTE — ED Notes (Signed)
Pt provided with grape juice with provider approval. Again made aware that we need a urine specimen.

## 2022-07-18 NOTE — ED Notes (Signed)
PT refused NG tube placement. Pt educated on the medical benefits of NG placement and verbalized complete understanding. Pt states "I don't want a tube in in my nose." Provider notified of same.

## 2022-07-19 ENCOUNTER — Observation Stay (HOSPITAL_COMMUNITY): Payer: Medicaid Other

## 2022-07-19 DIAGNOSIS — N179 Acute kidney failure, unspecified: Secondary | ICD-10-CM | POA: Diagnosis not present

## 2022-07-19 DIAGNOSIS — K2289 Other specified disease of esophagus: Secondary | ICD-10-CM | POA: Diagnosis not present

## 2022-07-19 LAB — COMPREHENSIVE METABOLIC PANEL
ALT: 17 U/L (ref 0–44)
AST: 17 U/L (ref 15–41)
Albumin: 3.8 g/dL (ref 3.5–5.0)
Alkaline Phosphatase: 46 U/L (ref 38–126)
Anion gap: 10 (ref 5–15)
BUN: 38 mg/dL — ABNORMAL HIGH (ref 6–20)
CO2: 24 mmol/L (ref 22–32)
Calcium: 8.6 mg/dL — ABNORMAL LOW (ref 8.9–10.3)
Chloride: 99 mmol/L (ref 98–111)
Creatinine, Ser: 2.22 mg/dL — ABNORMAL HIGH (ref 0.61–1.24)
GFR, Estimated: 38 mL/min — ABNORMAL LOW (ref >60)
Glucose, Bld: 123 mg/dL — ABNORMAL HIGH (ref 70–99)
Potassium: 2.7 mmol/L — CL (ref 3.5–5.1)
Sodium: 133 mmol/L — ABNORMAL LOW (ref 135–145)
Total Bilirubin: 1.2 mg/dL (ref 0.3–1.2)
Total Protein: 7.7 g/dL (ref 6.5–8.1)

## 2022-07-19 LAB — BASIC METABOLIC PANEL
Anion gap: 9 (ref 5–15)
BUN: 29 mg/dL — ABNORMAL HIGH (ref 6–20)
CO2: 25 mmol/L (ref 22–32)
Calcium: 8.9 mg/dL (ref 8.9–10.3)
Chloride: 99 mmol/L (ref 98–111)
Creatinine, Ser: 1.58 mg/dL — ABNORMAL HIGH (ref 0.61–1.24)
GFR, Estimated: 57 mL/min — ABNORMAL LOW (ref >60)
Glucose, Bld: 94 mg/dL (ref 70–99)
Potassium: 3.5 mmol/L (ref 3.5–5.1)
Sodium: 133 mmol/L — ABNORMAL LOW (ref 135–145)

## 2022-07-19 LAB — CBC
HCT: 42.9 % (ref 39.0–52.0)
Hemoglobin: 14.9 g/dL (ref 13.0–17.0)
MCH: 30.4 pg (ref 26.0–34.0)
MCHC: 34.7 g/dL (ref 30.0–36.0)
MCV: 87.6 fL (ref 80.0–100.0)
Platelets: 298 10*3/uL (ref 150–400)
RBC: 4.9 MIL/uL (ref 4.22–5.81)
RDW: 13.4 % (ref 11.5–15.5)
WBC: 14.8 10*3/uL — ABNORMAL HIGH (ref 4.0–10.5)
nRBC: 0 % (ref 0.0–0.2)

## 2022-07-19 LAB — TSH: TSH: 0.189 u[IU]/mL — ABNORMAL LOW (ref 0.350–4.500)

## 2022-07-19 LAB — MAGNESIUM: Magnesium: 2.4 mg/dL (ref 1.7–2.4)

## 2022-07-19 LAB — GLUCOSE, CAPILLARY: Glucose-Capillary: 99 mg/dL (ref 70–99)

## 2022-07-19 LAB — HEMOGLOBIN A1C
Hgb A1c MFr Bld: 5.4 % (ref 4.8–5.6)
Mean Plasma Glucose: 108.28 mg/dL

## 2022-07-19 LAB — LIPASE, BLOOD: Lipase: 35 U/L (ref 11–51)

## 2022-07-19 MED ORDER — POTASSIUM CHLORIDE 10 MEQ/100ML IV SOLN
10.0000 meq | INTRAVENOUS | Status: AC
  Administered 2022-07-19 (×6): 10 meq via INTRAVENOUS
  Filled 2022-07-19 (×6): qty 100

## 2022-07-19 MED ORDER — NICOTINE 21 MG/24HR TD PT24
21.0000 mg | MEDICATED_PATCH | Freq: Every day | TRANSDERMAL | Status: DC
  Administered 2022-07-19: 21 mg via TRANSDERMAL
  Filled 2022-07-19: qty 1

## 2022-07-19 NOTE — Progress Notes (Signed)
AVS given to patient and explained at the bedside. Medications and follow up appointments have been explained with pt verbalizing understanding.  

## 2022-07-19 NOTE — Progress Notes (Signed)
CRITICAL VALUE STICKER  CRITICAL VALUE: K+  RECEIVER (on-site recipient of call): Jiles Prows, RN  DATE & TIME NOTIFIED: 07/19/22 @ 0234  MESSENGER (representative from lab):Vazquez J.  MD NOTIFIED: Jim Desanctis  TIME OF NOTIFICATION: 0240  RESPONSE: see new orders

## 2022-07-19 NOTE — TOC Initial Note (Signed)
Transition of Care [facility]) - Initial/Assessment Note    Patient Details  Name: Carlos Guzman MRN: [id number] Date of Birth: 1986/08/28  [facility] [place] CM/SW Contact:    Golda Acre, RN Phone Number: 07/19/2022, 7:45 AM  Clinical Narrative:                 Substance abuse resources placed on dc instructions.  Following for tocs.  Expected Discharge Plan: Home/Self Care Barriers to Discharge: Continued Medical Work up   Patient Goals and CMS Choice Patient states their goals for this hospitalization and ongoing recovery are:: to go home CMS Medicare.gov Compare Post Acute Care list provided to:: Patient    Expected Discharge Plan and Services Expected Discharge Plan: Home/Self Care   Discharge Planning Services: CM Consult   Living arrangements for the past 2 months: Apartment                                      Prior Living Arrangements/Services Living arrangements for the past 2 months: Apartment Lives with:: Self Patient language and need for interpreter reviewed:: Yes Do you feel safe going back to the place where you live?: Yes            Criminal Activity/Legal Involvement Pertinent to Current Situation/Hospitalization: No - Comment as needed  Activities of Daily Living      Permission Sought/Granted                  Emotional Assessment Appearance:: Appears stated age Attitude/Demeanor/Rapport: Engaged Affect (typically observed): Calm Orientation: : Oriented to Self, Oriented to Place, Oriented to  Time, Oriented to Situation Alcohol / Substance Use: Tobacco Use, Alcohol Use, Illicit Drugs (current smoker, etoh intake and marijuana and coccaine use.) Psych Involvement: No (comment)  Admission diagnosis:  AKI (acute kidney injury) (HCC) [N17.9] 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   Acute kidney failure (HCC) 10/24/2021   Intractable  nausea and vomiting 09/06/2021   CKD (chronic kidney disease), stage III (HCC) 09/06/2021   Prolonged QT interval 09/06/2021   Cocaine abuse (HCC) 09/06/2021   ARF (acute renal failure) (HCC) 07/29/2019   Hyperkalemia 07/29/2019   Left upper quadrant abdominal pain 07/29/2019   Metabolic acidosis, increased anion gap 07/29/2019   Hyperbilirubinemia 07/29/2019   Hyponatremia 07/29/2019   Rhabdomyolysis 07/24/2019   AKI (acute kidney injury) (HCC) 07/23/2019   HIV (human immunodeficiency virus infection) (HCC) 07/23/2019   Cannabinoid hyperemesis syndrome 07/23/2019   Hypokalemia 07/23/2019   Leukocytosis 07/23/2019   Tobacco abuse 07/23/2019   Alcohol abuse, 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

## 2022-07-19 NOTE — Consult Note (Signed)
Surgical Evaluation Requesting provider: Jeanelle Malling PA-C  Chief Complaint: Vomiting  HPI: 36 year old male with a history of HIV, pancreatitis, substance abuse, asthma who presented to the med center at [facility] yesterday with a 2-day history of vomiting, after taking some shots of tequila.  Prior to this, he was eating a regular diet, feeling well, and denies any recent weight loss at all.  Imaging was concerning for possible SMA syndrome.  He was transferred here for further evaluation.  He reports he has had no further nausea or vomiting since yesterday evening and he is very hungry.  No previous abdominal surgeries.  Allergies  Allergen Reactions   Droperidol Other (See Comments)    Extrapyramidal reaction - medicate with benadryl first    Past Medical History:  Diagnosis Date   Asthma    Cannabinoid hyperemesis syndrome    HIV (human immunodeficiency virus infection) (HCC)    Pancreatitis    Substance abuse (HCC)     Past Surgical History:  Procedure Laterality Date   NO PAST SURGERIES      Family History  Problem Relation Age of Onset   CAD Neg Hx    Diabetes Neg Hx     Social History   Socioeconomic History   Marital status: Single    Spouse name: Not on file   Number of children: Not on file   Years of education: Not on file   Highest education level: Not on file  Occupational History   Occupation: manual labor  Tobacco Use   Smoking status: Every Day    Packs/day: 1.00    Types: Cigarettes   Smokeless tobacco: Never  Vaping Use   Vaping Use: Never used  Substance and Sexual Activity   Alcohol use: Yes   Drug use: Yes    Types: Marijuana, Cocaine   Sexual activity: Yes  Other Topics Concern   Not on file  Social History Narrative   Not on file   Social Determinants of Health   Financial Resource Strain: Not on file  Food Insecurity: Not on file  Transportation Needs: Not on file  Physical Activity: Not on file  Stress: Not on file  Social  Connections: Not on file    No current facility-administered medications on file prior to encounter.   Current Outpatient Medications on File Prior to Encounter  Medication Sig Dispense Refill   DOVATO 50-300 MG tablet Take 1 tablet by mouth daily.     amLODipine (NORVASC) 5 MG tablet Take 1 tablet (5 mg total) by mouth daily. (Patient not taking: Reported on 07/18/2022) 30 tablet 0   [DISCONTINUED] metoCLOPramide (REGLAN) 10 MG tablet Take 1 tablet (10 mg total) by mouth every 6 (six) hours as needed for nausea or vomiting. (Patient not taking: No sig reported) 12 tablet 0   [DISCONTINUED] sucralfate (CARAFATE) 1 g tablet Take 1 tablet (1 g total) by mouth 4 (four) times daily -  with meals and at bedtime. 40 tablet 0    Review of Systems: a complete, 10pt review of systems was completed with pertinent positives and negatives as documented in the HPI  Physical Exam: Vitals:   07/19/22 0023 07/19/22 0431  BP: 138/79 (!) 140/96  Pulse: 69 80  Resp: 18 18  Temp: 97.9 F (36.6 C) 97.8 F (36.6 C)  SpO2: 100% 100%   Alert, well-appearing Unlabored respirations Abdomen is soft, nontender, nondistended.  No surgical scars.      Latest Ref Rng & Units 07/19/2022  12:57 AM 07/18/2022   10:56 AM 05/08/2022   10:05 AM  CBC  WBC 4.0 - 10.5 K/uL 14.8  20.5  20.2   Hemoglobin 13.0 - 17.0 g/dL 17.4  08.1  44.8   Hematocrit 39.0 - 52.0 % 42.9  52.3  55.4   Platelets 150 - 400 K/uL 298  405  348        Latest Ref Rng & Units 07/19/2022   12:57 AM 07/18/2022   10:56 AM 05/08/2022   10:05 AM  CMP  Glucose 70 - 99 mg/dL 185  631  497   BUN 6 - 20 mg/dL 38  45  40   Creatinine 0.61 - 1.24 mg/dL 0.26  3.78  5.88   Sodium 135 - 145 mmol/L 133  131  132   Potassium 3.5 - 5.1 mmol/L 2.7  3.3  3.4   Chloride 98 - 111 mmol/L 99  91  93   CO2 22 - 32 mmol/L 24  18  22    Calcium 8.9 - 10.3 mg/dL 8.6   50.2   Total Protein 6.5 - 8.1 g/dL 7.7  77.4    Total Bilirubin 0.3 - 1.2 mg/dL 1.2   0.9    Alkaline Phos 38 - 126 U/L 46  66    AST 15 - 41 U/L 17  21    ALT 0 - 44 U/L 17  24      No results found for: "INR", "PROTIME"  Imaging: CT ABDOMEN PELVIS WO CONTRAST  Result Date: 07/18/2022 CLINICAL DATA:  Acute nonlocalized abdominal pain. EXAM: CT ABDOMEN AND PELVIS WITHOUT CONTRAST TECHNIQUE: Multidetector CT imaging of the abdomen and pelvis was performed following the standard protocol without IV contrast. RADIATION DOSE REDUCTION: This exam was performed according to the departmental dose-optimization program which includes automated exposure control, adjustment of the mA and/or kV according to patient size and/or use of iterative reconstruction technique. COMPARISON:  May 08, 2022 FINDINGS: Lower chest: No acute abnormality. Hepatobiliary: Unremarkable noncontrast enhanced appearance of the hepatic parenchyma. Gallbladder is unremarkable. No biliary ductal dilation. Pancreas: Pancreatic ductal dilation or evidence of acute inflammation. Spleen: No splenomegaly. Adrenals/Urinary Tract: Bilateral adrenal glands appear normal. No hydronephrosis. No renal, ureteral or bladder calculi. Urinary bladder is unremarkable for degree of distension. Stomach/Bowel: Stomach is distended with ingested material and gas additionally of the proximal duodenum is dilated to the level of the SMA takeoff measuring up to 3.1 cm with nondistended duodenum distal to this for instance on image 38/2. Colon is predominately decompressed limiting evaluation. The appendix is within normal limits. Right-sided colonic diverticulosis without findings of acute diverticulitis. Vascular/Lymphatic: No significant vascular findings are present. No enlarged abdominal or pelvic lymph nodes. Reproductive: Prostate is unremarkable. Other: No significant abdominal free fluid. Musculoskeletal: No acute or significant osseous findings. IMPRESSION: 1. Stomach is distended with ingested material and gas additionally of the  proximal duodenum is dilated to the level of the SMA takeoff measuring up to 3.1 cm with nondistended duodenum distal to this which is more conspicuous than on prior examinations. Findings are nonspecific possibly physiologic but can be seen in the setting of SMA syndrome. 2. Right-sided colonic diverticulosis without findings of acute diverticulitis. Electronically Signed   By: May 10, 2022 M.D.   On: 07/18/2022 13:38     A/P: 2-day history of emesis, and imaging demonstrates some compression of the duodenum at the level of the SMA.  I have reviewed his imaging personally.  This is not dissimilar  from a previous CT scan that he had at the end of August, although at that time there was not distention of the duodenum and stomach proximally.  As he was asymptomatic until 2 days ago, he has had no precedent weight loss I do not think that this is SMA syndrome although anatomically there is some compression.  Upper GI from this morning reviewed with transition of contrast beyond the ligament of Treitz to suggest no obstruction here.  Okay to advance to clear liquid diet.  If he is tolerating liquids, would be okay from my standpoint to discharge him this evening to slowly advance his diet at home.    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   Acute kidney failure (HCC) 10/24/2021   Intractable nausea and vomiting 09/06/2021   CKD (chronic kidney disease), stage III (HCC) 09/06/2021   Prolonged QT interval 09/06/2021   Cocaine abuse (HCC) 09/06/2021   ARF (acute renal failure) (HCC) 07/29/2019   Hyperkalemia 07/29/2019   Left upper quadrant abdominal pain 07/29/2019   Metabolic acidosis, increased anion gap 07/29/2019   Hyperbilirubinemia 07/29/2019   Hyponatremia 07/29/2019   Rhabdomyolysis 07/24/2019   AKI (acute kidney injury) (HCC) 07/23/2019   HIV (human immunodeficiency virus infection) (HCC) 07/23/2019    Cannabinoid hyperemesis syndrome 07/23/2019   Hypokalemia 07/23/2019   Leukocytosis 07/23/2019   Tobacco abuse 07/23/2019   Alcohol abuse, in remission 07/23/2019       Phylliss Blakes, MD [facility]  See Loretha Stapler to contact appropriate on-call provider

## 2022-07-20 NOTE — Discharge Summary (Signed)
Physician Discharge Summary   Patient: Carlos Guzman MRN: [id number] DOB: 08-Apr-1986  Admit date:     07/18/2022  Discharge date: 07/19/2022  Discharge Physician: Briant CedarNkeiruka J Rion Schnitzer   PCP: Patient, No Pcp Per   Recommendations at discharge:   Follow-up with PCP in 1 week   Discharge Diagnoses: Principal Problem:   AKI (acute kidney injury) [facility](HCC)    Hospital Course: Carlos Guzman is a 36 y.o. male with medical history significant of  Asthma, cannabinoid hyperemesis syndrome,hx of pancreatitis, alcoholic gastritis ,etoh abuse, HIV, Hep B ,CKD 3a who presents to [facility] with 4 days of n/v/ periumbilical pain.  Per patient prior to onset of symptoms he did have a small amount of etoh. He also endorse cocaine use around 2-3 days ago. He notes currently he feels much improved and would like to eat. He notes no further n/v since arrival. In the ED patient was found to have AKI on on CKDIIIa and  CT abd concerning of SMA syndrome. Surgery consulted who recommended advance diet and if tolerating discharge as SMA syndrome unlikely.   Assessment and Plan:  Likely Cannabinoid Hyperemesis syndrome Unlikely SMA syndrome Proximal duodenum is dilated to the level of the SMA takeoff measuring up to 3.1 cm with nondistended duodenum distal findings nonspecific, however, concerning for possible SMA syndrome  Surgery consulted who recommended advance diet and if tolerating discharge as SMA syndrome unlikely   AKI on CKD IIIa Creatinine back to baseline s/p IV fluids  Hypokalemia Replaced as needed  Chronic leukocytosis No fever /sign of infection   Polysubstance abuse  Etoh level < 10  S/P CIWA UDS: positive for cocaine/ [facility] Case worker for substance abuse on d/c   Asthma No acute exacerbation    HIV Continue anti-viral meds    Hep B No acute issues    Prolonged QTC monitor            Pain control - [place] Controlled Substance Reporting System database was reviewed.  and patient was instructed, not to drive, operate heavy machinery, perform activities at heights, swimming or participation in water activities or provide baby-sitting services while on Pain, Sleep and Anxiety Medications; until their outpatient Physician has advised to do so again. Also recommended to not to take more than prescribed Pain, Sleep and Anxiety Medications.    Consultants: General surgery Procedures performed: None Disposition: Home Diet recommendation:  Regular diet   DISCHARGE MEDICATION: Allergies as of 07/19/2022       Reactions   Droperidol Other (See Comments)   Extrapyramidal reaction - medicate with benadryl first        Medication List     TAKE these medications    amLODipine 5 MG tablet Commonly known as: NORVASC Take 1 tablet (5 mg total) by mouth daily.   Dovato 50-300 MG tablet Generic drug: dolutegravir-lamiVUDine Take 1 tablet by mouth daily.        Discharge Exam: Filed Weights   07/18/22 1048  Weight: 77.1 kg   General: NAD  Cardiovascular: S1, S2 present Respiratory: CTAB Abdomen: Soft, nontender, nondistended, bowel sounds present Musculoskeletal: No bilateral pedal edema noted Skin: Normal Psychiatry: Normal mood   Condition at discharge: good  The results of significant diagnostics from this hospitalization (including imaging, microbiology, ancillary and laboratory) are listed below for reference.   Imaging Studies: DG UGI W SINGLE CM (SOL OR THIN BA)  Result Date: 07/19/2022 CLINICAL DATA:  Top EXAM: DG UGI W SINGLE CM TECHNIQUE: Scout radiograph was obtained.  Single contrast examination was performed using thin liquid barium. This exam was performed by APP name, and was supervised and interpreted by Rad name. FLUOROSCOPY: Radiation Exposure Index (as provided by the fluoroscopic device): 39.40 mGy Kerma COMPARISON:  CT 07/18/22. FINDINGS: Scout Radiograph: Non-obstructive bowel gas pattern, non-distended stomach Esophagus:   Normal appearance. Esophageal motility:  Within normal limits. Stomach: No gross abnormality identified. Gastric emptying: Sluggish, but not position dependent. Duodenum: Traverses retroperitoneally, crosses midline, ligament of trietz in expected position, no filling defect. Other:  None. IMPRESSION: No evidence for SMA syndrome Normal esophagus, duodenum, and stomach. Electronically Signed   By: Genevive Bi M.D.   On: 07/19/2022 12:40   CT ABDOMEN PELVIS WO CONTRAST  Result Date: 07/18/2022 CLINICAL DATA:  Acute nonlocalized abdominal pain. EXAM: CT ABDOMEN AND PELVIS WITHOUT CONTRAST TECHNIQUE: Multidetector CT imaging of the abdomen and pelvis was performed following the standard protocol without IV contrast. RADIATION DOSE REDUCTION: This exam was performed according to the departmental dose-optimization program which includes automated exposure control, adjustment of the mA and/or kV according to patient size and/or use of iterative reconstruction technique. COMPARISON:  May 08, 2022 FINDINGS: Lower chest: No acute abnormality. Hepatobiliary: Unremarkable noncontrast enhanced appearance of the hepatic parenchyma. Gallbladder is unremarkable. No biliary ductal dilation. Pancreas: Pancreatic ductal dilation or evidence of acute inflammation. Spleen: No splenomegaly. Adrenals/Urinary Tract: Bilateral adrenal glands appear normal. No hydronephrosis. No renal, ureteral or bladder calculi. Urinary bladder is unremarkable for degree of distension. Stomach/Bowel: Stomach is distended with ingested material and gas additionally of the proximal duodenum is dilated to the level of the SMA takeoff measuring up to 3.1 cm with nondistended duodenum distal to this for instance on image 38/2. Colon is predominately decompressed limiting evaluation. The appendix is within normal limits. Right-sided colonic diverticulosis without findings of acute diverticulitis. Vascular/Lymphatic: No significant vascular findings  are present. No enlarged abdominal or pelvic lymph nodes. Reproductive: Prostate is unremarkable. Other: No significant abdominal free fluid. Musculoskeletal: No acute or significant osseous findings. IMPRESSION: 1. Stomach is distended with ingested material and gas additionally of the proximal duodenum is dilated to the level of the SMA takeoff measuring up to 3.1 cm with nondistended duodenum distal to this which is more conspicuous than on prior examinations. Findings are nonspecific possibly physiologic but can be seen in the setting of SMA syndrome. 2. Right-sided colonic diverticulosis without findings of acute diverticulitis. Electronically Signed   By: Maudry Mayhew M.D.   On: 07/18/2022 13:38    Microbiology: Results for orders placed or performed during the hospital encounter of 10/24/21  Resp Panel by RT-PCR (Flu A&B, Covid) Nasopharyngeal Swab     Status: None   Collection Time: 10/24/21  7:57 PM   Specimen: Nasopharyngeal Swab; Nasopharyngeal(NP) swabs in vial transport medium  Result Value Ref Range Status   SARS Coronavirus 2 by RT PCR NEGATIVE NEGATIVE Final    Comment: (NOTE) SARS-CoV-2 target nucleic acids are NOT DETECTED.  The SARS-CoV-2 RNA is generally detectable in upper respiratory specimens during the acute phase of infection. The lowest concentration of SARS-CoV-2 viral copies this assay can detect is 138 copies/mL. A negative result does not preclude SARS-Cov-2 infection and should not be used as the sole basis for treatment or other patient management decisions. A negative result may occur with  improper specimen collection/handling, submission of specimen other than nasopharyngeal swab, presence of viral mutation(s) within the areas targeted by this assay, and inadequate number of viral copies(<138 copies/mL). A negative result must  be combined with clinical observations, patient history, and epidemiological information. The expected result is Negative.  Fact  Sheet for Patients:  BloggerCourse.com  Fact Sheet for Healthcare Providers:  SeriousBroker.it  This test is no t yet approved or cleared by the [place] FDA and  has been authorized for detection and/or diagnosis of SARS-CoV-2 by FDA under an Emergency Use Authorization (EUA). This EUA will remain  in effect (meaning this test can be used) for the duration of the COVID-19 declaration under Section 564(b)(1) of the Act, 21 U.S.C.section 360bbb-3(b)(1), unless the authorization is terminated  or revoked sooner.       Influenza A by PCR NEGATIVE NEGATIVE Final   Influenza B by PCR NEGATIVE NEGATIVE Final    Comment: (NOTE) The Xpert Xpress SARS-CoV-2/FLU/RSV plus assay is intended as an aid in the diagnosis of influenza from Nasopharyngeal swab specimens and should not be used as a sole basis for treatment. Nasal washings and aspirates are unacceptable for Xpert Xpress SARS-CoV-2/FLU/RSV testing.  Fact Sheet for Patients: BloggerCourse.com  Fact Sheet for Healthcare Providers: SeriousBroker.it  This test is not yet approved or cleared by the [place] FDA and has been authorized for detection and/or diagnosis of SARS-CoV-2 by FDA under an Emergency Use Authorization (EUA). This EUA will remain in effect (meaning this test can be used) for the duration of the COVID-19 declaration under Section 564(b)(1) of the Act, 21 U.S.C. section 360bbb-3(b)(1), unless the authorization is terminated or revoked.  Performed at [facility], [address] [place], [place] [address]   Blood culture (routine x 2)     Status: None   Collection Time: 10/24/21  8:02 PM   Specimen: BLOOD  Result Value Ref Range Status   Specimen Description   Final    BLOOD RIGHT ANTECUBITAL Performed at [facility], [address] [place], [place] [address]    Special Requests    Final    BOTTLES DRAWN AEROBIC AND ANAEROBIC Blood Culture adequate volume Performed at [facility], [address] [place], [place] [address]    Culture   Final    NO GROWTH 5 DAYS Performed at [facility], [address], [place], [place] [address]    Report Status 10/29/2021 FINAL  Final  Blood culture (routine x 2)     Status: None   Collection Time: 10/24/21  8:10 PM   Specimen: BLOOD  Result Value Ref Range Status   Specimen Description   Final    BLOOD LEFT ANTECUBITAL Performed at [facility], [address] [place], [place] [address]    Special Requests   Final    BOTTLES DRAWN AEROBIC AND ANAEROBIC Blood Culture adequate volume Performed at [facility], [address] [place], [place] [address]    Culture   Final    NO GROWTH 5 DAYS Performed at [facility], [address], [place], [place] [address]    Report Status 10/29/2021 FINAL  Final    Labs: CBC: Recent Labs  Lab 07/18/22 1056 07/19/22 0057  WBC 20.5* 14.8*  HGB 18.6* 14.9  HCT 52.3* 42.9  MCV 84.8 87.6  PLT 405* 298   Basic Metabolic Panel: Recent Labs  Lab 07/18/22 1056 07/19/22 0057 07/19/22 1601  NA 131* 133* 133*  K 3.3* 2.7* 3.5  CL 91* 99 99  CO2 18* 24 25  GLUCOSE 170* 123* 94  BUN 45*  38* 29*  CREATININE 4.45* 2.22* 1.58*  CALCIUM 10.1 8.6* 8.9  MG  --  2.4  --    Liver Function Tests: Recent Labs  Lab 07/18/22 1056 07/19/22 0057  AST 21 17  ALT 24 17  ALKPHOS 66 46  BILITOT 0.9 1.2  PROT 10.2* 7.7  ALBUMIN 5.2* 3.8   CBG: Recent Labs  Lab 07/19/22 0742  GLUCAP 99    Discharge time spent: less than 30 minutes.  Signed: Briant Cedar, MD [facility] 07/20/2022

## 2022-08-12 IMAGING — CT CT ABD-PELV W/ CM
2 of 5 series · 16 of 46 positions shown, 18 images · IV contrast (Omnipaque)
Comparison: CT without contrast 07/01/2021 and CT with contrast
03/26/2021

CLINICAL DATA: Abdomen and flank pain.

EXAM:
CT ABDOMEN AND PELVIS WITH CONTRAST
TECHNIQUE: Multidetector CT imaging of the abdomen and pelvis was performed
using the standard protocol following bolus administration of
intravenous contrast.
CONTRAST:  100mL OMNIPAQUE IOHEXOL 300 MG/ML  SOLN

[Series 2: axial st · axial · 0.73mm/px · z∈[+690,+1090]mm · 13 of 90 slices shown, 15 images]
[im 5/90  soft-tissue]
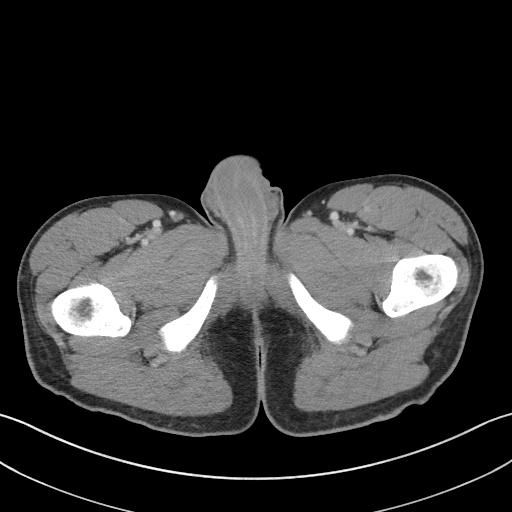
[im 5/90  bone]
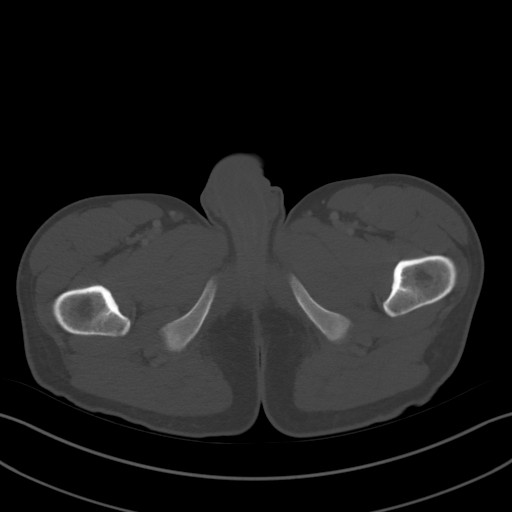
[im 14/90  soft-tissue]
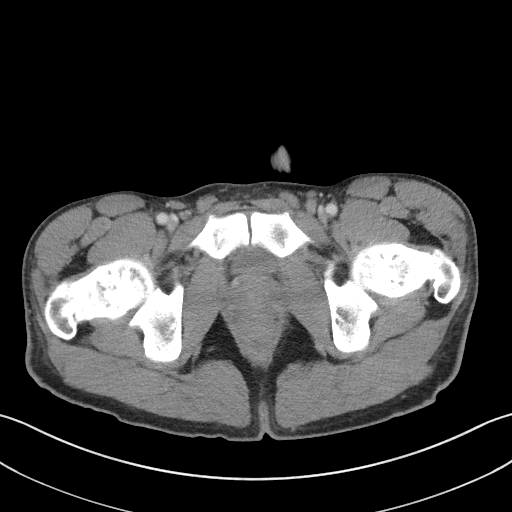
[im 18/90  soft-tissue]
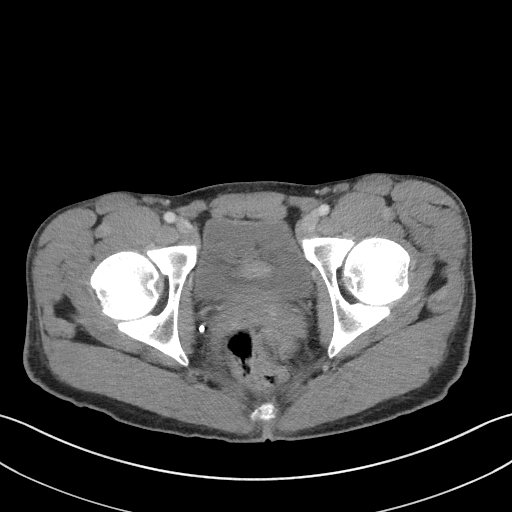
[im 27/90  soft-tissue]
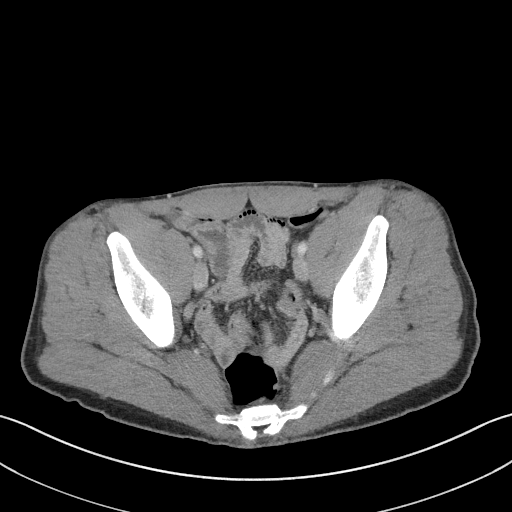
[im 32/90  soft-tissue]
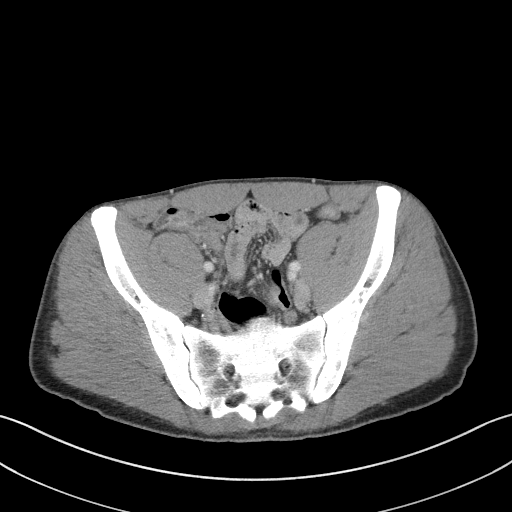
[im 41/90  soft-tissue]
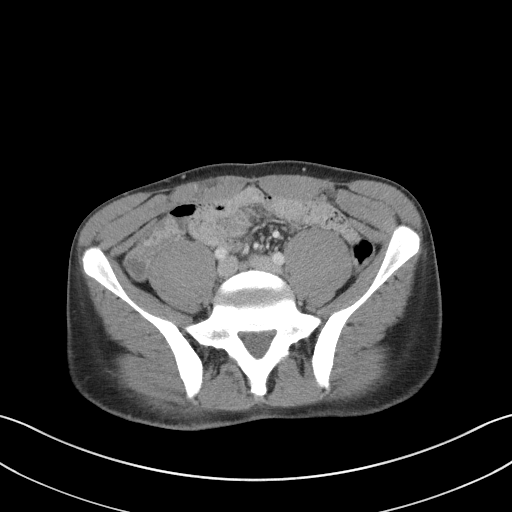
[im 45/90  soft-tissue]
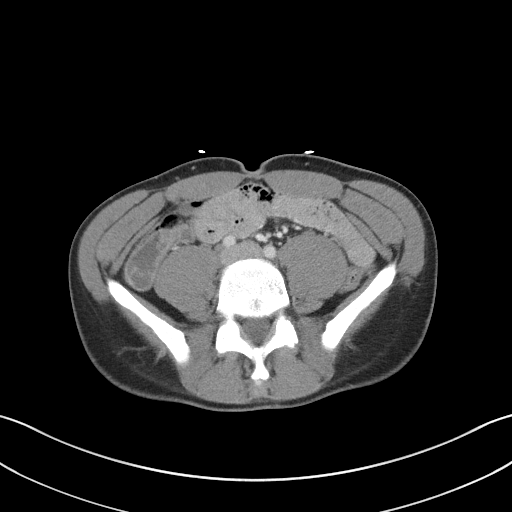
[im 49/90  soft-tissue]
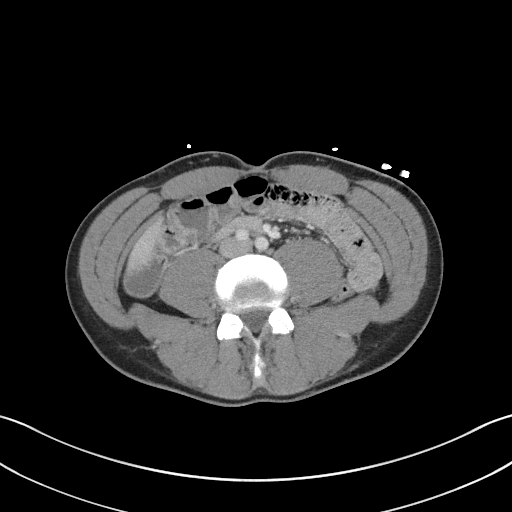
[im 58/90  soft-tissue]
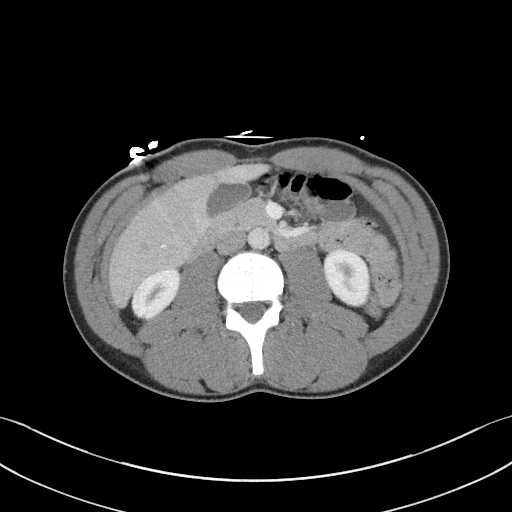
[im 58/90  bone]
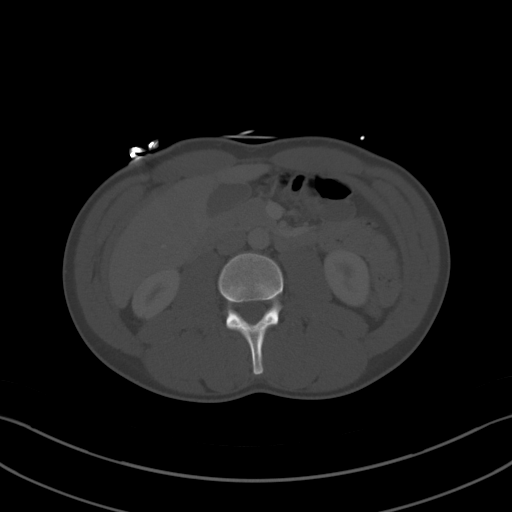
[im 63/90  soft-tissue]
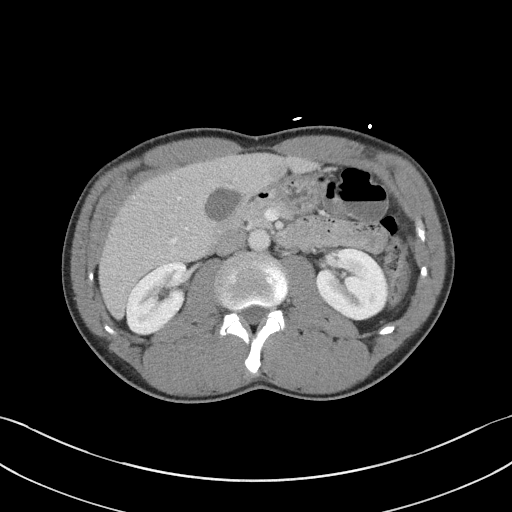
[im 72/90  soft-tissue]
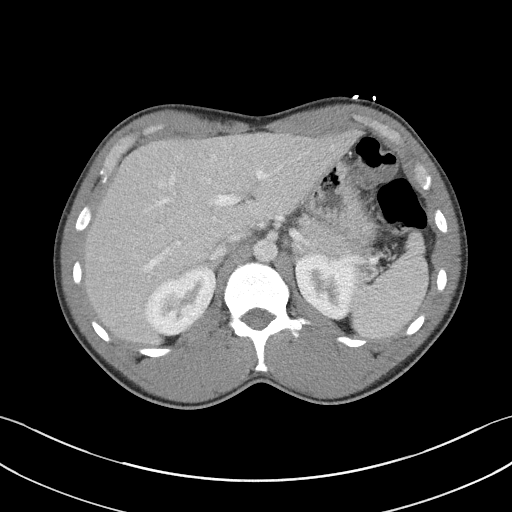
[im 76/90  soft-tissue]
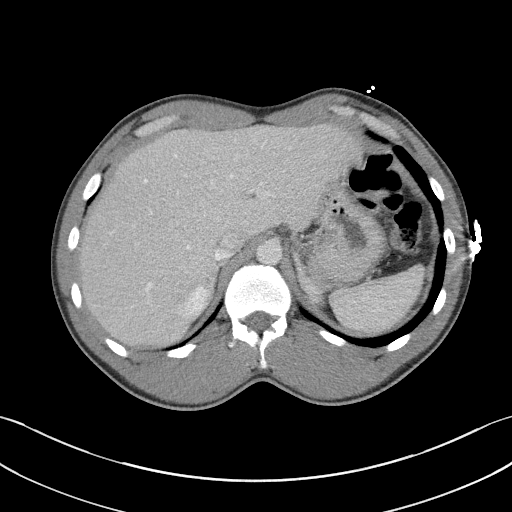
[im 85/90  soft-tissue]
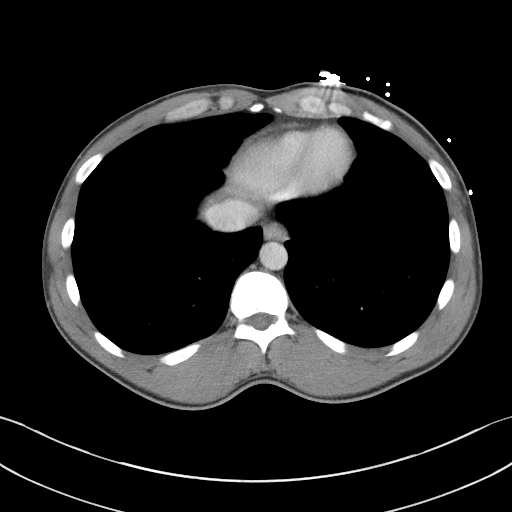

[Series 5: coronal st · coronal · 0.66mm/px · 3 of 101 slices shown]
[im 34/101  soft-tissue]
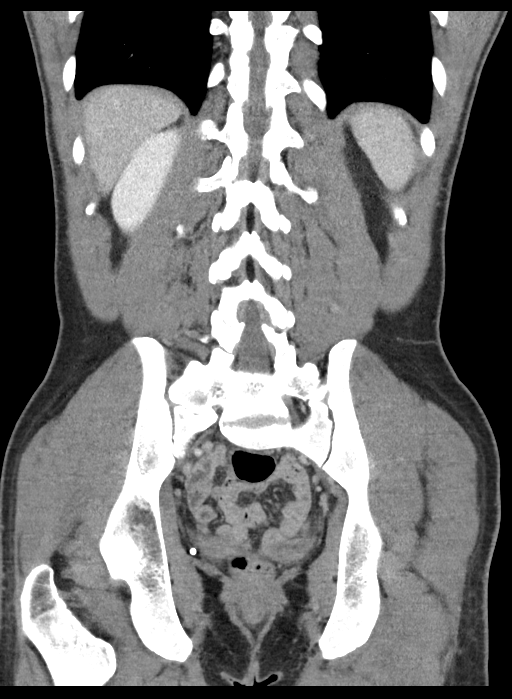
[im 45/101  soft-tissue]
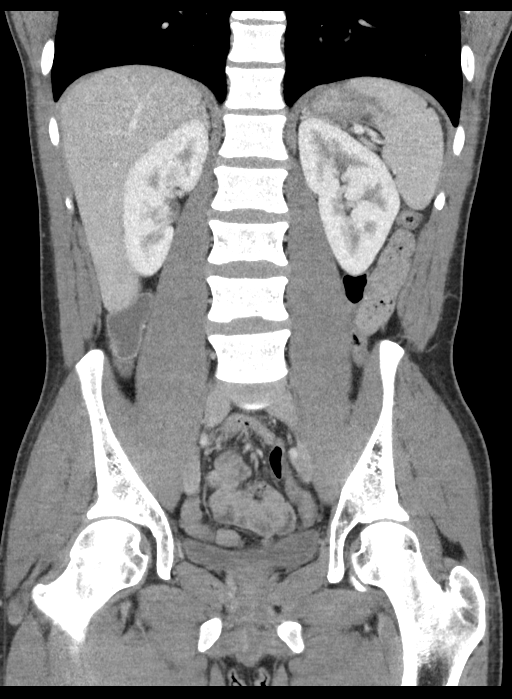
[im 56/101  soft-tissue]
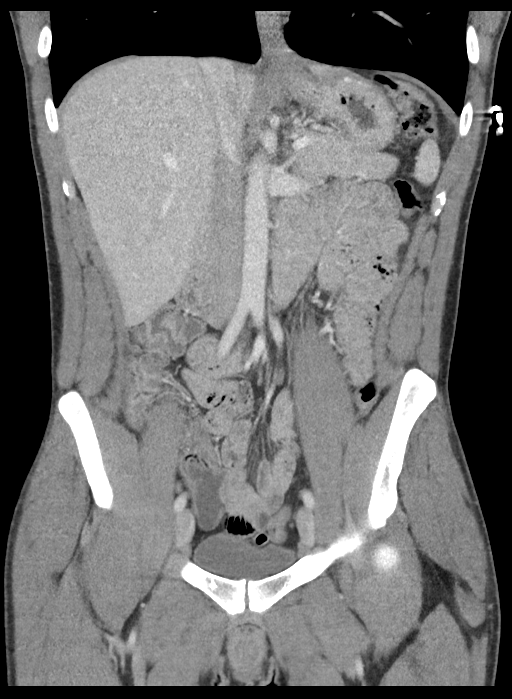

[16 of 46 positions shown; findings below may reference images not displayed]

FINDINGS: Lower chest: No acute abnormality.

Hepatobiliary: There is focal fat in the left hepatic lobe along the
falciform ligament with no significant focal abnormality in the
liver. No calcified gallstones, gallbladder wall thickening, or
biliary dilatation.

Pancreas: Unremarkable. No pancreatic ductal dilatation or
surrounding inflammatory changes.

Spleen: Normal in size and enhancement.

Adrenals/Urinary Tract: There is no adrenal mass. Bilateral renal
cortex enhances uniformly. No urinary stones or obstruction are
observed. Normal bladder thickness.

Stomach/Bowel: There is fold thickening in the stomach and in the
left abdominal small bowel without evidence of small-bowel
obstruction or inflammation. The appendix is prominent in caliber
but no more than previously, measuring 8.9 mm. Again the wall is
slightly enhancing but there are no inflammatory changes. There is
fluid in the colon but no wall thickening or inflammation.

Vascular/Lymphatic: No significant vascular findings are present. No
enlarged abdominal or pelvic lymph nodes.

Reproductive: Normal prostate.

Other: No abdominal wall hernia or abnormality. No abdominopelvic
ascites. There is no free air or abscess.

Musculoskeletal: No acute or significant osseous findings.
IMPRESSION: 1. Prominent enhancing appendix, but this was also seen previously
and is not significantly changed. Clinical correlation advised.
2. Gastroenteritis and fluid in the colon, but no bowel obstruction
or inflammatory change.
3. No evidence of urinary stones or obstruction.

## 2022-08-18 ENCOUNTER — Emergency Department (HOSPITAL_BASED_OUTPATIENT_CLINIC_OR_DEPARTMENT_OTHER)
Admission: EM | Admit: 2022-08-18 | Discharge: 2022-08-18 | Disposition: A | Discharge status: Home / Self Care | Payer: Medicaid Other | Attending: Emergency Medicine | Admitting: Emergency Medicine

## 2022-08-18 ENCOUNTER — Other Ambulatory Visit: Payer: Self-pay

## 2022-08-18 ENCOUNTER — Encounter (HOSPITAL_BASED_OUTPATIENT_CLINIC_OR_DEPARTMENT_OTHER): Payer: Self-pay | Admitting: Emergency Medicine

## 2022-08-18 DIAGNOSIS — F1290 Cannabis use, unspecified, uncomplicated: Secondary | ICD-10-CM | POA: Diagnosis not present

## 2022-08-18 DIAGNOSIS — Z79899 Other long term (current) drug therapy: Secondary | ICD-10-CM | POA: Diagnosis not present

## 2022-08-18 DIAGNOSIS — J45909 Unspecified asthma, uncomplicated: Secondary | ICD-10-CM | POA: Diagnosis not present

## 2022-08-18 DIAGNOSIS — R109 Unspecified abdominal pain: Secondary | ICD-10-CM | POA: Diagnosis present

## 2022-08-18 DIAGNOSIS — R112 Nausea with vomiting, unspecified: Secondary | ICD-10-CM | POA: Diagnosis not present

## 2022-08-18 DIAGNOSIS — R000 Tachycardia, unspecified: Secondary | ICD-10-CM | POA: Insufficient documentation

## 2022-08-18 DIAGNOSIS — F12188 Cannabis abuse with other cannabis-induced disorder: Secondary | ICD-10-CM | POA: Diagnosis not present

## 2022-08-18 LAB — CBC WITH DIFFERENTIAL/PLATELET
Abs Immature Granulocytes: 0.1 10*3/uL — ABNORMAL HIGH (ref 0.00–0.07)
Basophils Absolute: 0.1 10*3/uL (ref 0.0–0.1)
Basophils Relative: 1 %
Eosinophils Absolute: 0 10*3/uL (ref 0.0–0.5)
Eosinophils Relative: 0 %
HCT: 54.2 % — ABNORMAL HIGH (ref 39.0–52.0)
Hemoglobin: 18.9 g/dL — ABNORMAL HIGH (ref 13.0–17.0)
Immature Granulocytes: 1 %
Lymphocytes Relative: 18 %
Lymphs Abs: 3.4 10*3/uL (ref 0.7–4.0)
MCH: 30.1 pg (ref 26.0–34.0)
MCHC: 34.9 g/dL (ref 30.0–36.0)
MCV: 86.4 fL (ref 80.0–100.0)
Monocytes Absolute: 1.2 10*3/uL — ABNORMAL HIGH (ref 0.1–1.0)
Monocytes Relative: 6 %
Neutro Abs: 14.5 10*3/uL — ABNORMAL HIGH (ref 1.7–7.7)
Neutrophils Relative %: 74 %
Platelets: 411 10*3/uL — ABNORMAL HIGH (ref 150–400)
RBC: 6.27 MIL/uL — ABNORMAL HIGH (ref 4.22–5.81)
RDW: 12.9 % (ref 11.5–15.5)
WBC: 19.3 10*3/uL — ABNORMAL HIGH (ref 4.0–10.5)
nRBC: 0 % (ref 0.0–0.2)

## 2022-08-18 LAB — COMPREHENSIVE METABOLIC PANEL
ALT: 22 U/L (ref 0–44)
AST: 26 U/L (ref 15–41)
Albumin: 5.4 g/dL — ABNORMAL HIGH (ref 3.5–5.0)
Alkaline Phosphatase: 75 U/L (ref 38–126)
Anion gap: 17 — ABNORMAL HIGH (ref 5–15)
BUN: 40 mg/dL — ABNORMAL HIGH (ref 6–20)
CO2: 22 mmol/L (ref 22–32)
Calcium: 10.6 mg/dL — ABNORMAL HIGH (ref 8.9–10.3)
Chloride: 92 mmol/L — ABNORMAL LOW (ref 98–111)
Creatinine, Ser: 1.98 mg/dL — ABNORMAL HIGH (ref 0.61–1.24)
GFR, Estimated: 44 mL/min — ABNORMAL LOW (ref >60)
Glucose, Bld: 127 mg/dL — ABNORMAL HIGH (ref 70–99)
Potassium: 3.3 mmol/L — ABNORMAL LOW (ref 3.5–5.1)
Sodium: 131 mmol/L — ABNORMAL LOW (ref 135–145)
Total Bilirubin: 1.4 mg/dL — ABNORMAL HIGH (ref 0.3–1.2)
Total Protein: 10.8 g/dL — ABNORMAL HIGH (ref 6.5–8.1)

## 2022-08-18 LAB — URINALYSIS, MICROSCOPIC (REFLEX): WBC, UA: >50 WBC/hpf (ref 0–5)

## 2022-08-18 LAB — URINALYSIS, ROUTINE W REFLEX MICROSCOPIC
Glucose, UA: NEGATIVE mg/dL
Ketones, ur: 15 mg/dL — AB
Leukocytes,Ua: NEGATIVE
Nitrite: NEGATIVE
Protein, ur: >=300 mg/dL — AB
Specific Gravity, Urine: >=1.03 (ref 1.005–1.030)
pH: 5.5 (ref 5.0–8.0)

## 2022-08-18 LAB — RAPID URINE DRUG SCREEN, HOSP PERFORMED
Amphetamines: NOT DETECTED
Barbiturates: NOT DETECTED
Benzodiazepines: NOT DETECTED
Cocaine: NOT DETECTED
Opiates: NOT DETECTED
Tetrahydrocannabinol: POSITIVE — AB

## 2022-08-18 LAB — LIPASE, BLOOD: Lipase: 33 U/L (ref 11–51)

## 2022-08-18 LAB — ETHANOL: Alcohol, Ethyl (B): <10 mg/dL (ref <10)

## 2022-08-18 MED ORDER — SODIUM CHLORIDE 0.9 % IV BOLUS
1000.0000 mL | Freq: Once | INTRAVENOUS | Status: AC
  Administered 2022-08-18: 1000 mL via INTRAVENOUS

## 2022-08-18 MED ORDER — DIPHENHYDRAMINE HCL 50 MG/ML IJ SOLN
25.0000 mg | Freq: Once | INTRAMUSCULAR | Status: AC
  Administered 2022-08-18: 25 mg via INTRAVENOUS
  Filled 2022-08-18: qty 1

## 2022-08-18 MED ORDER — HYDROMORPHONE HCL 1 MG/ML IJ SOLN
0.5000 mg | Freq: Once | INTRAMUSCULAR | Status: AC
  Administered 2022-08-18: 0.5 mg via INTRAVENOUS
  Filled 2022-08-18: qty 1

## 2022-08-18 MED ORDER — PROMETHAZINE HCL 25 MG RE SUPP: 25.0000 mg | Freq: Four times a day (QID) | RECTAL | 0 refills | Status: DC | PRN

## 2022-08-18 MED ORDER — POTASSIUM CHLORIDE CRYS ER 20 MEQ PO TBCR
40.0000 meq | EXTENDED_RELEASE_TABLET | Freq: Once | ORAL | Status: AC
  Administered 2022-08-18: 40 meq via ORAL
  Filled 2022-08-18: qty 2

## 2022-08-18 MED ORDER — HALOPERIDOL LACTATE 5 MG/ML IJ SOLN
5.0000 mg | Freq: Once | INTRAMUSCULAR | Status: AC
  Administered 2022-08-18: 5 mg via INTRAVENOUS
  Filled 2022-08-18: qty 1

## 2022-08-18 NOTE — ED Notes (Signed)
Lab notified of urine specimen collection add on. Lab said they would be added to previously sent specimen.

## 2022-08-18 NOTE — ED Triage Notes (Signed)
Pt reports abd pain; sts he has been drinking d/t the passing of a family member; thinks he is dehydrated

## 2022-08-18 NOTE — ED Provider Notes (Signed)
[facility] EMERGENCY DEPARTMENT Provider Note   CSN: [id number] Arrival date & time: 08/18/22  1538     History  Chief Complaint  Patient presents with   Abdominal Pain    Carlos Guzman is a 36 y.o. male.  36 year old male with past medical history of asthma, HIV, pancreatitis, cannabinoid hyperemesis syndrome and substance abuse presents with complaint of uncontrollable vomiting and feeling dehydrated.  Patient states that due to above conditions, he is not supposed to drink alcohol however due to the death of a family member he was binge drinking on Friday (2 days ago) and now cannot stop vomiting.  States he does not feel like he is as sick today as he generally is when he presents to the emergency room and is trying to prevent getting that sick.  Denies constipation, diarrhea.  Denies significant abdominal pain.  No other complaints or concerns today.       Home Medications Prior to Admission medications   Medication Sig Start Date End Date Taking? Authorizing Provider  promethazine (PHENERGAN) 25 MG suppository Place 1 suppository (25 mg total) rectally every 6 (six) hours as needed for nausea or vomiting. 08/18/22  Yes Tacy Learn, PA-C  amLODipine (NORVASC) 5 MG tablet Take 1 tablet (5 mg total) by mouth daily. Patient not taking: Reported on 07/18/2022 05/08/22   Azucena Cecil, PA-C  DOVATO 50-300 MG tablet Take 1 tablet by mouth daily. 07/16/21   [provider]  metoCLOPramide (REGLAN) 10 MG tablet Take 1 tablet (10 mg total) by mouth every 6 (six) hours as needed for nausea or vomiting. Patient not taking: No sig reported 06/10/19 11/17/20  Molpus, Jenny Reichmann, MD  sucralfate (CARAFATE) 1 g tablet Take 1 tablet (1 g total) by mouth 4 (four) times daily -  with meals and at bedtime. 06/10/19 06/10/19  Molpus, John, MD      Allergies    Droperidol    Review of Systems   Review of Systems Negative except as per HPI Physical Exam Updated Vital  Signs BP (!) 154/102   Pulse 92   Temp 98.2 F (36.8 C) (Oral)   Resp 18   Ht _0  (1.854 m)   Wt 74.8 kg   SpO2 99%   BMI 21.77 kg/m  Physical Exam Vitals and nursing note reviewed.  Constitutional:      General: He is not in acute distress.    Appearance: He is well-developed. He is not diaphoretic.  HENT:     Head: Normocephalic and atraumatic.  Cardiovascular:     Rate and Rhythm: Regular rhythm. Tachycardia present.     Heart sounds: Normal heart sounds.  Pulmonary:     Effort: Pulmonary effort is normal.     Breath sounds: Normal breath sounds.  Abdominal:     General: Bowel sounds are normal.     Tenderness: There is no abdominal tenderness. There is no right CVA tenderness or left CVA tenderness.  Skin:    General: Skin is warm and dry.     Findings: No erythema or rash.  Neurological:     Mental Status: He is alert and oriented to person, place, and time.  Psychiatric:        Behavior: Behavior normal.     ED Results / Procedures / Treatments   Labs (all labs ordered are listed, but only abnormal results are displayed) Labs Reviewed  CBC WITH DIFFERENTIAL/PLATELET - Abnormal; Notable for the following components:  Result Value   WBC 19.3 (*)    RBC 6.27 (*)    Hemoglobin 18.9 (*)    HCT 54.2 (*)    Platelets 411 (*)    Neutro Abs 14.5 (*)    Monocytes Absolute 1.2 (*)    Abs Immature Granulocytes 0.10 (*)    All other components within normal limits  COMPREHENSIVE METABOLIC PANEL - Abnormal; Notable for the following components:   Sodium 131 (*)    Potassium 3.3 (*)    Chloride 92 (*)    Glucose, Bld 127 (*)    BUN 40 (*)    Creatinine, Ser 1.98 (*)    Calcium 10.6 (*)    Total Protein 10.8 (*)    Albumin 5.4 (*)    Total Bilirubin 1.4 (*)    GFR, Estimated 44 (*)    Anion gap 17 (*)    All other components within normal limits  URINALYSIS, ROUTINE W REFLEX MICROSCOPIC - Abnormal; Notable for the following components:   APPearance  CLOUDY (*)    Hgb urine dipstick MODERATE (*)    Bilirubin Urine MODERATE (*)    Ketones, ur 15 (*)    Protein, ur >=300 (*)    All other components within normal limits  RAPID URINE DRUG SCREEN, HOSP PERFORMED - Abnormal; Notable for the following components:   Tetrahydrocannabinol POSITIVE (*)    All other components within normal limits  URINALYSIS, MICROSCOPIC (REFLEX) - Abnormal; Notable for the following components:   Bacteria, UA MANY (*)    All other components within normal limits  URINE CULTURE  LIPASE, BLOOD  ETHANOL  GC/CHLAMYDIA PROBE AMP ([place]) NOT AT [facility]    EKG None  Radiology No results found.  Procedures Procedures    Medications Ordered in ED Medications  sodium chloride 0.9 % bolus 1,000 mL (0 mLs Intravenous Stopped 08/18/22 1715)  haloperidol lactate (HALDOL) injection 5 mg (5 mg Intravenous Given 08/18/22 1606)  diphenhydrAMINE (BENADRYL) injection 25 mg (25 mg Intravenous Given 08/18/22 1604)  sodium chloride 0.9 % bolus 1,000 mL (0 mLs Intravenous Stopped 08/18/22 1814)  potassium chloride SA (KLOR-CON M) CR tablet 40 mEq (40 mEq Oral Given 08/18/22 1715)  HYDROmorphone (DILAUDID) injection 0.5 mg (0.5 mg Intravenous Given 08/18/22 1734)    ED Course/ Medical Decision Making/ A&P                           Medical Decision Making Amount and/or Complexity of Data Reviewed Labs: ordered.  Risk Prescription drug management.   This patient presents to the ED for concern of nausea, vomiting, this involves an extensive number of treatment options, and is a complaint that carries with it a high risk of complications and morbidity.  The differential diagnosis includes but not limited to pancreatitis, gastritis, cannabinoid hyperemesis, electrolyte abnormality, dehydration, AKI   Co morbidities that complicate the patient evaluation  HIV, polysubstance abuse, AKI, cannabinoid hyperemesis syndrome, keratitis, alcohol abuse   Additional  history obtained:  External records from outside source obtained and reviewed including discharge summary dated 07/18/2022.  Patient was admitted with AKI, concern for SMA syndrome, managed in consultation with general surgery, able to tolerate p.o.'s and felt safe for discharge.   Lab Tests:  I Ordered, and personally interpreted labs.  The pertinent results include: CBC with leukocytosis of white count 19.3, similar to prior on file, suspected secondary to vomiting.  CMP with creatinine of 1.98, similar to prior, known  CKD 3.  Anion gap of 17 with normal bicarb, glucose 127.  Potassium mildly low at 3.3 with elevated bili at 1.4 with normal LFTs and alk phos.  UDS positive for marijuana.  Urinalysis positive for bilirubin and ketones, greater than 300 protein, leukocyte and nitrite negative.  Add on culture due to history of HIV with many bacteria in urine.  Also add on GC chlamydia.  Lipase normal, alcohol negative.   Consultations Obtained:  I requested consultation with the ER attending, Dr. Truett Mainland,  and discussed lab and imaging findings as well as pertinent plan - they recommend: Add urine culture   Problem List / ED Course / Critical interventions / Medication management  36 year old male with above history presents with nausea, nonbloody/nonbilious emesis onset 2 days ago after binge drinking.  Arrives in the ER, tachycardic, actively vomiting into the sink with elevated blood pressure.  His abdomen is soft and nontender.  Symptoms improved with IV fluids, Haldol with Benadryl and Dilaudid.  Patient is able to tolerate oral fluids.  Mild hypokalemia, replaced orally.  Patient is discharged with referral to local PCP for recheck with return to ER precautions. I ordered medication including IV fluids, k-dur, Dilaudid, Haldol, Benadryl for vomiting, hydration, hypokalemia Reevaluation of the patient after these medicines showed that the patient resolved I have reviewed the patients home  medicines and have made adjustments as needed   Social Determinants of Health:  No PCP   Test / Admission - Considered:  Consider CT abdomen pelvis however labs similar to 1 improved compared to prior on file.  CT from prior admission reviewed.         Final Clinical Impression(s) / ED Diagnoses Final diagnoses:  Nausea and vomiting in adult  Cannabinoid hyperemesis syndrome    Rx / DC Orders ED Discharge Orders          Ordered    promethazine (PHENERGAN) 25 MG suppository  Every 6 hours PRN        08/18/22 1806              Tacy Learn, PA-C 08/18/22 1900    Cristie Hem, MD 08/19/22 [phone]

## 2022-08-18 NOTE — Discharge Instructions (Addendum)
Recheck with primary care provider, resources provided for follow-up, please call to schedule this appointment. Return to ER for worsening or concerning symptoms. Abstain from alcohol and marijuana use. Phenergan suppositories as needed for vomiting.

## 2022-08-19 LAB — URINE CULTURE: Culture: NO GROWTH

## 2022-08-20 LAB — GC/CHLAMYDIA PROBE AMP (~~LOC~~) NOT AT ARMC
Chlamydia: NEGATIVE
Comment: NEGATIVE
Comment: NORMAL
Neisseria Gonorrhea: NEGATIVE

## 2022-08-22 ENCOUNTER — Encounter (HOSPITAL_BASED_OUTPATIENT_CLINIC_OR_DEPARTMENT_OTHER): Payer: Self-pay | Admitting: Internal Medicine

## 2022-08-22 ENCOUNTER — Other Ambulatory Visit: Payer: Self-pay

## 2022-08-22 ENCOUNTER — Encounter (HOSPITAL_COMMUNITY): Payer: Self-pay

## 2022-08-22 ENCOUNTER — Observation Stay (HOSPITAL_BASED_OUTPATIENT_CLINIC_OR_DEPARTMENT_OTHER)
Admission: EM | Admit: 2022-08-22 | Discharge: 2022-08-24 | Disposition: A | Discharge status: Home / Self Care | Payer: Medicaid Other | Attending: Internal Medicine | Admitting: Internal Medicine

## 2022-08-22 DIAGNOSIS — J45909 Unspecified asthma, uncomplicated: Secondary | ICD-10-CM | POA: Insufficient documentation

## 2022-08-22 DIAGNOSIS — F1290 Cannabis use, unspecified, uncomplicated: Secondary | ICD-10-CM | POA: Diagnosis present

## 2022-08-22 DIAGNOSIS — E871 Hypo-osmolality and hyponatremia: Secondary | ICD-10-CM | POA: Diagnosis not present

## 2022-08-22 DIAGNOSIS — Z79899 Other long term (current) drug therapy: Secondary | ICD-10-CM | POA: Diagnosis not present

## 2022-08-22 DIAGNOSIS — B20 Human immunodeficiency virus [HIV] disease: Secondary | ICD-10-CM | POA: Insufficient documentation

## 2022-08-22 DIAGNOSIS — N182 Chronic kidney disease, stage 2 (mild): Secondary | ICD-10-CM | POA: Diagnosis not present

## 2022-08-22 DIAGNOSIS — F17210 Nicotine dependence, cigarettes, uncomplicated: Secondary | ICD-10-CM | POA: Diagnosis not present

## 2022-08-22 DIAGNOSIS — R112 Nausea with vomiting, unspecified: Secondary | ICD-10-CM | POA: Insufficient documentation

## 2022-08-22 DIAGNOSIS — E876 Hypokalemia: Principal | ICD-10-CM | POA: Diagnosis present

## 2022-08-22 DIAGNOSIS — R9431 Abnormal electrocardiogram [ECG] [EKG]: Secondary | ICD-10-CM | POA: Diagnosis not present

## 2022-08-22 DIAGNOSIS — R109 Unspecified abdominal pain: Secondary | ICD-10-CM | POA: Diagnosis present

## 2022-08-22 DIAGNOSIS — N183 Chronic kidney disease, stage 3 unspecified: Secondary | ICD-10-CM | POA: Diagnosis present

## 2022-08-22 DIAGNOSIS — N179 Acute kidney failure, unspecified: Secondary | ICD-10-CM | POA: Diagnosis present

## 2022-08-22 DIAGNOSIS — F1011 Alcohol abuse, in remission: Secondary | ICD-10-CM | POA: Diagnosis present

## 2022-08-22 LAB — COMPREHENSIVE METABOLIC PANEL
ALT: 22 U/L (ref 0–44)
AST: 26 U/L (ref 15–41)
Albumin: 5 g/dL (ref 3.5–5.0)
Alkaline Phosphatase: 62 U/L (ref 38–126)
Anion gap: 16 — ABNORMAL HIGH (ref 5–15)
BUN: 32 mg/dL — ABNORMAL HIGH (ref 6–20)
CO2: 23 mmol/L (ref 22–32)
Calcium: 9.8 mg/dL (ref 8.9–10.3)
Chloride: 87 mmol/L — ABNORMAL LOW (ref 98–111)
Creatinine, Ser: 2.23 mg/dL — ABNORMAL HIGH (ref 0.61–1.24)
GFR, Estimated: 38 mL/min — ABNORMAL LOW (ref >60)
Glucose, Bld: 142 mg/dL — ABNORMAL HIGH (ref 70–99)
Potassium: 2.5 mmol/L — CL (ref 3.5–5.1)
Sodium: 126 mmol/L — ABNORMAL LOW (ref 135–145)
Total Bilirubin: 1.1 mg/dL (ref 0.3–1.2)
Total Protein: 9.6 g/dL — ABNORMAL HIGH (ref 6.5–8.1)

## 2022-08-22 LAB — CBC WITH DIFFERENTIAL/PLATELET
Abs Immature Granulocytes: 0.04 10*3/uL (ref 0.00–0.07)
Basophils Absolute: 0.1 10*3/uL (ref 0.0–0.1)
Basophils Relative: 0 %
Eosinophils Absolute: 0 10*3/uL (ref 0.0–0.5)
Eosinophils Relative: 0 %
HCT: 48.6 % (ref 39.0–52.0)
Hemoglobin: 17.5 g/dL — ABNORMAL HIGH (ref 13.0–17.0)
Immature Granulocytes: 0 %
Lymphocytes Relative: 13 %
Lymphs Abs: 2.1 10*3/uL (ref 0.7–4.0)
MCH: 30.6 pg (ref 26.0–34.0)
MCHC: 36 g/dL (ref 30.0–36.0)
MCV: 85 fL (ref 80.0–100.0)
Monocytes Absolute: 1 10*3/uL (ref 0.1–1.0)
Monocytes Relative: 6 %
Neutro Abs: 12.6 10*3/uL — ABNORMAL HIGH (ref 1.7–7.7)
Neutrophils Relative %: 81 %
Platelets: 381 10*3/uL (ref 150–400)
RBC: 5.72 MIL/uL (ref 4.22–5.81)
RDW: 12.3 % (ref 11.5–15.5)
WBC: 15.8 10*3/uL — ABNORMAL HIGH (ref 4.0–10.5)
nRBC: 0 % (ref 0.0–0.2)

## 2022-08-22 LAB — LIPASE, BLOOD: Lipase: 35 U/L (ref 11–51)

## 2022-08-22 MED ORDER — FAMOTIDINE IN NACL 20-0.9 MG/50ML-% IV SOLN
20.0000 mg | Freq: Once | INTRAVENOUS | Status: AC
  Administered 2022-08-22: 20 mg via INTRAVENOUS
  Filled 2022-08-22: qty 50

## 2022-08-22 MED ORDER — PROMETHAZINE HCL 25 MG PO TABS
12.5000 mg | ORAL_TABLET | Freq: Four times a day (QID) | ORAL | Status: DC | PRN
  Administered 2022-08-23 – 2022-08-24 (×4): 12.5 mg via ORAL
  Filled 2022-08-22 (×4): qty 1

## 2022-08-22 MED ORDER — MORPHINE SULFATE (PF) 4 MG/ML IV SOLN
4.0000 mg | Freq: Once | INTRAVENOUS | Status: AC
  Administered 2022-08-22: 4 mg via INTRAVENOUS
  Filled 2022-08-22: qty 1

## 2022-08-22 MED ORDER — LACTATED RINGERS IV BOLUS
1000.0000 mL | Freq: Once | INTRAVENOUS | Status: AC
  Administered 2022-08-22: 1000 mL via INTRAVENOUS

## 2022-08-22 MED ORDER — MORPHINE SULFATE (PF) 2 MG/ML IV SOLN
2.0000 mg | INTRAVENOUS | Status: DC | PRN
  Administered 2022-08-22 – 2022-08-24 (×10): 2 mg via INTRAVENOUS
  Filled 2022-08-22 (×11): qty 1

## 2022-08-22 MED ORDER — POTASSIUM CHLORIDE CRYS ER 20 MEQ PO TBCR
40.0000 meq | EXTENDED_RELEASE_TABLET | Freq: Once | ORAL | Status: AC
  Administered 2022-08-22: 40 meq via ORAL
  Filled 2022-08-22: qty 2

## 2022-08-22 MED ORDER — POTASSIUM CHLORIDE IN NACL 20-0.9 MEQ/L-% IV SOLN
INTRAVENOUS | Status: AC
  Filled 2022-08-22: qty 1000

## 2022-08-22 MED ORDER — SODIUM CHLORIDE 0.9 % IV SOLN
INTRAVENOUS | Status: DC | PRN
  Administered 2022-08-22: 10 mL/h via INTRAVENOUS

## 2022-08-22 MED ORDER — ENOXAPARIN SODIUM 40 MG/0.4ML IJ SOSY
40.0000 mg | PREFILLED_SYRINGE | INTRAMUSCULAR | Status: DC
  Administered 2022-08-22 – 2022-08-23 (×2): 40 mg via SUBCUTANEOUS
  Filled 2022-08-22 (×2): qty 0.4

## 2022-08-22 MED ORDER — POTASSIUM CHLORIDE 10 MEQ/100ML IV SOLN
10.0000 meq | INTRAVENOUS | Status: AC
  Administered 2022-08-22 (×2): 10 meq via INTRAVENOUS
  Filled 2022-08-22 (×2): qty 100

## 2022-08-22 MED ORDER — POTASSIUM CHLORIDE 10 MEQ/100ML IV SOLN
10.0000 meq | Freq: Once | INTRAVENOUS | Status: AC
  Administered 2022-08-22: 10 meq via INTRAVENOUS
  Filled 2022-08-22: qty 100

## 2022-08-22 MED ORDER — POTASSIUM CHLORIDE IN NACL 40-0.9 MEQ/L-% IV SOLN
INTRAVENOUS | Status: DC
  Filled 2022-08-22 (×4): qty 1000

## 2022-08-22 MED ORDER — MORPHINE SULFATE (PF) 2 MG/ML IV SOLN
2.0000 mg | Freq: Once | INTRAVENOUS | Status: AC
  Administered 2022-08-22: 2 mg via INTRAVENOUS
  Filled 2022-08-22: qty 1

## 2022-08-22 MED ORDER — SODIUM CHLORIDE 0.9 % IV SOLN: Freq: Once | INTRAVENOUS | Status: DC

## 2022-08-22 MED ORDER — METOCLOPRAMIDE HCL 5 MG/ML IJ SOLN
10.0000 mg | Freq: Once | INTRAMUSCULAR | Status: AC
  Administered 2022-08-22: 10 mg via INTRAVENOUS
  Filled 2022-08-22: qty 2

## 2022-08-22 NOTE — H&P (Signed)
History and Physical    Patient: Carlos Guzman TXM:[id number] DOB: 20-Nov-1985 DOA: 08/22/2022 DOS: the patient was seen and examined on 08/22/2022 PCP: Patient, No Pcp Per  Patient coming from: Home  Chief Complaint:  Chief Complaint  Patient presents with   Abdominal Pain   HPI: [name] is a 36 y.o. male with medical history significant of HIV disease on HAART, asthma, cannabinoid hyperemesis syndrome, polysubstance abuse, history of pancreatitis who presents to the ER with abdominal pain nausea or vomiting.  Patient was unable to keep anything down apparently earlier.  Patient still drinks alcohol but last drink was about a week ago.  No evidence of withdrawals at this point.  His previous symptoms were related to alcoholism however.  Was seen at the ER.  Evaluated fully.  Patient mainly complained of the pain.  No diarrhea or constipation.  Has not had any oral intake for almost a week so look dehydrated.  He is being admitted for correction of his potassium which was 2.5 as well as low sodium.  Review of Systems: As mentioned in the history of present illness. All other systems reviewed and are negative. Past Medical History:  Diagnosis Date   Asthma    Cannabinoid hyperemesis syndrome    HIV (human immunodeficiency virus infection) (HCC)    Pancreatitis    Substance abuse (HCC)    Past Surgical History:  Procedure Laterality Date   NO PAST SURGERIES     Social History:  reports that he has been smoking cigarettes. He has been smoking an average of 1 pack per day. He has never used smokeless tobacco. He reports current alcohol use. He reports current drug use. Drugs: Marijuana and Cocaine.  Allergies  Allergen Reactions   Droperidol Other (See Comments)    Extrapyramidal reaction - medicate with benadryl first (Droperidol is a medication that prevents nausea and vomiting caused by surgical procedures. The brand name of this medication is Inapsine.)    Family History   Problem Relation Age of Onset   CAD Neg Hx    Diabetes Neg Hx     Prior to Admission medications   Medication Sig Start Date End Date Taking? Authorizing Provider  amLODipine (NORVASC) 5 MG tablet Take 1 tablet (5 mg total) by mouth daily. Patient not taking: Reported on 07/18/2022 05/08/22   Al Decant, PA-C  DOVATO 50-300 MG tablet Take 1 tablet by mouth daily. 07/16/21   [provider]  promethazine (PHENERGAN) 25 MG suppository Place 1 suppository (25 mg total) rectally every 6 (six) hours as needed for nausea or vomiting. 08/18/22   Jeannie Fend, PA-C  metoCLOPramide (REGLAN) 10 MG tablet Take 1 tablet (10 mg total) by mouth every 6 (six) hours as needed for nausea or vomiting. Patient not taking: No sig reported 06/10/19 11/17/20  Molpus, Jonny Ruiz, MD  sucralfate (CARAFATE) 1 g tablet Take 1 tablet (1 g total) by mouth 4 (four) times daily -  with meals and at bedtime. 06/10/19 06/10/19  Molpus, John, MD    Physical Exam: Vitals:   08/22/22 1751 08/22/22 1901 08/22/22 2022 08/22/22 2252  BP: (!) 154/88 (!) 187/98 (!) 183/96 (!) 184/101  Pulse: 84 80 83 90  Resp: 18  18 18   Temp: 97.9 F (36.6 C)  98.8 F (37.1 C) 98.3 F (36.8 C)  TempSrc: Oral  Oral Oral  SpO2: 100%  100% 100%   Constitutional: Acutely ill looking NAD, calm, comfortable Eyes: PERRL, lids and conjunctivae normal ENMT: Mucous  membranes are dry. Posterior pharynx clear of any exudate or lesions.Normal dentition.  Neck: normal, supple, no masses, no thyromegaly Respiratory: clear to auscultation bilaterally, no wheezing, no crackles. Normal respiratory effort. No accessory muscle use.  Cardiovascular: Regular rate and rhythm, no murmurs / rubs / gallops. No extremity edema. 2+ pedal pulses. No carotid bruits.  Abdomen: no tenderness, no masses palpated. No hepatosplenomegaly. Bowel sounds positive.  Musculoskeletal: Good range of motion, no joint swelling or tenderness, Skin: no rashes, lesions,  ulcers. No induration Neurologic: CN 2-12 grossly intact. Sensation intact, DTR normal. Strength 5/5 in all 4.  Psychiatric: Normal judgment and insight. Alert and oriented x 3.  Anxious mood  Data Reviewed:  Blood pressure 141/101, pulse 118, respiratory of 18, white count 15.8, hemoglobin 17.5, sodium 126, chloride 87, creatinine 2.23 and BUN 32 calcium 9.8.  Glucose 142.  Assessment and Plan:   #1 severe hypokalemia: Patient admitted for management of his hypokalemia.  Aggressive replacement of potassium.  Controlling losses.  Suspected to be due to GI losses.  Check magnesium and replace if low.  #2 acute on chronic kidney disease stage II: Aggressively hydrate and monitor renal function.  #3 alcohol abuse in remission: No evidence of withdrawal.  #4 polysubstance abuse: Counseling provided  #5 cannabinoid hyperemesis syndrome: Most likely part of the source of infection.     Advance Care Planning:   Code Status: Full Code   Consults: None  Family Communication: No family at bedside  Severity of Illness: The appropriate patient status for this patient is INPATIENT. Inpatient status is judged to be reasonable and necessary in order to provide the required intensity of service to ensure the patient's safety. The patient's presenting symptoms, physical exam findings, and initial radiographic and laboratory data in the context of their chronic comorbidities is felt to place them at high risk for further clinical deterioration. Furthermore, it is not anticipated that the patient will be medically stable for discharge from the hospital within 2 midnights of admission.   * I certify that at the point of admission it is my clinical judgment that the patient will require inpatient hospital care spanning beyond 2 midnights from the point of admission due to high intensity of service, high risk for further deterioration and high frequency of surveillance required.*  AuthorLonia Blood,  MD 08/22/2022 11:04 PM  For on call review [email or web address].

## 2022-08-22 NOTE — TOC Initial Note (Signed)
Transition of Care [facility]) - Initial/Assessment Note    Patient Details  Name: Carlos Guzman MRN: [id number] Date of Birth: [birth year]  Transition of Care [facility]) CM/SW Contact:    Beckie Busing, RN Phone Number:[phone]  08/22/2022, 7:51 PM  Clinical Narrative:                  Transition of Care [facility]) Screening Note   Patient Details  Name: Carlos Guzman Date of Birth: May 18, 1986   Transition of Care [facility]) CM/SW Contact:    Beckie Busing, RN Phone Number: 08/22/2022, 7:52 PM    Transition of Care Department [facility]) has reviewed patient and no TOC needs have been identified at this time. We will continue to monitor patient advancement through interdisciplinary progression rounds. If new patient transition needs arise, please place a TOC consult.          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   Acute kidney failure (HCC) 10/24/2021   Intractable nausea and vomiting 09/06/2021   CKD (chronic kidney disease), stage III (HCC) 09/06/2021   Prolonged QT interval 09/06/2021   Cocaine abuse (HCC) 09/06/2021   ARF (acute renal failure) (HCC) 07/29/2019   Hyperkalemia 07/29/2019   Left upper quadrant abdominal pain 07/29/2019   Metabolic acidosis, increased anion gap 07/29/2019   Hyperbilirubinemia 07/29/2019   Hyponatremia 07/29/2019   Rhabdomyolysis 07/24/2019    AKI (acute kidney injury) (HCC) 07/23/2019   HIV (human immunodeficiency virus infection) (HCC) 07/23/2019   Cannabinoid hyperemesis syndrome 07/23/2019   Hypokalemia 07/23/2019   Leukocytosis 07/23/2019   Tobacco abuse 07/23/2019   Alcohol abuse, in remission 07/23/2019   PCP:  Patient, No Pcp Per Pharmacy:   [id number] - [place], [place] - [address] [place] [address] Phone: [phone] Fax: [phone]  [id number] - [place], [place] - [address] [place] [address] Phone: [phone] Fax: [phone]     Social Determinants of Health (SDOH) Interventions    Readmission Risk Interventions     No data to display

## 2022-08-22 NOTE — Progress Notes (Signed)
Plan of Care Note for accepted transfer   Patient: Carlos Guzman MRN: [id number]   DOA: 08/22/2022  Facility requesting transfer: [facility].  Requesting Provider: Elayne Snare, DO. Reason for transfer: Hypokalemia and AKI. Facility course:  Per EDP: " Chief Complaint  Patient presents with   Abdominal Pain    [name] is a 36 y.o. male.   Patient is a 36 year old male with a past medical history of HIV on Edwyna Shell, prior pancreatitis and alcoholic gastritis presenting to the emergency department with abdominal pain.  He states that he was seen here on Sunday with his abdominal pain and that the pain and vomiting has been persistent since then.  He denies any fevers or chills, diarrhea or constipation, dysuria or hematuria.  He states he feels dehydrated.  He states that the pain feels similar to when he had pancreatitis and alcoholic gastritis in the past.  He states that he has not drink anything since Friday and denies any history of alcohol withdrawal.  He states that he does smoke marijuana daily."  Lab work:  Comprehensive metabolic panel [[id number]] (Abnormal)   Collected: 08/22/22 1118   Updated: 08/22/22 1223   Specimen Type: Blood    Sodium 126 Low  mmol/L   Potassium 2.5 Low Panic  mmol/L   Chloride 87 Low  mmol/L   CO2 23 mmol/L   Glucose, Bld 142 High  mg/dL   BUN 32 High  mg/dL   Creatinine, Ser 6.30 High  mg/dL   Calcium 9.8 mg/dL   Total Protein 9.6 High  g/dL   Albumin 5.0 g/dL   AST 26 U/L   ALT 22 U/L   Alkaline Phosphatase 62 U/L   Total Bilirubin 1.1 mg/dL   GFR, Estimated 38 Low  mL/min   Anion gap 16 High   Lipase, blood [[id number]]   Collected: 08/22/22 1118   Updated: 08/22/22 1223   Specimen Type: Blood    Lipase 35 U/L  CBC with Differential [[id number]] (Abnormal)   Collected: 08/22/22 1118   Updated: 08/22/22 1129   Specimen Type: Blood    WBC 15.8 High  K/uL   RBC 5.72 MIL/uL   Hemoglobin 17.5 High  g/dL   HCT 42.7 %    MCV 06.2 fL   MCH 30.6 pg   MCHC 36.0 g/dL   RDW 37.6 %   Platelets 381 K/uL   nRBC 0.0 %   Neutrophils Relative % 81 %   Neutro Abs 12.6 High  K/uL   Lymphocytes Relative 13 %   Lymphs Abs 2.1 K/uL   Monocytes Relative 6 %   Monocytes Absolute 1.0 K/uL   Eosinophils Relative 0 %   Eosinophils Absolute 0.0 K/uL   Basophils Relative 0 %   Basophils Absolute 0.1 K/uL   Immature Granulocytes 0 %   Abs Immature Granulocytes 0.04 K/uL   Plan of care: The patient is accepted for admission to Telemetry unit, at [facility]..   Author: Bobette Mo, MD 08/22/2022  Check [email or web address] for on-call coverage.  Nursing staff, Please call [facility] Admits & Consults System-Wide number on Amion as soon as patient's arrival, so appropriate admitting provider can evaluate the pt.

## 2022-08-22 NOTE — ED Provider Notes (Signed)
[facility] EMERGENCY DEPARTMENT Provider Note   CSN: [id number] Arrival date & time: 08/22/22  0920     History  Chief Complaint  Patient presents with  . Abdominal Pain    Carlos Guzman is a 36 y.o. male.  Patient is a 36 year old male with a past medical history of HIV on Elnoria Howard, prior pancreatitis and alcoholic gastritis presenting to the emergency department with abdominal pain.  He states that he was seen here on Sunday with his abdominal pain and that the pain and vomiting has been persistent since then.  He denies any fevers or chills, diarrhea or constipation, dysuria or hematuria.  He states he feels dehydrated.  He states that the pain feels similar to when he had pancreatitis and alcoholic gastritis in the past.  He states that he has not drink anything since Friday and denies any history of alcohol withdrawal.  He states that he does smoke marijuana daily.  The history is provided by the patient.  Abdominal Pain      Home Medications Prior to Admission medications   Medication Sig Start Date End Date Taking? Authorizing Provider  amLODipine (NORVASC) 5 MG tablet Take 1 tablet (5 mg total) by mouth daily. Patient not taking: Reported on 07/18/2022 05/08/22   Azucena Cecil, PA-C  DOVATO 50-300 MG tablet Take 1 tablet by mouth daily. 07/16/21   [provider]  promethazine (PHENERGAN) 25 MG suppository Place 1 suppository (25 mg total) rectally every 6 (six) hours as needed for nausea or vomiting. 08/18/22   Tacy Learn, PA-C  metoCLOPramide (REGLAN) 10 MG tablet Take 1 tablet (10 mg total) by mouth every 6 (six) hours as needed for nausea or vomiting. Patient not taking: No sig reported 06/10/19 11/17/20  Molpus, Jenny Reichmann, MD  sucralfate (CARAFATE) 1 g tablet Take 1 tablet (1 g total) by mouth 4 (four) times daily -  with meals and at bedtime. 06/10/19 06/10/19  Molpus, John, MD      Allergies    Droperidol    Review of Systems   Review of  Systems  Gastrointestinal:  Positive for abdominal pain.    Physical Exam Updated Vital Signs BP (!) 152/99   Pulse (!) 101   Temp 97.7 F (36.5 C) (Oral)   Resp 16   SpO2 100%  Physical Exam Vitals and nursing note reviewed.  Constitutional:      General: He is not in acute distress.    Appearance: He is well-developed.  HENT:     Head: Normocephalic and atraumatic.     Mouth/Throat:     Pharynx: Oropharynx is clear.     Comments: Mildly dry mucous membranes Eyes:     Extraocular Movements: Extraocular movements intact.  Cardiovascular:     Rate and Rhythm: Regular rhythm. Tachycardia present.     Heart sounds: Normal heart sounds.  Pulmonary:     Effort: Pulmonary effort is normal.     Breath sounds: Normal breath sounds.  Abdominal:     General: Abdomen is flat.     Palpations: Abdomen is soft.     Tenderness: There is abdominal tenderness (Minimal) in the epigastric area. There is no guarding or rebound.  Skin:    General: Skin is warm and dry.  Neurological:     General: No focal deficit present.     Mental Status: He is alert and oriented to person, place, and time.  Psychiatric:        Mood and Affect: Mood  normal.        Behavior: Behavior normal.     ED Results / Procedures / Treatments   Labs (all labs ordered are listed, but only abnormal results are displayed) Labs Reviewed  COMPREHENSIVE METABOLIC PANEL - Abnormal; Notable for the following components:      Result Value   Sodium 126 (*)    Potassium 2.5 (*)    Chloride 87 (*)    Glucose, Bld 142 (*)    BUN 32 (*)    Creatinine, Ser 2.23 (*)    Total Protein 9.6 (*)    GFR, Estimated 38 (*)    Anion gap 16 (*)    All other components within normal limits  CBC WITH DIFFERENTIAL/PLATELET - Abnormal; Notable for the following components:   WBC 15.8 (*)    Hemoglobin 17.5 (*)    Neutro Abs 12.6 (*)    All other components within normal limits  LIPASE, BLOOD  MAGNESIUM     EKG None  Radiology No results found.  Procedures Procedures    Medications Ordered in ED Medications  potassium chloride 10 mEq in 100 mL IVPB (10 mEq Intravenous New Bag/Given 08/22/22 1226)  0.9 %  sodium chloride infusion (10 mL/hr Intravenous New Bag/Given 08/22/22 1222)  potassium chloride 10 mEq in 100 mL IVPB (has no administration in time range)  0.9 %  sodium chloride infusion (has no administration in time range)  lactated ringers bolus 1,000 mL (0 mLs Intravenous Stopped 08/22/22 1220)  famotidine (PEPCID) IVPB 20 mg premix (0 mg Intravenous Stopped 08/22/22 1156)  metoCLOPramide (REGLAN) injection 10 mg (10 mg Intravenous Given 08/22/22 1118)  potassium chloride SA (KLOR-CON M) CR tablet 40 mEq (40 mEq Oral Given 08/22/22 1213)  morphine (PF) 4 MG/ML injection 4 mg (4 mg Intravenous Given 08/22/22 1210)    ED Course/ Medical Decision Making/ A&P Clinical Course as of 08/22/22 1347  Thu Aug 22, 2022  1236 His workup shows severe hypokalemia with hyponatremia and AKI and elevated anion gap.  Magnesium level and EKG will be performed.  Will start electrolyte repletion and will require admission for further repletion and symptomatic control. [VK]  1346 Patient accepted by Dr. Robb Matar hospitalist [VK]    Clinical Course User Index [VK] Rexford Maus, DO                           Medical Decision Making This patient presents to the ED with chief complaint(s) of abdominal pain, N/V with pertinent past medical history of HIV, alcohol gastritis, pancreatitis which further complicates the presenting complaint. The complaint involves an extensive differential diagnosis and also carries with it a high risk of complications and morbidity.    The differential diagnosis includes pancreatitis, hepatitis, cholelithiasis, cholecystitis, gastritis, GERD, cyclical vomiting, dehydration, electrolyte abnormality, viral syndrome  Additional history obtained: Additional history  obtained from N/A Records reviewed recent ED records  ED Course and Reassessment: On patient's arrival to the emergency department she was tachycardic and mildly dehydrated appearing though overall comfortable on exam.  He will be given IV fluids and nausea control.  Labs will be performed to evaluate for cause of his symptoms and he will be closely reassessed.  Independent labs interpretation:  The following labs were independently interpreted: severe hypokalemia, AKI, hyponatremia  Independent visualization of imaging: N/A  Consultation: - Consulted or discussed management/test interpretation w/ external professional: hospitalist  Consideration for admission or further workup: Patient requires admission for  further management of his dehydration and electrolyte derangements Social Determinants of health: N/A    Amount and/or Complexity of Data Reviewed Labs: ordered.  Risk Prescription drug management. Decision regarding hospitalization.          Final Clinical Impression(s) / ED Diagnoses Final diagnoses:  Hypokalemia  AKI (acute kidney injury) ([place])  Hyponatremia  Nausea and vomiting, unspecified vomiting type    Rx / DC Orders ED Discharge Orders     None         Kemper Durie, DO 08/22/22 1255

## 2022-08-22 NOTE — ED Triage Notes (Signed)
Patient presents to ED via POV from home. Here with left sided abdominal pain, nausea and vomiting since Saturday. HIV positive.

## 2022-08-23 DIAGNOSIS — E876 Hypokalemia: Secondary | ICD-10-CM | POA: Diagnosis not present

## 2022-08-23 LAB — COMPREHENSIVE METABOLIC PANEL
ALT: 19 U/L (ref 0–44)
AST: 18 U/L (ref 15–41)
Albumin: 4.1 g/dL (ref 3.5–5.0)
Alkaline Phosphatase: 47 U/L (ref 38–126)
Anion gap: 8 (ref 5–15)
BUN: 25 mg/dL — ABNORMAL HIGH (ref 6–20)
CO2: 27 mmol/L (ref 22–32)
Calcium: 9.1 mg/dL (ref 8.9–10.3)
Chloride: 98 mmol/L (ref 98–111)
Creatinine, Ser: 1.66 mg/dL — ABNORMAL HIGH (ref 0.61–1.24)
GFR, Estimated: 54 mL/min — ABNORMAL LOW (ref >60)
Glucose, Bld: 122 mg/dL — ABNORMAL HIGH (ref 70–99)
Potassium: 3.3 mmol/L — ABNORMAL LOW (ref 3.5–5.1)
Sodium: 133 mmol/L — ABNORMAL LOW (ref 135–145)
Total Bilirubin: 1.1 mg/dL (ref 0.3–1.2)
Total Protein: 7.6 g/dL (ref 6.5–8.1)

## 2022-08-23 LAB — BASIC METABOLIC PANEL
Anion gap: 5 (ref 5–15)
BUN: 20 mg/dL (ref 6–20)
CO2: 27 mmol/L (ref 22–32)
Calcium: 8.8 mg/dL — ABNORMAL LOW (ref 8.9–10.3)
Chloride: 100 mmol/L (ref 98–111)
Creatinine, Ser: 1.39 mg/dL — ABNORMAL HIGH (ref 0.61–1.24)
GFR, Estimated: >60 mL/min (ref >60)
Glucose, Bld: 134 mg/dL — ABNORMAL HIGH (ref 70–99)
Potassium: 3.9 mmol/L (ref 3.5–5.1)
Sodium: 132 mmol/L — ABNORMAL LOW (ref 135–145)

## 2022-08-23 LAB — CBC
HCT: 44.2 % (ref 39.0–52.0)
Hemoglobin: 14.8 g/dL (ref 13.0–17.0)
MCH: 30.5 pg (ref 26.0–34.0)
MCHC: 33.5 g/dL (ref 30.0–36.0)
MCV: 91.1 fL (ref 80.0–100.0)
Platelets: 309 10*3/uL (ref 150–400)
RBC: 4.85 MIL/uL (ref 4.22–5.81)
RDW: 12.8 % (ref 11.5–15.5)
WBC: 10.2 10*3/uL (ref 4.0–10.5)
nRBC: 0 % (ref 0.0–0.2)

## 2022-08-23 MED ORDER — POTASSIUM CHLORIDE CRYS ER 20 MEQ PO TBCR
40.0000 meq | EXTENDED_RELEASE_TABLET | Freq: Once | ORAL | Status: AC
  Administered 2022-08-23: 40 meq via ORAL
  Filled 2022-08-23: qty 2

## 2022-08-23 NOTE — Progress Notes (Signed)
PROGRESS NOTE Carlos Guzman  DPO:[id number] DOB: [birth year] DOA: 08/22/2022 PCP: Patient, No Pcp Per   Brief Narrative/Hospital Course: 36 y.o.m w/  male with history of HIV on H ART, previous pancreatitis and alcoholic gastritis, asthma, cannabinoid hyperemesis syndrome, polysubstance abuse, recently seen in the ED on 12/10 with similar complaint presented with nausea vomiting abdominal pain.  Previous visit was on 11/9 for similar complaint-however CT abdomen pelvis showed distended stomach with ingested material nonspecific finding possibly physiologic/SMA syndrome, then had upper GI with contrast no evidence of SMA syndrome. In the ED hemodynamically stable labs with hyponatremia 126 hypokalemia 2.5, AKI creatinine 2.2, normal lipase and liver function, leukocytosis 15.8 but downtrending from 19.3 from 12/11. Patient was further admitted for dehydration electrolyte imbalance AKI.     Subjective: Seen and examined this morning, so far tolerating diet clinically feeling better   Assessment and Plan: Principal Problem:   Hypokalemia Active Problems:   AKI (acute kidney injury) (HCC)   Cannabinoid hyperemesis syndrome   Alcohol abuse, in remission   CKD (chronic kidney disease), stage III (HCC)   Severe hypokalemia: 2/2 Nausea vomiting>replaced and resolved. On ivf w/ kcl. Hyponatremia: Improved. AKI on CKD stage II: AKI has resolved with IV fluids encourage intake, on gentle ivf while po intake improves  Nausea vomiting abdominal pain, recurrent Cannabinoid hyperemesis syndrome: Discussed the cessation. Symptoms resolving, diet ADAT.  On full liquid diet changed to soft diet.  Alcohol abuse in remission-stable.  DVT prophylaxis: enoxaparin (LOVENOX) injection 40 mg Start: 08/22/22 2230 Code Status:   Code Status: Full Code Family Communication: plan of care discussed with patient at bedside. Patient status is: Admitted as observation remains hospitalized because of nausea vomiting  hypokalemia and trying oral challenge Level of care: Telemetry   Dispo: The patient is from: Home            Anticipated disposition: Home tomorrow if labs stable and tolerating diet Objective: Vitals last 24 hrs: Vitals:   08/22/22 2022 08/22/22 2252 08/23/22 0419 08/23/22 1415  BP: (!) 183/96 (!) 184/101 (!) 151/104 (!) 156/97  Pulse: 83 90 80 85  Resp: 18 18 18 20   Temp: 98.8 F (37.1 C) 98.3 F (36.8 C) 97.6 F (36.4 C) 97.8 F (36.6 C)  TempSrc: Oral Oral Oral Oral  SpO2: 100% 100% 100% 100%   Weight change:   Physical Examination: General exam: alert awake, older than stated age HEENT:Oral mucosa moist, Ear/Nose WNL grossly Respiratory system: bilaterally CLEAR BS, no use of accessory muscle Cardiovascular system: S1 & S2 +, No JVD. Gastrointestinal system: Abdomen soft,NT,ND, BS+ Nervous System:Alert, awake, moving extremities. Extremities: LE edema NEG,distal peripheral pulses palpable.  Skin: No rashes,no icterus. MSK: Normal muscle bulk,tone, power  Medications reviewed:  Scheduled Meds:  enoxaparin (LOVENOX) injection  40 mg Subcutaneous Q24H   Continuous Infusions:  sodium chloride Stopped (08/22/22 1347)   0.9 % NaCl with KCl 40 mEq / L 125 mL/hr at 08/23/22 0759    Diet Order             DIET SOFT Room service appropriate? Yes; Fluid consistency: Thin  Diet effective now                   Intake/Output Summary (Last 24 hours) at 08/23/2022 1525 Last data filed at 08/23/2022 1000 Gross per 24 hour  Intake 120 ml  Output --  Net 120 ml   Net IO Since Admission: 1,102.42 mL [08/23/22 1525]  Wt Readings from Last 3 Encounters:  08/18/22 74.8 kg  07/18/22 77.1 kg  05/08/22 79.4 kg     Unresulted Labs (From admission, onward)     Start     Ordered   08/29/22 0500  Creatinine, serum  (enoxaparin (LOVENOX)    CrCl >/= 30 ml/min)  Weekly,   R     Comments: while on enoxaparin therapy    08/22/22 2144   08/24/22 0500  Basic metabolic panel   Tomorrow morning,   R        08/23/22 1524          Data Reviewed: I have personally reviewed following labs and imaging studies CBC: Recent Labs  Lab 08/18/22 1559 08/22/22 1118 08/23/22 0705  WBC 19.3* 15.8* 10.2  NEUTROABS 14.5* 12.6*  --   HGB 18.9* 17.5* 14.8  HCT 54.2* 48.6 44.2  MCV 86.4 85.0 91.1  PLT 411* 381 309   Basic Metabolic Panel: Recent Labs  Lab 08/18/22 1559 08/22/22 1118 08/23/22 0705 08/23/22 1300  NA 131* 126* 133* 132*  K 3.3* 2.5* 3.3* 3.9  CL 92* 87* 98 100  CO2 22 23 27 27   GLUCOSE 127* 142* 122* 134*  BUN 40* 32* 25* 20  CREATININE 1.98* 2.23* 1.66* 1.39*  CALCIUM 10.6* 9.8 9.1 8.8*   GFR: Estimated Creatinine Clearance: 76.2 mL/min (A) (by C-G formula based on SCr of 1.39 mg/dL (H)). Liver Function Tests: Recent Labs  Lab 08/18/22 1559 08/22/22 1118 08/23/22 0705  AST 26 26 18   ALT 22 22 19   ALKPHOS 75 62 47  BILITOT 1.4* 1.1 1.1  PROT 10.8* 9.6* 7.6  ALBUMIN 5.4* 5.0 4.1   Recent Results (from the past 240 hour(s))  Urine Culture     Status: None   Collection Time: 08/18/22  3:55 PM   Specimen: Urine, Clean Catch  Result Value Ref Range Status   Specimen Description   Final    URINE, CLEAN CATCH Performed at [facility], [address] [place], [facility] [address]    Special Requests   Final    NONE Performed at [facility], [address] [place], [facility] [address]    Culture   Final    NO GROWTH Performed at [facility], [address], [place], [facility] [address]    Report Status 08/19/2022 FINAL  Final    Antimicrobials: Anti-infectives (From admission, onward)    None      Culture/Microbiology    Component Value Date/Time   SDES  08/18/2022 1555    URINE, CLEAN CATCH Performed at [facility], [address] 14/06/2022 [place], [address] [place]    [facility]  08/18/2022 1555    NONE Performed at [facility], [address] [place], [facility] [address]     CULT  08/18/2022 1555    NO GROWTH Performed at [facility], [address], [place], [facility] [address]    REPTSTATUS 08/19/2022 FINAL 08/18/2022 1555  Radiology Studies: No results found.   LOS: 0 days   [id number], MD [facility]  08/23/2022, 3:25 PM

## 2022-08-23 NOTE — Hospital Course (Addendum)
36 y.o.m w/  male with history of HIV on H ART, previous pancreatitis and alcoholic gastritis, asthma, cannabinoid hyperemesis syndrome, polysubstance abuse, recently seen in the ED on 12/10 with similar complaint presented with nausea vomiting abdominal pain.  Previous visit was on 11/9 for similar complaint-however CT abdomen pelvis showed distended stomach with ingested material nonspecific finding possibly physiologic/SMA syndrome, then had upper GI with contrast no evidence of SMA syndrome. In the ED hemodynamically stable labs with hyponatremia 126 hypokalemia 2.5, AKI creatinine 2.2, normal lipase and liver function, leukocytosis 15.8 but downtrending from 19.3 from 12/11. Patient was further admitted for dehydration electrolyte imbalance AKI.

## 2022-08-24 DIAGNOSIS — E876 Hypokalemia: Secondary | ICD-10-CM | POA: Diagnosis not present

## 2022-08-24 LAB — BASIC METABOLIC PANEL
Anion gap: 7 (ref 5–15)
BUN: 14 mg/dL (ref 6–20)
CO2: 28 mmol/L (ref 22–32)
Calcium: 9.2 mg/dL (ref 8.9–10.3)
Chloride: 97 mmol/L — ABNORMAL LOW (ref 98–111)
Creatinine, Ser: 1.37 mg/dL — ABNORMAL HIGH (ref 0.61–1.24)
GFR, Estimated: >60 mL/min (ref >60)
Glucose, Bld: 137 mg/dL — ABNORMAL HIGH (ref 70–99)
Potassium: 3.5 mmol/L (ref 3.5–5.1)
Sodium: 132 mmol/L — ABNORMAL LOW (ref 135–145)

## 2022-08-24 NOTE — Progress Notes (Signed)
Reviewed discharge paperwork with patient including follow up appointments and medication regimen. Patient walked to main entrance himself, per request.

## 2022-08-24 NOTE — Discharge Summary (Signed)
Physician Discharge Summary  Carlos Guzman [id number] DOB: Sep 21, 1985 DOA: 08/22/2022  PCP: Patient, No Pcp Per  Admit date: 08/22/2022 Discharge date: 08/24/2022 Recommendations for Outpatient Follow-up:  Follow up with PCP in 1 weeks-call for appointment Please obtain BMP/CBC in one week  Discharge Dispo: home Discharge Condition: Stable Code Status:   Code Status: Full Code Diet recommendation:  Diet Order             DIET SOFT Room service appropriate? Yes; Fluid consistency: Thin  Diet effective now                    Brief/Interim Summary: 36 y.o.m w/  male with history of HIV on H ART, previous pancreatitis and alcoholic gastritis, asthma, cannabinoid hyperemesis syndrome, polysubstance abuse, recently seen in the ED on 12/10 with similar complaint presented with nausea vomiting abdominal pain.  Previous visit was on 11/9 for similar complaint-however CT abdomen pelvis showed distended stomach with ingested material nonspecific finding possibly physiologic/SMA syndrome, then had upper GI with contrast no evidence of SMA syndrome. In the ED hemodynamically stable labs with hyponatremia 126 hypokalemia 2.5, AKI creatinine 2.2, normal lipase and liver function, leukocytosis 15.8 but downtrending from 19.3 from 12/11. Patient was further admitted for dehydration electrolyte imbalance AKI. Patient was hydrated with IV fluids diet slowly advanced, electrolytes replaced.  At this time is tolerating diet no more nausea vomiting or abdominal pain.  Labs look if stable and improved.  He feels ready for discharge.  Discharge Diagnoses:  Principal Problem:   Hypokalemia Active Problems:   AKI (acute kidney injury) ([place])   Cannabinoid hyperemesis syndrome   Alcohol abuse, in remission   CKD (chronic kidney disease), stage III (HCC)  Severe hypokalemia: 2/2 Nausea vomiting>replaced and resolved.  Potassium stable. Hyponatremia: Improved. AKI on CKD stage II: AKI has resolved  with IV fluids encourage intake.  Encourage oral intake.  Nausea vomiting abdominal pain, recurrent Cannabinoid hyperemesis syndrome: Discussed the cessation. Symptoms resolved, tolerating soft diet.   Alcohol abuse in remission-stable.  Consults: none Subjective: Alert and oriented, ambulating in the room.  He feels ready for discharge this morning and was on the way out.  Discharge Exam: Vitals:   08/23/22 1958 08/24/22 0431  BP: (!) 149/82 (!) 145/96  Pulse: 78 64  Resp: 16 14  Temp: 98.5 F (36.9 C) 97.7 F (36.5 C)  SpO2: 100% 100%   General: Pt is alert, awake, not in acute distress Cardiovascular: RRR, S1/S2 +, no rubs, no gallops Respiratory: CTA bilaterally, no wheezing, no rhonchi Abdominal: Soft, NT, ND, bowel sounds + Extremities: no edema, no cyanosis  Discharge Instructions  Discharge Instructions     Discharge instructions   Complete by: As directed    Please call call MD or return to ER for similar or worsening recurring problem that brought you to hospital or if any fever,nausea/vomiting,abdominal pain, uncontrolled pain, chest pain,  shortness of breath or any other alarming symptoms.  Please follow-up your doctor as instructed in a week time and call the office for appointment.  Please avoid alcohol, smoking, or any other illicit substance and maintain healthy habits including taking your regular medications as prescribed.  You were cared for by a hospitalist during your hospital stay. If you have any questions about your discharge medications or the care you received while you were in the hospital after you are discharged, you can call the unit and ask to speak with the hospitalist on call if the hospitalist that  took care of you is not available.  Once you are discharged, your primary care physician will handle any further medical issues. Please note that NO REFILLS for any discharge medications will be authorized once you are discharged, as it is  imperative that you return to your primary care physician (or establish a relationship with a primary care physician if you do not have one) for your aftercare needs so that they can reassess your need for medications and monitor your lab values   Increase activity slowly   Complete by: As directed       Allergies as of 08/24/2022       Reactions   Droperidol Other (See Comments)   Extrapyramidal reaction - medicate with benadryl first (Droperidol is a medication that prevents nausea and vomiting caused by surgical procedures. The brand name of this medication is Inapsine.)        Medication List     TAKE these medications    amLODipine 5 MG tablet Commonly known as: NORVASC Take 1 tablet (5 mg total) by mouth daily.   Dovato 50-300 MG tablet Generic drug: dolutegravir-lamiVUDine Take 1 tablet by mouth daily.   promethazine 25 MG suppository Commonly known as: PHENERGAN Place 1 suppository (25 mg total) rectally every 6 (six) hours as needed for nausea or vomiting.        Follow-up Information     [facility] .   Contact information: [address] [place] [address] [phone]               Allergies  Allergen Reactions   Droperidol Other (See Comments)    Extrapyramidal reaction - medicate with benadryl first (Droperidol is a medication that prevents nausea and vomiting caused by surgical procedures. The brand name of this medication is Inapsine.)    The results of significant diagnostics from this hospitalization (including imaging, microbiology, ancillary and laboratory) are listed below for reference.    Microbiology: Recent Results (from the past 240 hour(s))  Urine Culture     Status: None   Collection Time: 08/18/22  3:55 PM   Specimen: Urine, Clean Catch  Result Value Ref Range Status   Specimen Description   Final    URINE, CLEAN CATCH Performed at [facility],  [address] [place], [place] [address]    Special Requests   Final    NONE Performed at [facility], [address] [place], [place] [address]    Culture   Final    NO GROWTH Performed at [facility], [address] [place], [place] [address]    Report Status 08/19/2022 FINAL  Final    Procedures/Studies: No results found.  Labs: BNP (last 3 results) No results for input(s): "BNP" in the last 8760 hours. Basic Metabolic Panel: Recent Labs  Lab 08/18/22 1559 08/22/22 1118 08/23/22 0705 08/23/22 1300 08/24/22 0807  NA 131* 126* 133* 132* 132*  K 3.3* 2.5* 3.3* 3.9 3.5  CL 92* 87* 98 100 97*  CO2 22 23 27 27 28   GLUCOSE 127* 142* 122* 134* 137*  BUN 40* 32* 25* 20 14  CREATININE 1.98* 2.23* 1.66* 1.39* 1.37*  CALCIUM 10.6* 9.8 9.1 8.8* 9.2   Liver Function Tests: Recent Labs  Lab 08/18/22 1559 08/22/22 1118 08/23/22 0705  AST 26 26 18   ALT 22 22 19   ALKPHOS 75 62 47  BILITOT 1.4* 1.1 1.1  PROT 10.8*  9.6* 7.6  ALBUMIN 5.4* 5.0 4.1   Recent Labs  Lab 08/18/22 1559 08/22/22 1118  LIPASE 33 35   No results for input(s): "AMMONIA" in the last 168 hours. CBC: Recent Labs  Lab 08/18/22 1559 08/22/22 1118 08/23/22 0705  WBC 19.3* 15.8* 10.2  NEUTROABS 14.5* 12.6*  --   HGB 18.9* 17.5* 14.8  HCT 54.2* 48.6 44.2  MCV 86.4 85.0 91.1  PLT 411* 381 309   Cardiac Enzymes: No results for input(s): "CKTOTAL", "CKMB", "CKMBINDEX", "TROPONINI" in the last 168 hours. BNP: Invalid input(s): "POCBNP" CBG: No results for input(s): "GLUCAP" in the last 168 hours. D-Dimer No results for input(s): "DDIMER" in the last 72 hours. Hgb A1c No results for input(s): "HGBA1C" in the last 72 hours. Lipid Profile No results for input(s): "CHOL", "HDL", "LDLCALC", "TRIG", "CHOLHDL", "LDLDIRECT" in the last 72 hours. Thyroid function studies No results for input(s): "TSH", "T4TOTAL", "T3FREE", "THYROIDAB" in the last 72 hours.  Invalid input(s):  "FREET3" Anemia work up No results for input(s): "VITAMINB12", "FOLATE", "FERRITIN", "TIBC", "IRON", "RETICCTPCT" in the last 72 hours. Urinalysis    Component Value Date/Time   COLORURINE YELLOW 08/18/2022 1555   APPEARANCEUR CLOUDY (A) 08/18/2022 1555   APPEARANCEUR Clear 11/24/2013 1333   LABSPEC >=1.030 08/18/2022 1555   LABSPEC 1.030 11/24/2013 1333   PHURINE 5.5 08/18/2022 1555   GLUCOSEU NEGATIVE 08/18/2022 1555   GLUCOSEU Negative 11/24/2013 1333   HGBUR MODERATE (A) 08/18/2022 1555   BILIRUBINUR MODERATE (A) 08/18/2022 1555   BILIRUBINUR 1+ 11/24/2013 1333   KETONESUR 15 (A) 08/18/2022 1555   PROTEINUR >=300 (A) 08/18/2022 1555   UROBILINOGEN 1.0 01/04/2015 1651   NITRITE NEGATIVE 08/18/2022 1555   LEUKOCYTESUR NEGATIVE 08/18/2022 1555   LEUKOCYTESUR Negative 11/24/2013 1333   Sepsis Labs Recent Labs  Lab 08/18/22 1559 08/22/22 1118 08/23/22 0705  WBC 19.3* 15.8* 10.2   Microbiology Recent Results (from the past 240 hour(s))  Urine Culture     Status: None   Collection Time: 08/18/22  3:55 PM   Specimen: Urine, Clean Catch  Result Value Ref Range Status   Specimen Description   Final    URINE, CLEAN CATCH Performed at [facility], [address] [place], [place] [address]    Special Requests   Final    NONE Performed at [facility], [address] [place], [place] [address]    Culture   Final    NO GROWTH Performed at [facility], [place] [address], [place], [place] [address]    Report Status 08/19/2022 FINAL  Final   Time coordinating discharge: 25 minutes  SIGNED: Antonieta Pert, MD  [facility] 08/24/2022, 10:53 AM  If 7PM-7AM, please contact night-coverage [email or web address]

## 2022-11-20 IMAGING — CT CT RENAL STONE PROTOCOL
2 of 3 series · 16 of 46 positions shown, 18 images · non-contrast
Comparison: 09/06/2021

CLINICAL DATA: Flank pain, kidney stone



[Series 2: axial st · axial · 0.76mm/px · z∈[-692,-292]mm · 13 of 92 slices shown, 15 images]
[im 6/92  soft-tissue]
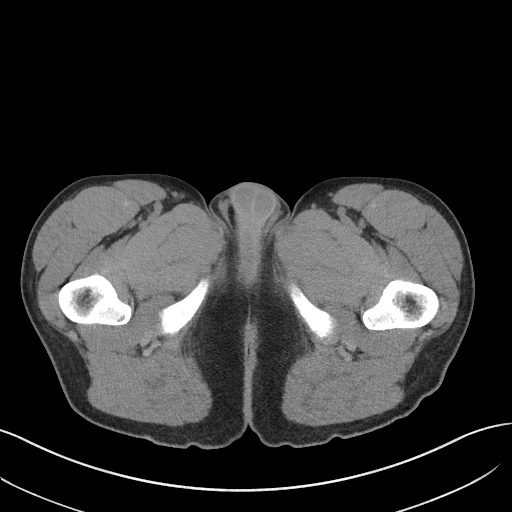
[im 6/92  bone]
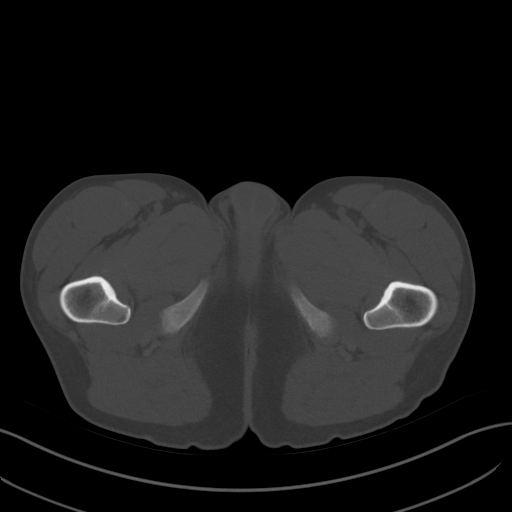
[im 12/92  soft-tissue]
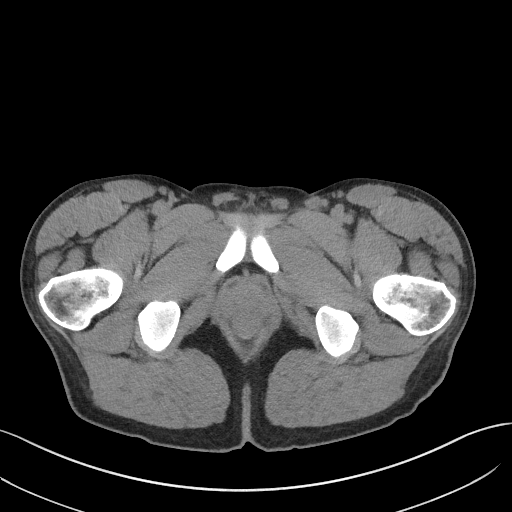
[im 18/92  soft-tissue]
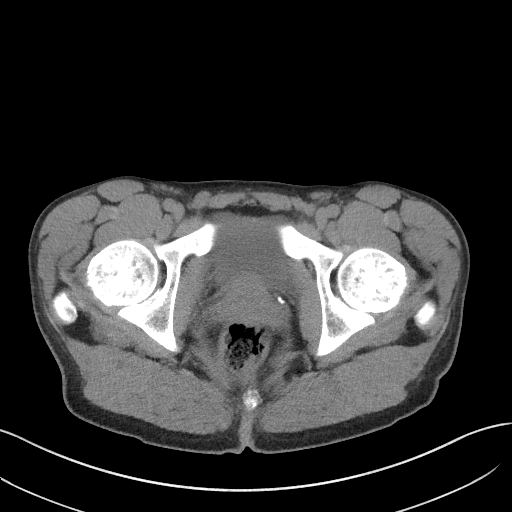
[im 27/92  soft-tissue]
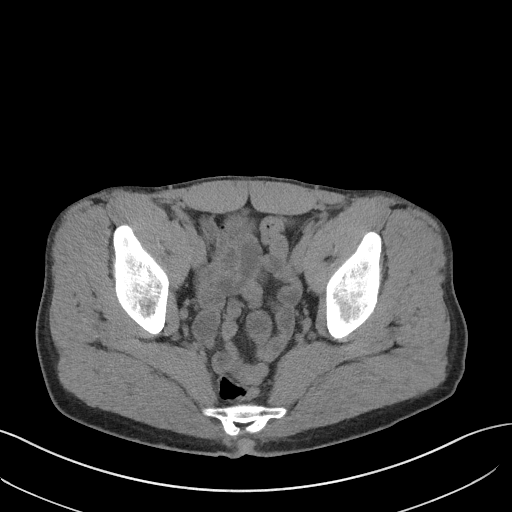
[im 33/92  soft-tissue]
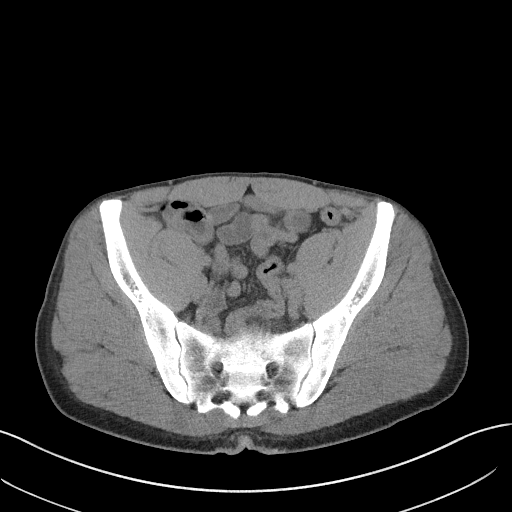
[im 39/92  soft-tissue]
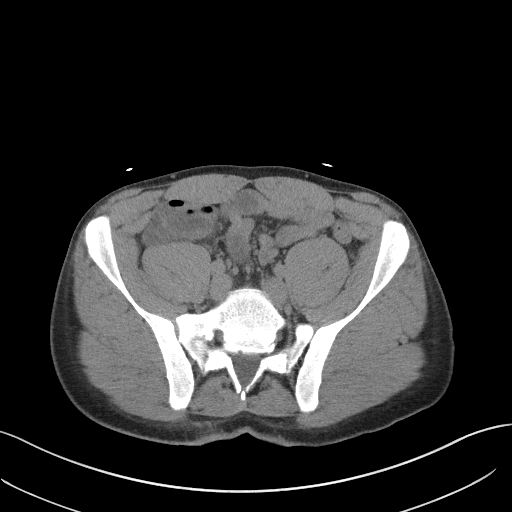
[im 47/92  soft-tissue]
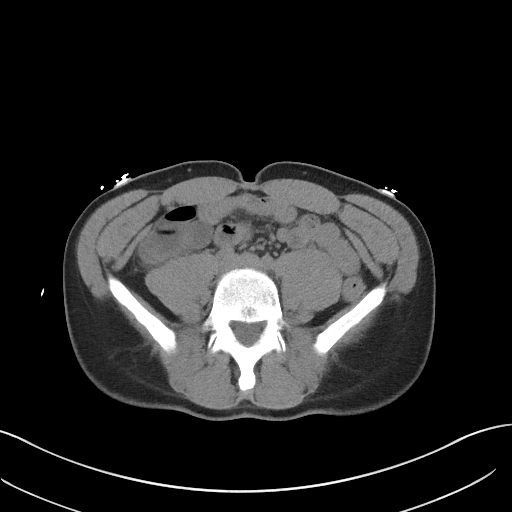
[im 53/92  soft-tissue]
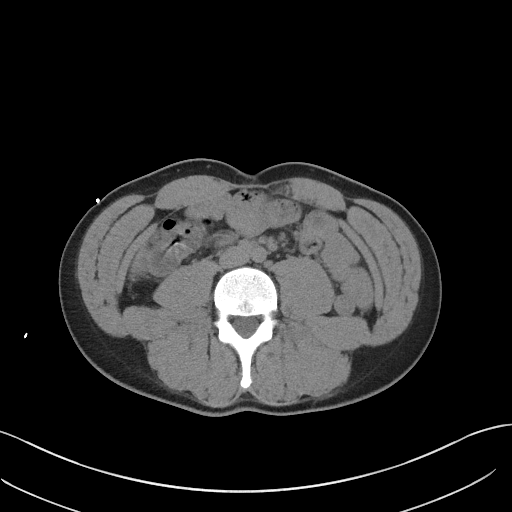
[im 59/92  soft-tissue]
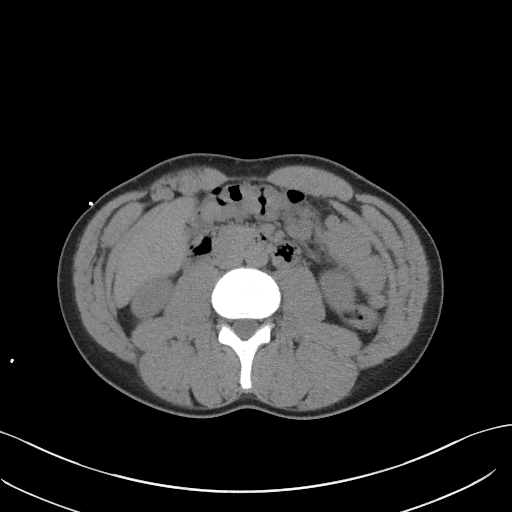
[im 59/92  bone]
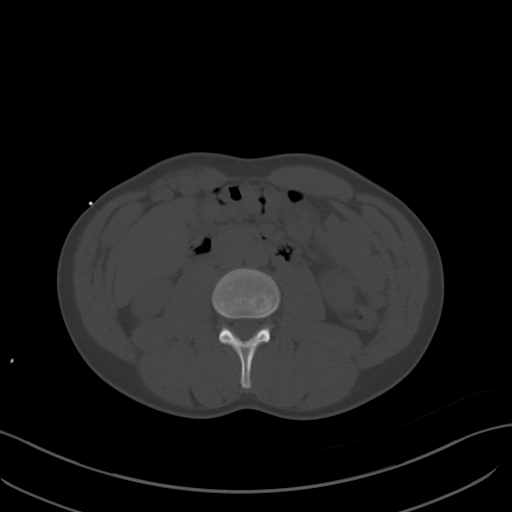
[im 65/92  soft-tissue]
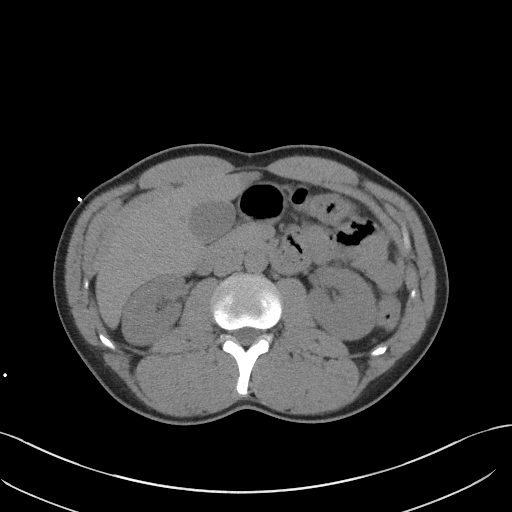
[im 74/92  soft-tissue]
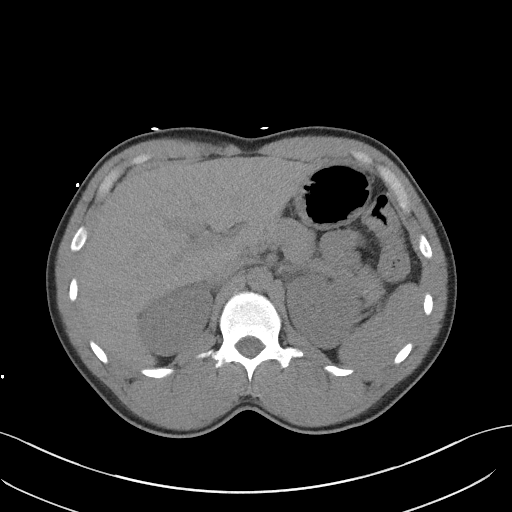
[im 80/92  soft-tissue]
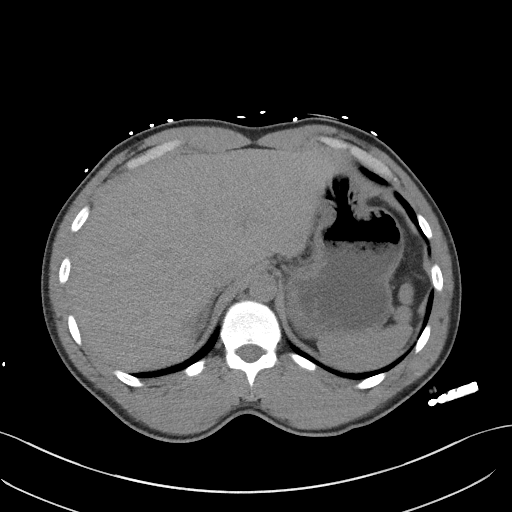
[im 86/92  soft-tissue]
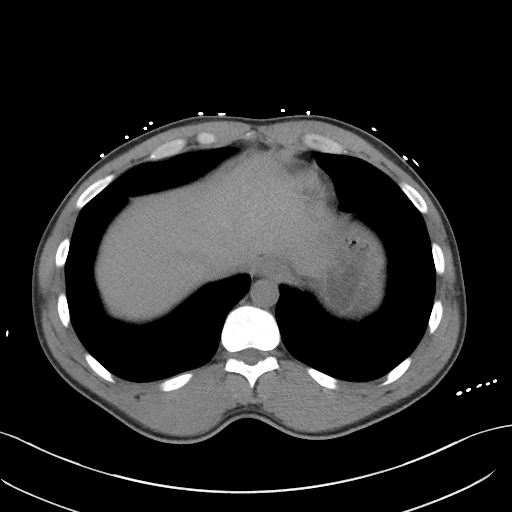

[Series 5: coronal st · coronal · 0.87mm/px · 3 of 97 slices shown]
[im 33/97  soft-tissue]
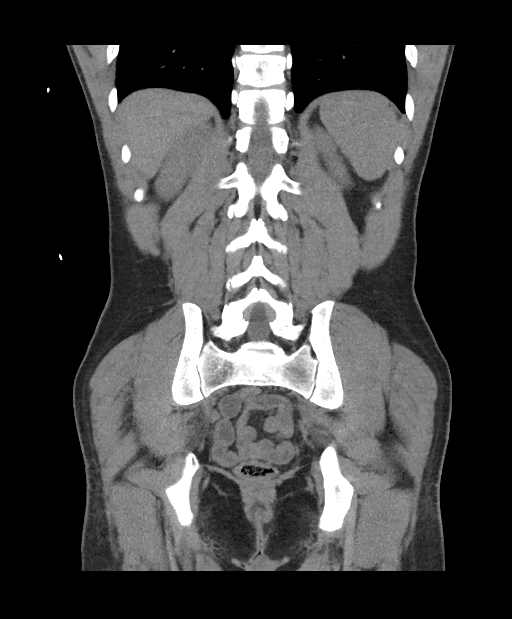
[im 43/97  soft-tissue]
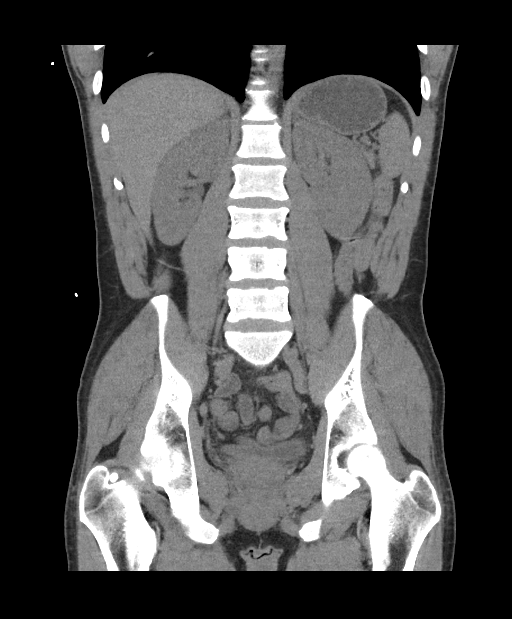
[im 54/97  soft-tissue]
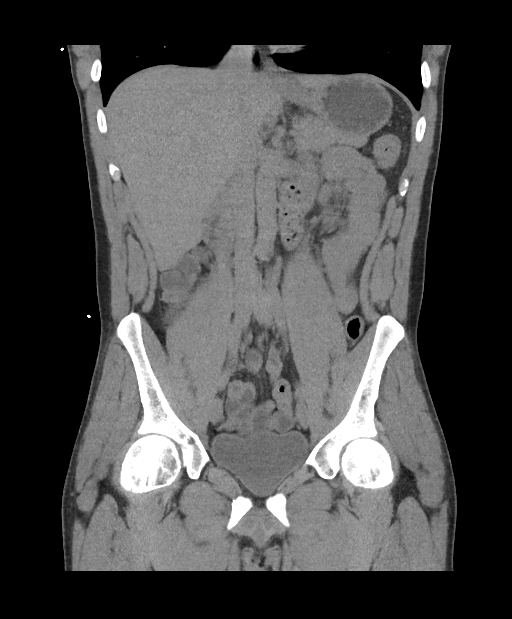

[16 of 46 positions shown; findings below may reference images not displayed]

FINDINGS: Lower chest: Lung bases are clear.

Hepatobiliary: Unenhanced liver is unremarkable.

Gallbladder is unremarkable. No intrahepatic or extrahepatic ductal
dilatation.

Pancreas: Within normal limits.

Spleen: Within normal limits.

Adrenals/Urinary Tract: Adrenal glands are within normal limits.

Kidneys are within normal limits. No renal, ureteral, or bladder
calculi. No hydronephrosis.

Bladder is within normal limits.

Stomach/Bowel: Stomach is within normal limits.

No evidence of bowel obstruction.

Mildly prominent, fluid-filled appendix in the right lower quadrant,
measuring up to 10 mm in diameter (series 2/image 66), without
convincing periappendiceal inflammatory changes but abnormal.

No colonic wall thickening or inflammatory changes.

Vascular/Lymphatic: No evidence of abdominal aortic aneurysm.

No suspicious abdominopelvic lymphadenopathy.

Reproductive: Prostate is unremarkable.

Other: No abdominopelvic ascites.

Musculoskeletal: Visualized osseous structures are within normal
limits.
IMPRESSION: Dilated, fluid-filled appendix, measuring up to 10 mm in diameter,
without convincing periappendiceal inflammatory changes but
abnormal. Correlate for signs/symptoms of early acute appendicitis.

Otherwise negative CT abdomen/pelvis. Specifically, no renal,
ureteral, or bladder calculi. No hydronephrosis.

## 2022-11-27 DIAGNOSIS — E8721 Acute metabolic acidosis: Secondary | ICD-10-CM | POA: Diagnosis not present

## 2022-11-27 DIAGNOSIS — R109 Unspecified abdominal pain: Secondary | ICD-10-CM | POA: Diagnosis not present

## 2022-11-27 DIAGNOSIS — F12188 Cannabis abuse with other cannabis-induced disorder: Secondary | ICD-10-CM | POA: Diagnosis not present

## 2022-11-27 DIAGNOSIS — B20 Human immunodeficiency virus [HIV] disease: Secondary | ICD-10-CM | POA: Diagnosis not present

## 2022-11-27 DIAGNOSIS — N183 Chronic kidney disease, stage 3 unspecified: Secondary | ICD-10-CM | POA: Diagnosis not present

## 2022-11-27 DIAGNOSIS — M791 Myalgia, unspecified site: Secondary | ICD-10-CM | POA: Diagnosis not present

## 2022-11-28 DIAGNOSIS — N179 Acute kidney failure, unspecified: Secondary | ICD-10-CM | POA: Diagnosis not present

## 2022-12-23 ENCOUNTER — Encounter: Payer: Self-pay | Admitting: *Deleted

## 2023-01-15 ENCOUNTER — Other Ambulatory Visit: Payer: Self-pay

## 2023-01-15 ENCOUNTER — Encounter (HOSPITAL_BASED_OUTPATIENT_CLINIC_OR_DEPARTMENT_OTHER): Payer: Self-pay | Admitting: Emergency Medicine

## 2023-01-15 ENCOUNTER — Emergency Department (HOSPITAL_BASED_OUTPATIENT_CLINIC_OR_DEPARTMENT_OTHER)
Admission: EM | Admit: 2023-01-15 | Discharge: 2023-01-16 | Disposition: A | Discharge status: Home / Self Care | Payer: BC Managed Care – PPO | Attending: Emergency Medicine | Admitting: Emergency Medicine

## 2023-01-15 DIAGNOSIS — E876 Hypokalemia: Secondary | ICD-10-CM | POA: Insufficient documentation

## 2023-01-15 DIAGNOSIS — N189 Chronic kidney disease, unspecified: Secondary | ICD-10-CM | POA: Insufficient documentation

## 2023-01-15 DIAGNOSIS — R112 Nausea with vomiting, unspecified: Secondary | ICD-10-CM | POA: Diagnosis present

## 2023-01-15 DIAGNOSIS — Z21 Asymptomatic human immunodeficiency virus [HIV] infection status: Secondary | ICD-10-CM | POA: Diagnosis not present

## 2023-01-15 LAB — COMPREHENSIVE METABOLIC PANEL
ALT: 25 U/L (ref 0–44)
AST: 31 U/L (ref 15–41)
Albumin: 5.1 g/dL — ABNORMAL HIGH (ref 3.5–5.0)
Alkaline Phosphatase: 63 U/L (ref 38–126)
Anion gap: 16 — ABNORMAL HIGH (ref 5–15)
BUN: 23 mg/dL — ABNORMAL HIGH (ref 6–20)
CO2: 20 mmol/L — ABNORMAL LOW (ref 22–32)
Calcium: 10.4 mg/dL — ABNORMAL HIGH (ref 8.9–10.3)
Chloride: 101 mmol/L (ref 98–111)
Creatinine, Ser: 1.38 mg/dL — ABNORMAL HIGH (ref 0.61–1.24)
GFR, Estimated: >60 mL/min (ref >60)
Glucose, Bld: 157 mg/dL — ABNORMAL HIGH (ref 70–99)
Potassium: 3.4 mmol/L — ABNORMAL LOW (ref 3.5–5.1)
Sodium: 137 mmol/L (ref 135–145)
Total Bilirubin: 1 mg/dL (ref 0.3–1.2)
Total Protein: 9.7 g/dL — ABNORMAL HIGH (ref 6.5–8.1)

## 2023-01-15 LAB — CBC
HCT: 47.6 % (ref 39.0–52.0)
Hemoglobin: 16.2 g/dL (ref 13.0–17.0)
MCH: 29.9 pg (ref 26.0–34.0)
MCHC: 34 g/dL (ref 30.0–36.0)
MCV: 87.8 fL (ref 80.0–100.0)
Platelets: 354 10*3/uL (ref 150–400)
RBC: 5.42 MIL/uL (ref 4.22–5.81)
RDW: 13.9 % (ref 11.5–15.5)
WBC: 19.9 10*3/uL — ABNORMAL HIGH (ref 4.0–10.5)
nRBC: 0 % (ref 0.0–0.2)

## 2023-01-15 LAB — LIPASE, BLOOD: Lipase: 30 U/L (ref 11–51)

## 2023-01-15 MED ORDER — METOCLOPRAMIDE HCL 5 MG/ML IJ SOLN
10.0000 mg | Freq: Once | INTRAMUSCULAR | Status: AC
  Administered 2023-01-15: 10 mg via INTRAVENOUS
  Filled 2023-01-15: qty 2

## 2023-01-15 MED ORDER — DIPHENHYDRAMINE HCL 50 MG/ML IJ SOLN
12.5000 mg | Freq: Once | INTRAMUSCULAR | Status: AC
  Administered 2023-01-15: 12.5 mg via INTRAVENOUS
  Filled 2023-01-15: qty 1

## 2023-01-15 MED ORDER — SODIUM CHLORIDE 0.9 % IV BOLUS
2000.0000 mL | Freq: Once | INTRAVENOUS | Status: AC
  Administered 2023-01-15: 2000 mL via INTRAVENOUS

## 2023-01-15 MED ORDER — METOCLOPRAMIDE HCL 10 MG PO TABS: 10.0000 mg | ORAL_TABLET | Freq: Three times a day (TID) | ORAL | 1 refills | Status: AC

## 2023-01-15 MED ORDER — HYDROMORPHONE HCL 1 MG/ML IJ SOLN
1.0000 mg | Freq: Once | INTRAMUSCULAR | Status: AC
  Administered 2023-01-15: 1 mg via INTRAVENOUS
  Filled 2023-01-15: qty 1

## 2023-01-15 NOTE — ED Triage Notes (Signed)
Pt c/o mild abd and vomiting; hx of pancreatitis and sts this feels similar

## 2023-01-15 NOTE — Discharge Instructions (Signed)
See your Physician for recheck.  Return if any problems.  °

## 2023-01-15 NOTE — ED Provider Notes (Signed)
[place] [facility] Provider Note   CSN: [id number] Arrival date & time: 01/15/23  1943     History  Chief Complaint  Patient presents with   Emesis    Carlos Guzman is a 37 y.o. male.  Patient complains of nominal pain.  Patient reports that he feels like he has pancreatitis.  Patient reports that he has a history of pancreatitis in the past.  Patient also reports that he has had hyperemesis from [facility].  Patient has a history of acute kidney injury hypokalemia and HIV.  Patient reports he has vomited multiple times tonight   Emesis      Home Medications Prior to Admission medications   Medication Sig Start Date End Date Taking? Authorizing Provider  amLODipine (NORVASC) 5 MG tablet Take 1 tablet (5 mg total) by mouth daily. Patient not taking: Reported on 07/18/2022 05/08/22   Al Decant, PA-C  DOVATO 50-300 MG tablet Take 1 tablet by mouth daily. 07/16/21   [provider]  promethazine (PHENERGAN) 25 MG suppository Place 1 suppository (25 mg total) rectally every 6 (six) hours as needed for nausea or vomiting. Patient not taking: Reported on 08/23/2022 08/18/22   Jeannie Fend, PA-C  metoCLOPramide (REGLAN) 10 MG tablet Take 1 tablet (10 mg total) by mouth every 6 (six) hours as needed for nausea or vomiting. Patient not taking: No sig reported 06/10/19 11/17/20  Molpus, Jonny Ruiz, MD  sucralfate (CARAFATE) 1 g tablet Take 1 tablet (1 g total) by mouth 4 (four) times daily -  with meals and at bedtime. 06/10/19 06/10/19  Molpus, Jonny Ruiz, MD      Allergies    Droperidol    Review of Systems   Review of Systems  Gastrointestinal:  Positive for vomiting.  All other systems reviewed and are negative.   Physical Exam Updated Vital Signs BP (!) 152/102   Pulse 89   Temp 98.1 F (36.7 C)   Resp 20   Ht 6\' 1"  (1.854 m)   Wt 79.4 kg   SpO2 100%   BMI 23.09 kg/m  Physical Exam Vitals and nursing note reviewed.   Constitutional:      Appearance: He is well-developed.  HENT:     Head: Normocephalic.     Mouth/Throat:     Mouth: Mucous membranes are moist.  Cardiovascular:     Rate and Rhythm: Normal rate.  Pulmonary:     Effort: Pulmonary effort is normal.  Abdominal:     General: Abdomen is flat. There is no distension.  Musculoskeletal:        General: Normal range of motion.     Cervical back: Normal range of motion.  Skin:    General: Skin is warm.  Neurological:     General: No focal deficit present.     Mental Status: He is alert and oriented to person, place, and time.  Psychiatric:        Mood and Affect: Mood normal.     ED Results / Procedures / Treatments   Labs (all labs ordered are listed, but only abnormal results are displayed) Labs Reviewed  COMPREHENSIVE METABOLIC PANEL - Abnormal; Notable for the following components:      Result Value   Potassium 3.4 (*)    CO2 20 (*)    Glucose, Bld 157 (*)    BUN 23 (*)    Creatinine, Ser 1.38 (*)    Calcium 10.4 (*)    Total Protein 9.7 (*)  Albumin 5.1 (*)    Anion gap 16 (*)    All other components within normal limits  CBC - Abnormal; Notable for the following components:   WBC 19.9 (*)    All other components within normal limits  LIPASE, BLOOD  URINALYSIS, ROUTINE W REFLEX MICROSCOPIC    EKG None  Radiology No results found.  Procedures Procedures    Medications Ordered in ED Medications  HYDROmorphone (DILAUDID) injection 1 mg (1 mg Intravenous Given 01/15/23 2142)  metoCLOPramide (REGLAN) injection 10 mg (10 mg Intravenous Given 01/15/23 2141)  diphenhydrAMINE (BENADRYL) injection 12.5 mg (12.5 mg Intravenous Given 01/15/23 2141)  sodium chloride 0.9 % bolus 2,000 mL (2,000 mLs Intravenous New Bag/Given 01/15/23 2301)    ED Course/ Medical Decision Making/ A&P                             Medical Decision Making Patient complains of abdominal pain and vomiting.  Patient feels like he has  pancreatitis.  He reports he has pancreatitis multiple times in the past  Amount and/or Complexity of Data Reviewed Labs: ordered. Decision-making details documented in ED Course.    Details: Labs ordered reviewed and interpreted patient's lipase is normal  Risk Prescription drug management. Risk Details: Given Dilaudid 1 mg IV Reglan 10 mg and Benadryl 12.5 patient is given IV fluids x 2 L.  Patient reports feeling much better           Final Clinical Impression(s) / ED Diagnoses Final diagnoses:  Nausea and vomiting, unspecified vomiting type  Chronic kidney disease, unspecified CKD stage    Rx / DC Orders ED Discharge Orders          Ordered    metoCLOPramide (REGLAN) 10 MG tablet  3 times daily with meals        01/15/23 2345          An After Visit Summary was printed and given to the patient.     Elson Areas, [place] 01/15/23 2346    Tegeler, Canary Brim, MD 01/16/23 [phone]

## 2023-01-16 DIAGNOSIS — N189 Chronic kidney disease, unspecified: Secondary | ICD-10-CM | POA: Diagnosis not present

## 2023-01-17 ENCOUNTER — Telehealth: Payer: Self-pay

## 2023-01-17 NOTE — Transitions of Care (Post Inpatient/ED Visit) (Signed)
   01/17/2023  Name: Carlos Guzman MRN: [id number] DOB: [birth year]  Today's TOC FU Call Status: Today's TOC FU Call Status:: Unsuccessul Call (1st Attempt) Unsuccessful Call (1st Attempt) Date: 01/17/23  Attempted to reach the patient regarding the most recent Inpatient/ED visit.  Follow Up Plan: Additional outreach attempts will be made to reach the patient to complete the Transitions of Care (Post Inpatient/ED visit) call.   Abelino Derrick, MHA [facility]  Managed [facility] Social Worker [phone]

## 2023-10-17 ENCOUNTER — Other Ambulatory Visit: Payer: Self-pay

## 2023-10-17 ENCOUNTER — Encounter (HOSPITAL_BASED_OUTPATIENT_CLINIC_OR_DEPARTMENT_OTHER): Payer: Self-pay | Admitting: Emergency Medicine

## 2023-10-17 ENCOUNTER — Emergency Department (HOSPITAL_BASED_OUTPATIENT_CLINIC_OR_DEPARTMENT_OTHER)
Admission: EM | Admit: 2023-10-17 | Discharge: 2023-10-17 | Disposition: A | Discharge status: Home / Self Care | Payer: Medicaid Other | Attending: Emergency Medicine | Admitting: Emergency Medicine

## 2023-10-17 DIAGNOSIS — Z20822 Contact with and (suspected) exposure to covid-19: Secondary | ICD-10-CM | POA: Insufficient documentation

## 2023-10-17 DIAGNOSIS — J101 Influenza due to other identified influenza virus with other respiratory manifestations: Secondary | ICD-10-CM | POA: Diagnosis not present

## 2023-10-17 DIAGNOSIS — M791 Myalgia, unspecified site: Secondary | ICD-10-CM | POA: Diagnosis present

## 2023-10-17 LAB — COMPREHENSIVE METABOLIC PANEL
ALT: 25 U/L (ref 0–44)
AST: 29 U/L (ref 15–41)
Albumin: 3.9 g/dL (ref 3.5–5.0)
Alkaline Phosphatase: 51 U/L (ref 38–126)
Anion gap: 10 (ref 5–15)
BUN: 19 mg/dL (ref 6–20)
CO2: 26 mmol/L (ref 22–32)
Calcium: 9.1 mg/dL (ref 8.9–10.3)
Chloride: 97 mmol/L — ABNORMAL LOW (ref 98–111)
Creatinine, Ser: 1.46 mg/dL — ABNORMAL HIGH (ref 0.61–1.24)
GFR, Estimated: >60 mL/min (ref >60)
Glucose, Bld: 124 mg/dL — ABNORMAL HIGH (ref 70–99)
Potassium: 3.6 mmol/L (ref 3.5–5.1)
Sodium: 133 mmol/L — ABNORMAL LOW (ref 135–145)
Total Bilirubin: 0.3 mg/dL (ref 0.0–1.2)
Total Protein: 7.7 g/dL (ref 6.5–8.1)

## 2023-10-17 LAB — RESP PANEL BY RT-PCR (RSV, FLU A&B, COVID)  RVPGX2
Influenza A by PCR: POSITIVE — AB
Influenza B by PCR: NEGATIVE
Resp Syncytial Virus by PCR: NEGATIVE
SARS Coronavirus 2 by RT PCR: NEGATIVE

## 2023-10-17 LAB — URINALYSIS, MICROSCOPIC (REFLEX)

## 2023-10-17 LAB — CBC
HCT: 49.4 % (ref 39.0–52.0)
Hemoglobin: 17 g/dL (ref 13.0–17.0)
MCH: 31.5 pg (ref 26.0–34.0)
MCHC: 34.4 g/dL (ref 30.0–36.0)
MCV: 91.5 fL (ref 80.0–100.0)
Platelets: 209 10*3/uL (ref 150–400)
RBC: 5.4 MIL/uL (ref 4.22–5.81)
RDW: 12.5 % (ref 11.5–15.5)
WBC: 9.3 10*3/uL (ref 4.0–10.5)
nRBC: 0 % (ref 0.0–0.2)

## 2023-10-17 LAB — URINALYSIS, ROUTINE W REFLEX MICROSCOPIC
Bilirubin Urine: NEGATIVE
Glucose, UA: NEGATIVE mg/dL
Hgb urine dipstick: NEGATIVE
Ketones, ur: NEGATIVE mg/dL
Leukocytes,Ua: NEGATIVE
Nitrite: NEGATIVE
Protein, ur: 30 mg/dL — AB
Specific Gravity, Urine: >=1.03 (ref 1.005–1.030)
pH: 6 (ref 5.0–8.0)

## 2023-10-17 LAB — LIPASE, BLOOD: Lipase: 61 U/L — ABNORMAL HIGH (ref 11–51)

## 2023-10-17 LAB — CK: Total CK: 174 U/L (ref 49–397)

## 2023-10-17 MED ORDER — KETOROLAC TROMETHAMINE 15 MG/ML IJ SOLN
15.0000 mg | Freq: Once | INTRAMUSCULAR | Status: AC
  Administered 2023-10-17: 15 mg via INTRAVENOUS
  Filled 2023-10-17: qty 1

## 2023-10-17 MED ORDER — LACTATED RINGERS IV BOLUS
1000.0000 mL | Freq: Once | INTRAVENOUS | Status: AC
  Administered 2023-10-17: 1000 mL via INTRAVENOUS

## 2023-10-17 NOTE — ED Notes (Signed)
 Gave pt [place] ice and crackers

## 2023-10-17 NOTE — ED Provider Notes (Signed)
 [place] [facility] Provider Note   CSN: [id number] Arrival date & time: 10/17/23  1715     History  Chief Complaint  Patient presents with   URI   Diarrhea    Carlos Guzman is a 38 y.o. male.  38 year old male presents today for concern of bodyaches, cough, rhinorrhea, poor p.o. intake, nausea, vomiting, diarrhea starting last night.  Endorses dark urine.  No episodes today.   The history is provided by the patient. No language interpreter was used.       Home Medications Prior to Admission medications   Medication Sig Start Date End Date Taking? Authorizing Provider  amLODipine  (NORVASC ) 5 MG tablet Take 1 tablet (5 mg total) by mouth daily. Patient not taking: Reported on 07/18/2022 05/08/22   Ruthell Lonni FALCON, PA-C  DOVATO  50-300 MG tablet Take 1 tablet by mouth daily. 07/16/21   [provider]  metoCLOPramide  (REGLAN ) 10 MG tablet Take 1 tablet (10 mg total) by mouth 3 (three) times daily with meals. 01/15/23 01/15/24  Sofia, Leslie K, PA-C  promethazine  (PHENERGAN ) 25 MG suppository Place 1 suppository (25 mg total) rectally every 6 (six) hours as needed for nausea or vomiting. Patient not taking: Reported on 08/23/2022 08/18/22   Beverley Doffing A, PA-C  sucralfate  (CARAFATE ) 1 g tablet Take 1 tablet (1 g total) by mouth 4 (four) times daily -  with meals and at bedtime. 06/10/19 06/10/19  Molpus, John, MD      Allergies    Droperidol     Review of Systems   Review of Systems  Constitutional:  Positive for fever. Negative for chills.  HENT:  Positive for congestion. Negative for trouble swallowing.   Respiratory:  Positive for cough. Negative for shortness of breath.   Gastrointestinal:  Positive for nausea and vomiting. Negative for abdominal pain.  Neurological:  Negative for light-headedness.  All other systems reviewed and are negative.   Physical Exam Updated Vital Signs BP 124/81   Pulse 74   Temp 98.2 F (36.8  C) (Oral)   Resp 18   Ht 6' 1 (1.854 m)   Wt 83.9 kg   SpO2 100%   BMI 24.41 kg/m  Physical Exam Vitals and nursing note reviewed.  Constitutional:      General: He is not in acute distress.    Appearance: Normal appearance. He is not ill-appearing.  HENT:     Head: Normocephalic and atraumatic.     Nose: Nose normal.  Eyes:     General: No scleral icterus.    Extraocular Movements: Extraocular movements intact.     Conjunctiva/sclera: Conjunctivae normal.  Cardiovascular:     Rate and Rhythm: Normal rate and regular rhythm.  Pulmonary:     Effort: Pulmonary effort is normal. No respiratory distress.     Breath sounds: Normal breath sounds. No wheezing or rales.  Musculoskeletal:        General: Normal range of motion.     Cervical back: Normal range of motion.  Skin:    General: Skin is warm and dry.  Neurological:     General: No focal deficit present.     Mental Status: He is alert. Mental status is at baseline.     ED Results / Procedures / Treatments   Labs (all labs ordered are listed, but only abnormal results are displayed) Labs Reviewed  RESP PANEL BY RT-PCR (RSV, FLU A&B, COVID)  RVPGX2 - Abnormal; Notable for the following components:  Result Value   Influenza A by PCR POSITIVE (*)    All other components within normal limits  LIPASE, BLOOD - Abnormal; Notable for the following components:   Lipase 61 (*)    All other components within normal limits  COMPREHENSIVE METABOLIC PANEL - Abnormal; Notable for the following components:   Sodium 133 (*)    Chloride 97 (*)    Glucose, Bld 124 (*)    Creatinine, Ser 1.46 (*)    All other components within normal limits  URINALYSIS, ROUTINE W REFLEX MICROSCOPIC - Abnormal; Notable for the following components:   Protein, ur 30 (*)    All other components within normal limits  URINALYSIS, MICROSCOPIC (REFLEX) - Abnormal; Notable for the following components:   Bacteria, UA RARE (*)    All other components  within normal limits  CBC  CK    EKG None  Radiology No results found.  Procedures Procedures    Medications Ordered in ED Medications  ketorolac  (TORADOL ) 15 MG/ML injection 15 mg (15 mg Intravenous Given 10/17/23 2052)  lactated ringers  bolus 1,000 mL (0 mLs Intravenous Stopped 10/17/23 2206)    ED Course/ Medical Decision Making/ A&P                                 Medical Decision Making Amount and/or Complexity of Data Reviewed Labs: ordered.  Risk Prescription drug management.   38 year old male presents today for concern of URI symptoms.  Influenza A positive.  UA without evidence of UTI.  Yellow in color.  CK within normal.  Lipase mildly elevated but does not meet criteria for pancreatitis.  He is also without abdominal pain.  CBC unremarkable.  CMP shows glucose of 124, creatinine 1.46.  No other acute concerns.  He was given fluid bolus.  As well as small dose of Toradol  to help with his body aches.  He is otherwise stable for discharge.  Discharged in stable condition.  He is feeling improved and tolerating p.o. intake.   Final Clinical Impression(s) / ED Diagnoses Final diagnoses:  Influenza A    Rx / DC Orders ED Discharge Orders     None         Hildegard Loge, PA-C 10/17/23 2210    Geraldene Hamilton, MD 10/17/23 2356

## 2023-10-17 NOTE — ED Triage Notes (Signed)
 PtPOV steady gait- c/o cough, runny nose, muscle aches, poor po intake. N/v/d last night. Symptomatic fever, dark urine.  Denies otc meds. No emesis today.

## 2023-10-17 NOTE — Discharge Instructions (Signed)
 Your workup was reassuring.  You received fluids, Toradol .  You are able to tolerate p.o. fluids in the emergency department.  Follow-up with your primary care provider.  Return for any concerning symptoms.

## 2023-10-17 NOTE — ED Notes (Signed)
 Pt feels better now, he has tolerated the ginger ale and crackers and is resting.

## 2023-12-08 ENCOUNTER — Other Ambulatory Visit: Payer: Self-pay

## 2023-12-08 ENCOUNTER — Emergency Department (HOSPITAL_BASED_OUTPATIENT_CLINIC_OR_DEPARTMENT_OTHER)
Admission: EM | Admit: 2023-12-08 | Discharge: 2023-12-08 | Discharge status: Against Medical Advice | Attending: Emergency Medicine | Admitting: Emergency Medicine

## 2023-12-08 ENCOUNTER — Emergency Department (HOSPITAL_BASED_OUTPATIENT_CLINIC_OR_DEPARTMENT_OTHER)

## 2023-12-08 ENCOUNTER — Encounter (HOSPITAL_BASED_OUTPATIENT_CLINIC_OR_DEPARTMENT_OTHER): Payer: Self-pay | Admitting: Urology

## 2023-12-08 DIAGNOSIS — R112 Nausea with vomiting, unspecified: Secondary | ICD-10-CM | POA: Diagnosis not present

## 2023-12-08 DIAGNOSIS — J45909 Unspecified asthma, uncomplicated: Secondary | ICD-10-CM | POA: Insufficient documentation

## 2023-12-08 DIAGNOSIS — Z21 Asymptomatic human immunodeficiency virus [HIV] infection status: Secondary | ICD-10-CM | POA: Insufficient documentation

## 2023-12-08 DIAGNOSIS — M791 Myalgia, unspecified site: Secondary | ICD-10-CM | POA: Insufficient documentation

## 2023-12-08 DIAGNOSIS — N179 Acute kidney failure, unspecified: Secondary | ICD-10-CM | POA: Diagnosis not present

## 2023-12-08 DIAGNOSIS — Z79899 Other long term (current) drug therapy: Secondary | ICD-10-CM | POA: Insufficient documentation

## 2023-12-08 DIAGNOSIS — I509 Heart failure, unspecified: Secondary | ICD-10-CM | POA: Diagnosis not present

## 2023-12-08 DIAGNOSIS — E860 Dehydration: Secondary | ICD-10-CM | POA: Diagnosis present

## 2023-12-08 DIAGNOSIS — R197 Diarrhea, unspecified: Secondary | ICD-10-CM | POA: Insufficient documentation

## 2023-12-08 DIAGNOSIS — N3289 Other specified disorders of bladder: Secondary | ICD-10-CM | POA: Diagnosis not present

## 2023-12-08 DIAGNOSIS — R109 Unspecified abdominal pain: Secondary | ICD-10-CM | POA: Diagnosis not present

## 2023-12-08 LAB — CBC WITH DIFFERENTIAL/PLATELET
Abs Immature Granulocytes: 0.11 10*3/uL — ABNORMAL HIGH (ref 0.00–0.07)
Basophils Absolute: 0.1 10*3/uL (ref 0.0–0.1)
Basophils Relative: 1 %
Eosinophils Absolute: 0 10*3/uL (ref 0.0–0.5)
Eosinophils Relative: 0 %
HCT: 53.6 % — ABNORMAL HIGH (ref 39.0–52.0)
Hemoglobin: 18.4 g/dL — ABNORMAL HIGH (ref 13.0–17.0)
Immature Granulocytes: 1 %
Lymphocytes Relative: 16 %
Lymphs Abs: 2.9 10*3/uL (ref 0.7–4.0)
MCH: 30.3 pg (ref 26.0–34.0)
MCHC: 34.3 g/dL (ref 30.0–36.0)
MCV: 88.3 fL (ref 80.0–100.0)
Monocytes Absolute: 0.9 10*3/uL (ref 0.1–1.0)
Monocytes Relative: 5 %
Neutro Abs: 13.6 10*3/uL — ABNORMAL HIGH (ref 1.7–7.7)
Neutrophils Relative %: 77 %
Platelets: 308 10*3/uL (ref 150–400)
RBC: 6.07 MIL/uL — ABNORMAL HIGH (ref 4.22–5.81)
RDW: 13.2 % (ref 11.5–15.5)
Smear Review: NORMAL
WBC: 18 10*3/uL — ABNORMAL HIGH (ref 4.0–10.5)
nRBC: 0 % (ref 0.0–0.2)

## 2023-12-08 LAB — COMPREHENSIVE METABOLIC PANEL WITH GFR
ALT: 70 U/L — ABNORMAL HIGH (ref 0–44)
AST: 50 U/L — ABNORMAL HIGH (ref 15–41)
Albumin: 5.2 g/dL — ABNORMAL HIGH (ref 3.5–5.0)
Alkaline Phosphatase: 61 U/L (ref 38–126)
Anion gap: 17 — ABNORMAL HIGH (ref 5–15)
BUN: 33 mg/dL — ABNORMAL HIGH (ref 6–20)
CO2: 23 mmol/L (ref 22–32)
Calcium: 10.4 mg/dL — ABNORMAL HIGH (ref 8.9–10.3)
Chloride: 95 mmol/L — ABNORMAL LOW (ref 98–111)
Creatinine, Ser: 2.65 mg/dL — ABNORMAL HIGH (ref 0.61–1.24)
GFR, Estimated: 30 mL/min — ABNORMAL LOW (ref >60)
Glucose, Bld: 133 mg/dL — ABNORMAL HIGH (ref 70–99)
Potassium: 4.2 mmol/L (ref 3.5–5.1)
Sodium: 135 mmol/L (ref 135–145)
Total Bilirubin: 0.8 mg/dL (ref 0.0–1.2)
Total Protein: 10.5 g/dL — ABNORMAL HIGH (ref 6.5–8.1)

## 2023-12-08 LAB — MAGNESIUM: Magnesium: 2.4 mg/dL (ref 1.7–2.4)

## 2023-12-08 LAB — LIPASE, BLOOD: Lipase: 27 U/L (ref 11–51)

## 2023-12-08 LAB — CK: Total CK: 450 U/L — ABNORMAL HIGH (ref 49–397)

## 2023-12-08 MED ORDER — CYCLOBENZAPRINE HCL 10 MG PO TABS
10.0000 mg | ORAL_TABLET | Freq: Once | ORAL | Status: AC
  Administered 2023-12-08: 10 mg via ORAL
  Filled 2023-12-08: qty 1

## 2023-12-08 MED ORDER — FOLIC ACID 1 MG PO TABS
1.0000 mg | ORAL_TABLET | Freq: Once | ORAL | Status: AC
  Administered 2023-12-08: 1 mg via ORAL
  Filled 2023-12-08: qty 1

## 2023-12-08 MED ORDER — THIAMINE HCL 100 MG PO TABS
100.0000 mg | ORAL_TABLET | Freq: Once | ORAL | Status: AC
  Administered 2023-12-08: 100 mg via ORAL
  Filled 2023-12-08 (×2): qty 1

## 2023-12-08 MED ORDER — ADULT MULTIVITAMIN W/MINERALS CH
1.0000 | ORAL_TABLET | Freq: Once | ORAL | Status: AC
  Administered 2023-12-08: 1 via ORAL
  Filled 2023-12-08: qty 1

## 2023-12-08 MED ORDER — SODIUM CHLORIDE 0.9 % IV BOLUS
1000.0000 mL | Freq: Once | INTRAVENOUS | Status: AC
  Administered 2023-12-08: 1000 mL via INTRAVENOUS

## 2023-12-08 MED ORDER — ONDANSETRON HCL 4 MG PO TABS: 4.0000 mg | ORAL_TABLET | Freq: Four times a day (QID) | ORAL | 0 refills | Status: DC

## 2023-12-08 NOTE — ED Notes (Signed)
 Pt. Pulled his IV out and has gone out to smoke.  Pt. Carlos Guzman he is leaving and will go to [facility] in the morning

## 2023-12-08 NOTE — ED Provider Notes (Cosign Needed)
 [place] [facility] Provider Note   CSN: [id number] Arrival date & time: 12/08/23  1619     History  Chief Complaint  Patient presents with   Dehydration    Carlos Guzman is a 38 y.o. male with medical history to include asthma, CHF, HIV, pancreatitis, substance abuse.  Patient presents to ED for evaluation of muscle cramping, nausea, vomiting and diarrhea.  Patient reports that 3 days ago he drank an excessive amount of alcohol.  He reports the next day he developed nausea, vomiting and diarrhea.  He reports that his symptoms of progressively worsened since onset.  States that this morning he woke up with muscle cramping throughout his entire body.  Reports a history of the same related to dehydration in the past.  He denies any current nausea, vomiting.  He denies any abdominal pain, shortness of breath, chest pain.  Reports last alcoholic beverage was yesterday morning.  Denies ever being admitted to hospital for alcohol withdrawal, alcohol withdrawal seizures.  Denies dysuria or flank pain.  HPI     Home Medications Prior to Admission medications   Medication Sig Start Date End Date Taking? Authorizing Provider  amLODipine (NORVASC) 5 MG tablet Take 1 tablet (5 mg total) by mouth daily. Patient not taking: Reported on 07/18/2022 05/08/22   Al Decant, PA-C  DOVATO 50-300 MG tablet Take 1 tablet by mouth daily. 07/16/21   [provider]  metoCLOPramide (REGLAN) 10 MG tablet Take 1 tablet (10 mg total) by mouth 3 (three) times daily with meals. 01/15/23 01/15/24  Elson Areas, PA-C  promethazine (PHENERGAN) 25 MG suppository Place 1 suppository (25 mg total) rectally every 6 (six) hours as needed for nausea or vomiting. Patient not taking: Reported on 08/23/2022 08/18/22   Army Melia A, PA-C  sucralfate (CARAFATE) 1 g tablet Take 1 tablet (1 g total) by mouth 4 (four) times daily -  with meals and at bedtime. 06/10/19 06/10/19   Molpus, John, MD      Allergies    Droperidol    Review of Systems   Review of Systems  Respiratory:  Negative for shortness of breath.   Cardiovascular:  Negative for chest pain.  Gastrointestinal:  Positive for diarrhea, nausea and vomiting. Negative for abdominal pain.  All other systems reviewed and are negative.   Physical Exam Updated Vital Signs BP (!) 169/83   Pulse 75   Temp 97.8 F (36.6 C)   Resp 20   Ht 6\' 1"  (1.854 m)   Wt 83.9 kg   SpO2 99%   BMI 24.40 kg/m  Physical Exam Vitals and nursing note reviewed.  Constitutional:      General: He is not in acute distress.    Appearance: He is well-developed.  HENT:     Head: Normocephalic and atraumatic.  Eyes:     Conjunctiva/sclera: Conjunctivae normal.  Cardiovascular:     Rate and Rhythm: Normal rate and regular rhythm.     Heart sounds: No murmur heard. Pulmonary:     Effort: Pulmonary effort is normal. No respiratory distress.     Breath sounds: Normal breath sounds.  Abdominal:     Palpations: Abdomen is soft.     Tenderness: There is no abdominal tenderness.     Comments: No TTP of abdomen.  No rebound or guarding.  No overlying skin change.  Musculoskeletal:        General: No swelling.     Cervical back: Neck supple.  Skin:    General: Skin is warm and dry.     Capillary Refill: Capillary refill takes less than 2 seconds.  Neurological:     Mental Status: He is alert.  Psychiatric:        Mood and Affect: Mood normal.     ED Results / Procedures / Treatments   Labs (all labs ordered are listed, but only abnormal results are displayed) Labs Reviewed  CBC WITH DIFFERENTIAL/PLATELET - Abnormal; Notable for the following components:      Result Value   WBC 18.0 (*)    RBC 6.07 (*)    Hemoglobin 18.4 (*)    HCT 53.6 (*)    Neutro Abs 13.6 (*)    Abs Immature Granulocytes 0.11 (*)    All other components within normal limits  COMPREHENSIVE METABOLIC PANEL WITH GFR - Abnormal; Notable  for the following components:   Chloride 95 (*)    Glucose, Bld 133 (*)    BUN 33 (*)    Creatinine, Ser 2.65 (*)    Calcium 10.4 (*)    Total Protein 10.5 (*)    Albumin 5.2 (*)    AST 50 (*)    ALT 70 (*)    GFR, Estimated 30 (*)    Anion gap 17 (*)    All other components within normal limits  CK - Abnormal; Notable for the following components:   Total CK 450 (*)    All other components within normal limits  RESP PANEL BY RT-PCR (RSV, FLU A&B, COVID)  RVPGX2  LIPASE, BLOOD  MAGNESIUM  URINALYSIS, ROUTINE W REFLEX MICROSCOPIC  RAPID URINE DRUG SCREEN, HOSP PERFORMED  ETHANOL    EKG None  Radiology No results found.  Procedures Procedures   Medications Ordered in ED Medications  sodium chloride 0.9 % bolus 1,000 mL (1,000 mLs Intravenous New Bag/Given 12/08/23 1733)  thiamine (VITAMIN B1) tablet 100 mg (100 mg Oral Given 12/08/23 1738)  multivitamin with minerals tablet 1 tablet (1 tablet Oral Given 12/08/23 1738)  folic acid (FOLVITE) tablet 1 mg (1 mg Oral Given 12/08/23 1738)  cyclobenzaprine (FLEXERIL) tablet 10 mg (10 mg Oral Given 12/08/23 1738)  sodium chloride 0.9 % bolus 1,000 mL (1,000 mLs Intravenous New Bag/Given 12/08/23 1813)    ED Course/ Medical Decision Making/ A&P Clinical Course as of 12/08/23 1857  Mon Dec 08, 2023  1835 CIWA 4 [CG]    Clinical Course User Index [CG] Al Decant, PA-C   Medical Decision Making Amount and/or Complexity of Data Reviewed Labs: ordered.   38 year old male presents for evaluation.  Please see HPI for further details.  On exam patient is afebrile, nontachycardic.  Lung sounds are clear bilaterally, he is not epoxy.  His abdomen is soft and compressible with no tenderness, no overlying skin change, no rebound or guarding.  Neurological examinations at baseline.  Will assess patient for rhabdomyolysis, dehydration.   Patient symptoms initially treated with thiamine, folic acid, multivitamin, Flexeril, 1 L  of fluid.  Patient CBC with a white count 18, hemoglobin 18.4.  Patient appears to be hemoconcentrated.  CMP with chloride 95, glucose 133, BUN 33, creatinine 2.65, GFR 30.  Patient AST 50, ALT 70.  Patient creatinine 1 month ago 1.46.  His anion gap is 17.  Patient has AKI and metabolic acidosis.  Patient given 1500 mL of fluid.  Magnesium 2.4.  Lipase 27.  Patient will require admission to the hospital due to AKI.  Will order CT scan  of patient abdomen without contrast to assess for underlying causes of white count elevation to 18.  Will also collect viral panel, rapid urine drug screen, urinalysis.  At the end of my shift, patient imaging not yet complete.  Will sign patient out to [facility], PA-C.  Plan of management discussed.  Final Clinical Impression(s) / ED Diagnoses Final diagnoses:  Dehydration    Rx / DC Orders ED Discharge Orders     None         Al Decant, PA-C 12/08/23 1857

## 2023-12-08 NOTE — ED Triage Notes (Signed)
 Pt states feels dehydrated and having muscle spasms   Pt states has some N/V 3 days ago but now is tolerating po intake  Daily ETOH and states this all started after drinking heavily   H/o CHS  Last Cocaine use 2 days ago

## 2023-12-08 NOTE — ED Provider Notes (Signed)
 Signout from Jetta Lout, PA-C at shift change. Briefly, patient with a history of CHF, HIV, pancreatitis, substance abuse, asthma presents for muscle cramps with vomiting and diarrhea.  Notes that he consumed large amount of alcohol few days ago since then his symptoms have worsened.  Patient noted to have leukocytosis of 18, CK mildly elevated to 450, patient does have a AKI with creatinine of 2.65 with baseline around 1.4.  With notably elevated WBC, CT abdomen obtained which showed no obvious abnormality.     Most current vital signs reviewed and are as follows: BP (!) 169/83   Pulse 75   Temp 97.8 F (36.6 C)   Resp 20   Ht 6\' 1"  (1.854 m)   Wt 83.9 kg   SpO2 99%   BMI 24.40 kg/m   Plan was for admission for AKI, however patient states he needs to go home.  He is adamant he cannot stay.  He wants to go to [facility] in the morning where his family can visit him at the hospital.  He would rather this then be transferred via ambulance to [facility].  Reviewed risks and benefits of leaving.  Patient is understanding will be discharged AMA.    Halford Decamp, PA-C 12/08/23 2002    Tanda Rockers A, DO 12/09/23 1525

## 2023-12-08 NOTE — Discharge Instructions (Signed)
 You were evaluated in the emergency room for body aches and dehydration.  You are found to have an acute injury of your kidney.  This requires plenty of fluids.  Plan was for admission.  Please present to the hospital tomorrow for further evaluation.

## 2024-01-20 ENCOUNTER — Encounter (HOSPITAL_BASED_OUTPATIENT_CLINIC_OR_DEPARTMENT_OTHER): Payer: Self-pay

## 2024-01-20 ENCOUNTER — Other Ambulatory Visit: Payer: Self-pay

## 2024-01-20 ENCOUNTER — Emergency Department (HOSPITAL_BASED_OUTPATIENT_CLINIC_OR_DEPARTMENT_OTHER)

## 2024-01-20 ENCOUNTER — Emergency Department (HOSPITAL_BASED_OUTPATIENT_CLINIC_OR_DEPARTMENT_OTHER)
Admission: EM | Admit: 2024-01-20 | Discharge: 2024-01-20 | Disposition: A | Discharge status: Home / Self Care | Attending: Emergency Medicine | Admitting: Emergency Medicine

## 2024-01-20 DIAGNOSIS — Z21 Asymptomatic human immunodeficiency virus [HIV] infection status: Secondary | ICD-10-CM | POA: Insufficient documentation

## 2024-01-20 DIAGNOSIS — E860 Dehydration: Secondary | ICD-10-CM | POA: Diagnosis not present

## 2024-01-20 DIAGNOSIS — J45909 Unspecified asthma, uncomplicated: Secondary | ICD-10-CM | POA: Diagnosis not present

## 2024-01-20 DIAGNOSIS — R112 Nausea with vomiting, unspecified: Secondary | ICD-10-CM | POA: Diagnosis present

## 2024-01-20 DIAGNOSIS — Z5329 Procedure and treatment not carried out because of patient's decision for other reasons: Secondary | ICD-10-CM | POA: Diagnosis not present

## 2024-01-20 DIAGNOSIS — N179 Acute kidney failure, unspecified: Secondary | ICD-10-CM | POA: Diagnosis not present

## 2024-01-20 DIAGNOSIS — F12188 Cannabis abuse with other cannabis-induced disorder: Secondary | ICD-10-CM | POA: Insufficient documentation

## 2024-01-20 DIAGNOSIS — R1013 Epigastric pain: Secondary | ICD-10-CM

## 2024-01-20 DIAGNOSIS — R109 Unspecified abdominal pain: Secondary | ICD-10-CM | POA: Diagnosis not present

## 2024-01-20 LAB — CBC WITH DIFFERENTIAL/PLATELET
Abs Immature Granulocytes: 0.07 10*3/uL (ref 0.00–0.07)
Basophils Absolute: 0.1 10*3/uL (ref 0.0–0.1)
Basophils Relative: 0 %
Eosinophils Absolute: 0 10*3/uL (ref 0.0–0.5)
Eosinophils Relative: 0 %
HCT: 51.8 % (ref 39.0–52.0)
Hemoglobin: 18.8 g/dL — ABNORMAL HIGH (ref 13.0–17.0)
Immature Granulocytes: 1 %
Lymphocytes Relative: 20 %
Lymphs Abs: 3.1 10*3/uL (ref 0.7–4.0)
MCH: 31 pg (ref 26.0–34.0)
MCHC: 36.3 g/dL — ABNORMAL HIGH (ref 30.0–36.0)
MCV: 85.5 fL (ref 80.0–100.0)
Monocytes Absolute: 1.3 10*3/uL — ABNORMAL HIGH (ref 0.1–1.0)
Monocytes Relative: 9 %
Neutro Abs: 10.5 10*3/uL — ABNORMAL HIGH (ref 1.7–7.7)
Neutrophils Relative %: 70 %
Platelets: 339 10*3/uL (ref 150–400)
RBC: 6.06 MIL/uL — ABNORMAL HIGH (ref 4.22–5.81)
RDW: 13 % (ref 11.5–15.5)
WBC: 15 10*3/uL — ABNORMAL HIGH (ref 4.0–10.5)
nRBC: 0 % (ref 0.0–0.2)

## 2024-01-20 LAB — COMPREHENSIVE METABOLIC PANEL WITH GFR
ALT: 22 U/L (ref 0–44)
AST: 27 U/L (ref 15–41)
Albumin: 5.2 g/dL — ABNORMAL HIGH (ref 3.5–5.0)
Alkaline Phosphatase: 77 U/L (ref 38–126)
Anion gap: 23 — ABNORMAL HIGH (ref 5–15)
BUN: 68 mg/dL — ABNORMAL HIGH (ref 6–20)
CO2: 23 mmol/L (ref 22–32)
Calcium: 10.2 mg/dL (ref 8.9–10.3)
Chloride: 81 mmol/L — ABNORMAL LOW (ref 98–111)
Creatinine, Ser: 4.23 mg/dL — ABNORMAL HIGH (ref 0.61–1.24)
GFR, Estimated: 17 mL/min — ABNORMAL LOW (ref >60)
Glucose, Bld: 144 mg/dL — ABNORMAL HIGH (ref 70–99)
Potassium: 3.6 mmol/L (ref 3.5–5.1)
Sodium: 127 mmol/L — ABNORMAL LOW (ref 135–145)
Total Bilirubin: 1 mg/dL (ref 0.0–1.2)
Total Protein: 9.5 g/dL — ABNORMAL HIGH (ref 6.5–8.1)

## 2024-01-20 LAB — LIPASE, BLOOD: Lipase: 30 U/L (ref 11–51)

## 2024-01-20 MED ORDER — DIPHENHYDRAMINE HCL 50 MG/ML IJ SOLN
12.5000 mg | Freq: Once | INTRAMUSCULAR | Status: AC
  Administered 2024-01-20: 12.5 mg via INTRAVENOUS
  Filled 2024-01-20: qty 1

## 2024-01-20 MED ORDER — DROPERIDOL 2.5 MG/ML IJ SOLN
1.2500 mg | Freq: Once | INTRAMUSCULAR | Status: AC
  Administered 2024-01-20: 1.25 mg via INTRAVENOUS
  Filled 2024-01-20: qty 2

## 2024-01-20 MED ORDER — LACTATED RINGERS IV BOLUS
1000.0000 mL | Freq: Once | INTRAVENOUS | Status: AC
  Administered 2024-01-20: 1000 mL via INTRAVENOUS

## 2024-01-20 MED ORDER — FAMOTIDINE IN NACL 20-0.9 MG/50ML-% IV SOLN
20.0000 mg | Freq: Once | INTRAVENOUS | Status: AC
  Administered 2024-01-20: 20 mg via INTRAVENOUS
  Filled 2024-01-20: qty 50

## 2024-01-20 NOTE — ED Triage Notes (Signed)
 Pt states that he has been vomiting x 3 days. States he is also having abdominal pain. Pt states he has been smoking marijuana and some alcohol in the last week.

## 2024-01-20 NOTE — Discharge Instructions (Addendum)
 You are encouraged to be admitted to the hospital for rehydration and continued trending of your renal function to ensure improvement as you have a significant AKI with a creatinine over 4.  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  Your CT abdomen pelvis revealed the following: IMPRESSION:  *Moderate amount of residual fecal material throughout the colon  without obstruction or constipation.  *There is suggestive of very subtle mucosal thickening involving the  cecum or ascending colon that could correlate with colitis. No  definitive evidence of appendicitis.   It was recommended that you be admitted for observation and continued rehydration however you have elected to sign out AGAINST MEDICAL ADVICE understanding the risks that this entails to include significant morbidity and mortality which include worsening renal function, need for potential hemodialysis, dehydration and cardiovascular collapse.  Despite these risks you have elected to sign out AGAINST MEDICAL ADVICE.

## 2024-01-20 NOTE — ED Notes (Signed)
 ED Provider at bedside.

## 2024-01-20 NOTE — ED Provider Notes (Signed)
 [place] [facility]MENT AT [facility] Provider Note   CSN: [id number] Arrival date & time: 01/20/24  0709     History  Chief Complaint  Patient presents with   Abdominal Pain    Carlos Guzman is a 38 y.o. male.   Abdominal Pain Associated symptoms: nausea and vomiting      38 year old male with medical history significant for asthma, polysubstance abuse, cannabinoid hyperemesis syndrome, pancreatitis, HIV last CD4 count 1304 in July 2023 who reports compliance with his HIV medications who presents to the emergency department with uncontrolled nausea and vomiting over the past week.  The patient states that "I have an alcohol problem" and states that his last drink was last weekend over a week ago.  He has since developed epigastric abdominal pain radiating to the back with associated nausea and vomiting.  He last smoked marijuana 4 days ago.  He has a history of cannabis hyperemesis syndrome.  He denies issues with alcohol withdrawal syndrome.  He has been having difficulty tolerating oral intake.  He currently rates his pain as 5 out of 10 located in the epigastrium with slight radiation to the back.  He is unsure of his last bowel movement however he is passing gas and has had decreased oral intake over the past week.  Home Medications Prior to Admission medications   Medication Sig Start Date End Date Taking? Authorizing Provider  amLODipine  (NORVASC ) 5 MG tablet Take 1 tablet (5 mg total) by mouth daily. Patient not taking: Reported on 07/18/2022 05/08/22   Adel Aden, PA-C  DOVATO  50-300 MG tablet Take 1 tablet by mouth daily. 07/16/21   [provider]  metoCLOPramide  (REGLAN ) 10 MG tablet Take 1 tablet (10 mg total) by mouth 3 (three) times daily with meals. 01/15/23 01/15/24  Sofia, Leslie K, PA-C  ondansetron  (ZOFRAN ) 4 MG tablet Take 1 tablet (4 mg total) by mouth every 6 (six) hours. 12/08/23   Felicie Horning, PA-C  promethazine   (PHENERGAN ) 25 MG suppository Place 1 suppository (25 mg total) rectally every 6 (six) hours as needed for nausea or vomiting. Patient not taking: Reported on 08/23/2022 08/18/22   Editha Goring A, PA-C  sucralfate  (CARAFATE ) 1 g tablet Take 1 tablet (1 g total) by mouth 4 (four) times daily -  with meals and at bedtime. 06/10/19 06/10/19  Molpus, John, MD      Allergies    Droperidol     Review of Systems   Review of Systems  Gastrointestinal:  Positive for abdominal pain, nausea and vomiting.  All other systems reviewed and are negative.   Physical Exam Updated Vital Signs BP (!) 144/119   Pulse (!) 105   Temp 98.2 F (36.8 C) (Oral)   Resp 18   Ht 6\' 1"  (1.854 m)   Wt 79.4 kg   SpO2 100%   BMI 23.09 kg/m  Physical Exam Vitals and nursing note reviewed.  Constitutional:      General: He is not in acute distress.    Appearance: He is well-developed.  HENT:     Head: Normocephalic and atraumatic.  Eyes:     Conjunctiva/sclera: Conjunctivae normal.  Cardiovascular:     Rate and Rhythm: Regular rhythm. Tachycardia present.  Pulmonary:     Effort: Pulmonary effort is normal. No respiratory distress.     Breath sounds: Normal breath sounds.  Abdominal:     Palpations: Abdomen is soft.     Tenderness: There is abdominal tenderness in the epigastric  area. There is no guarding or rebound.     Comments: Mild epigastric tenderness to palpation, no rebound or guarding  Musculoskeletal:        General: No swelling.     Cervical back: Neck supple.  Skin:    General: Skin is warm and dry.     Capillary Refill: Capillary refill takes less than 2 seconds.  Neurological:     Mental Status: He is alert.  Psychiatric:        Mood and Affect: Mood normal.     ED Results / Procedures / Treatments   Labs (all labs ordered are listed, but only abnormal results are displayed) Labs Reviewed  CBC WITH DIFFERENTIAL/PLATELET - Abnormal; Notable for the following components:       Result Value   WBC 15.0 (*)    RBC 6.06 (*)    Hemoglobin 18.8 (*)    MCHC 36.3 (*)    Neutro Abs 10.5 (*)    Monocytes Absolute 1.3 (*)    All other components within normal limits  COMPREHENSIVE METABOLIC PANEL WITH GFR - Abnormal; Notable for the following components:   Sodium 127 (*)    Chloride 81 (*)    Glucose, Bld 144 (*)    BUN 68 (*)    Creatinine, Ser 4.23 (*)    Total Protein 9.5 (*)    Albumin 5.2 (*)    GFR, Estimated 17 (*)    Anion gap 23 (*)    All other components within normal limits  LIPASE, BLOOD  URINE DRUG SCREEN    EKG EKG Interpretation Date/Time:  Tuesday Jan 20 2024 07:29:22 EDT Ventricular Rate:  102 PR Interval:  133 QRS Duration:  132 QT Interval:  362 QTC Calculation: 472 R Axis:   84  Text Interpretation: Sinus tachycardia Right atrial enlargement Probable left ventricular hypertrophy ST elev, probable normal early repol pattern QTc normal Confirmed by Rosealee Concha ([id number]) on 01/20/2024 7:31:09 AM  Radiology CT ABDOMEN PELVIS WO CONTRAST Result Date: 01/20/2024 CLINICAL DATA:  Abdominal pain acute nonlocalized. EXAM: CT ABDOMEN AND PELVIS WITHOUT CONTRAST TECHNIQUE: Multidetector CT imaging of the abdomen and pelvis was performed following the standard protocol without IV contrast. RADIATION DOSE REDUCTION: This exam was performed according to the departmental dose-optimization program which includes automated exposure control, adjustment of the mA and/or kV according to patient size and/or use of iterative reconstruction technique. COMPARISON:  Feb 07, 2024 FINDINGS: Lower chest: No infiltrates or consolidations, no pleural effusions Hepatobiliary: Liver normal size no masses no biliary dilatation. Gallbladder unremarkable. No gallstones. Pancreas: Pancreas normal size. No masses calcifications or inflammatory changes. Spleen: Spleen normal size.  No masses. Adrenals/Urinary Tract: Adrenal glands are normal size. Follow-up recommended. Kidneys are  normal. No masses calcifications or hydronephrosis Stomach/Bowel: No small or large bowel obstruction or inflammatory changes. Moderate amount of residual fecal material throughout the colon without obstruction or constipation. There is suggestive of very subtle mucosal thickening involving the cecum or ascending colon that could correlate with colitis. No definitive evidence of appendicitis. Vascular/Lymphatic: No significant vascular findings are present. No enlarged abdominal or pelvic lymph nodes. Reproductive: .  No masses. Bladder unremarkable. Other: Anterior abdominal wall unremarkable without evidence of umbilical or inguinal hernias Musculoskeletal: Visualized portion of the thoracolumbar spine and pelvic structures grossly unremarkable without evidence of fracture bony abnormalities or soft tissue masses. IMPRESSION: *Moderate amount of residual fecal material throughout the colon without obstruction or constipation. *There is suggestive of very subtle mucosal thickening involving  the cecum or ascending colon that could correlate with colitis. No definitive evidence of appendicitis. Electronically Signed   By: Fredrich Jefferson M.D.   On: 01/20/2024 08:48    Procedures Procedures    Medications Ordered in ED Medications  lactated ringers  bolus 1,000 mL (0 mLs Intravenous Stopped 01/20/24 0900)  famotidine  (PEPCID ) IVPB 20 mg premix (0 mg Intravenous Stopped 01/20/24 0820)  droperidol  (INAPSINE ) 2.5 MG/ML injection 1.25 mg (1.25 mg Intravenous Given 01/20/24 0743)  diphenhydrAMINE  (BENADRYL ) injection 12.5 mg (12.5 mg Intravenous Given 01/20/24 0742)  lactated ringers  bolus 1,000 mL (1,000 mLs Intravenous New Bag/Given 01/20/24 0900)    ED Course/ Medical Decision Making/ A&P Clinical Course as of 01/20/24 0914  Tue Jan 20, 2024  0830 Creatinine(!): 4.23 [JL]  0830 BUN(!): 68 [JL]  0830 WBC(!): 15.0 [JL]  0830 Hemoglobin(!): 18.8 Likely hemoconcentration from significant dehydration [JL]     Clinical Course User Index [JL] Rosealee Concha, MD                                 Medical Decision Making Amount and/or Complexity of Data Reviewed Labs: ordered. Decision-making details documented in ED Course. Radiology: ordered.  Risk Prescription drug management.    38 year old male with medical history significant for asthma, polysubstance abuse, cannabinoid hyperemesis syndrome, pancreatitis, HIV last CD4 count 1304 in July 2023 who reports compliance with his HIV medications who presents to the emergency department with uncontrolled nausea and vomiting over the past week.  The patient states that "I have an alcohol problem" and states that his last drink was last weekend over a week ago.  He has since developed epigastric abdominal pain radiating to the back with associated nausea and vomiting.  He last smoked marijuana 4 days ago.  He has a history of cannabis hyperemesis syndrome.  He denies issues with alcohol withdrawal syndrome.  He has been having difficulty tolerating oral intake.  He currently rates his pain as 5 out of 10 located in the epigastrium with slight radiation to the back.  He is unsure of his last bowel movement however he is passing gas and has had decreased oral intake over the past week.   On arrival, the patient was afebrile, tachycardic heart rate 114, RR 18, BP 155/112, saturating 100% on room air.  Sinus tachycardia noted on cardiac telemetry.  Physical exam revealed an overall well-appearing male with tachycardia noted on exam, mild epigastric tenderness to palpation, no rebound or guarding.  Differential diagnosis includes cannabis hyperemesis syndrome, alcohol induced pancreatitis, gallstone disease, less likely small bowel obstruction, gastritis, other acute intra-abdominal abnormality.  The patient has an allergy listed to droperidol , reported side effect of EPS, recommendations included premedicating with Benadryl  prior to administration.  Also has  reported history of prolonged Qtc on EKG.  EKG was obtained and revealed sinus tachycardia, ventricular rate 102, likely early repolarization in present, probable LVH, QTc normal at 472.   Labs: Laboratory evaluation revealed hemoconcentration on CBC with a leukocytosis to 15, hemoglobin of 18.8 and lipase normal, CMP with a significant AKI with a serum creatinine of 4.23 from a baseline of between 1.4-2, elevated BUN at 68, hyponatremia to 127, hypochloremia at 81, LFTs normal.  Patient fluid resuscitated with 2 L of LR, administered IV Pepcid , IV droperidol  and IV Benadryl , following which was feeling significantly symptomatically improved.    CT Abd Pelvis:  IMPRESSION:  *Moderate amount of residual fecal material  throughout the colon  without obstruction or constipation.  *There is suggestive of very subtle mucosal thickening involving the  cecum or ascending colon that could correlate with colitis. No  definitive evidence of appendicitis.    Given the patient's dehydration and significant AKI, I did recommend admission for further rehydration and recheck of his renal function.  The patient declined admission, preferred to sign out AGAINST MEDICAL ADVICE.  I explained the risk of signing out events medical vice which includes significant morbidity mentality which could include worsening renal function desiccating the need for eventual hemodialysis, dehydration and cardiovascular collapse, continued endorgan dysfunction, worsening renal function.  Patient plans to follow-up outpatient for recheck of his renal function, will continue to orally rehydrate.  Will discharge on Zofran , advised cannabis cessation, return precautions provided, patient understands the risks of signing out AGAINST MEDICAL ADVICE and still elected to sign out AMA.  Final Clinical Impression(s) / ED Diagnoses Final diagnoses:  Epigastric pain  Cannabis hyperemesis syndrome concurrent with and due to cannabis abuse (HCC)   Dehydration  AKI (acute kidney injury) [facility])    Rx / DC Orders ED Discharge Orders     None         Rosealee Concha, MD 01/20/24 [phone]

## 2024-01-20 NOTE — ED Notes (Signed)
 Patient transported to CT

## 2024-04-18 ENCOUNTER — Other Ambulatory Visit: Payer: Self-pay

## 2024-04-18 ENCOUNTER — Encounter (HOSPITAL_BASED_OUTPATIENT_CLINIC_OR_DEPARTMENT_OTHER): Payer: Self-pay | Admitting: Emergency Medicine

## 2024-04-18 ENCOUNTER — Emergency Department (HOSPITAL_BASED_OUTPATIENT_CLINIC_OR_DEPARTMENT_OTHER)

## 2024-04-18 ENCOUNTER — Inpatient Hospital Stay (HOSPITAL_BASED_OUTPATIENT_CLINIC_OR_DEPARTMENT_OTHER)
Admission: EM | Admit: 2024-04-18 | Discharge: 2024-04-20 | DRG: 682 | Disposition: A | Discharge status: Home / Self Care | Attending: Internal Medicine | Admitting: Internal Medicine

## 2024-04-18 DIAGNOSIS — M6282 Rhabdomyolysis: Secondary | ICD-10-CM | POA: Diagnosis present

## 2024-04-18 DIAGNOSIS — F1210 Cannabis abuse, uncomplicated: Secondary | ICD-10-CM

## 2024-04-18 DIAGNOSIS — F17210 Nicotine dependence, cigarettes, uncomplicated: Secondary | ICD-10-CM | POA: Diagnosis present

## 2024-04-18 DIAGNOSIS — D72829 Elevated white blood cell count, unspecified: Secondary | ICD-10-CM | POA: Diagnosis present

## 2024-04-18 DIAGNOSIS — I2489 Other forms of acute ischemic heart disease: Secondary | ICD-10-CM | POA: Diagnosis present

## 2024-04-18 DIAGNOSIS — F1910 Other psychoactive substance abuse, uncomplicated: Secondary | ICD-10-CM | POA: Diagnosis present

## 2024-04-18 DIAGNOSIS — Z79899 Other long term (current) drug therapy: Secondary | ICD-10-CM

## 2024-04-18 DIAGNOSIS — J45909 Unspecified asthma, uncomplicated: Secondary | ICD-10-CM | POA: Diagnosis present

## 2024-04-18 DIAGNOSIS — Y900 Blood alcohol level of less than 20 mg/100 ml: Secondary | ICD-10-CM | POA: Diagnosis present

## 2024-04-18 DIAGNOSIS — R6511 Systemic inflammatory response syndrome (SIRS) of non-infectious origin with acute organ dysfunction: Secondary | ICD-10-CM | POA: Diagnosis present

## 2024-04-18 DIAGNOSIS — E872 Acidosis, unspecified: Principal | ICD-10-CM | POA: Diagnosis present

## 2024-04-18 DIAGNOSIS — E860 Dehydration: Secondary | ICD-10-CM | POA: Diagnosis present

## 2024-04-18 DIAGNOSIS — R000 Tachycardia, unspecified: Secondary | ICD-10-CM | POA: Diagnosis present

## 2024-04-18 DIAGNOSIS — Z21 Asymptomatic human immunodeficiency virus [HIV] infection status: Secondary | ICD-10-CM | POA: Diagnosis present

## 2024-04-18 DIAGNOSIS — F1010 Alcohol abuse, uncomplicated: Secondary | ICD-10-CM | POA: Diagnosis present

## 2024-04-18 DIAGNOSIS — R1010 Upper abdominal pain, unspecified: Secondary | ICD-10-CM | POA: Diagnosis present

## 2024-04-18 DIAGNOSIS — E876 Hypokalemia: Secondary | ICD-10-CM | POA: Diagnosis present

## 2024-04-18 DIAGNOSIS — R112 Nausea with vomiting, unspecified: Secondary | ICD-10-CM | POA: Diagnosis present

## 2024-04-18 DIAGNOSIS — N179 Acute kidney failure, unspecified: Secondary | ICD-10-CM | POA: Diagnosis present

## 2024-04-18 DIAGNOSIS — R739 Hyperglycemia, unspecified: Secondary | ICD-10-CM | POA: Diagnosis present

## 2024-04-18 DIAGNOSIS — R0682 Tachypnea, not elsewhere classified: Secondary | ICD-10-CM | POA: Diagnosis present

## 2024-04-18 DIAGNOSIS — R6510 Systemic inflammatory response syndrome (SIRS) of non-infectious origin without acute organ dysfunction: Secondary | ICD-10-CM | POA: Diagnosis present

## 2024-04-18 LAB — I-STAT VENOUS BLOOD GAS, ED
Acid-Base Excess: 0 mmol/L (ref 0.0–2.0)
Bicarbonate: 24.8 mmol/L (ref 20.0–28.0)
Calcium, Ion: 0.83 mmol/L — CL (ref 1.15–1.40)
HCT: 61 % — ABNORMAL HIGH (ref 39.0–52.0)
Hemoglobin: 20.7 g/dL — ABNORMAL HIGH (ref 13.0–17.0)
O2 Saturation: 59 %
Potassium: 3.4 mmol/L — ABNORMAL LOW (ref 3.5–5.1)
Sodium: 129 mmol/L — ABNORMAL LOW (ref 135–145)
TCO2: 26 mmol/L (ref 22–32)
pCO2, Ven: 40 mmHg — ABNORMAL LOW (ref 44–60)
pH, Ven: 7.401 (ref 7.25–7.43)
pO2, Ven: 31 mmHg — CL (ref 32–45)

## 2024-04-18 LAB — URINALYSIS, ROUTINE W REFLEX MICROSCOPIC
Glucose, UA: NEGATIVE mg/dL
Ketones, ur: NEGATIVE mg/dL
Leukocytes,Ua: NEGATIVE
Nitrite: NEGATIVE
Protein, ur: 100 mg/dL — AB
Specific Gravity, Urine: 1.025 (ref 1.005–1.030)
pH: 5.5 (ref 5.0–8.0)

## 2024-04-18 LAB — COMPREHENSIVE METABOLIC PANEL WITH GFR
ALT: 42 U/L (ref 0–44)
AST: 56 U/L — ABNORMAL HIGH (ref 15–41)
Albumin: 5.8 g/dL — ABNORMAL HIGH (ref 3.5–5.0)
Alkaline Phosphatase: 87 U/L (ref 38–126)
Anion gap: 36 — ABNORMAL HIGH (ref 5–15)
BUN: 61 mg/dL — ABNORMAL HIGH (ref 6–20)
CO2: 22 mmol/L (ref 22–32)
Calcium: 10.4 mg/dL — ABNORMAL HIGH (ref 8.9–10.3)
Chloride: 74 mmol/L — ABNORMAL LOW (ref 98–111)
Creatinine, Ser: 8.44 mg/dL — ABNORMAL HIGH (ref 0.61–1.24)
GFR, Estimated: 8 mL/min — ABNORMAL LOW (ref >60)
Glucose, Bld: 210 mg/dL — ABNORMAL HIGH (ref 70–99)
Potassium: 3.9 mmol/L (ref 3.5–5.1)
Sodium: 132 mmol/L — ABNORMAL LOW (ref 135–145)
Total Bilirubin: 1 mg/dL (ref 0.0–1.2)
Total Protein: 10.8 g/dL — ABNORMAL HIGH (ref 6.5–8.1)

## 2024-04-18 LAB — CBC WITH DIFFERENTIAL/PLATELET
Abs Immature Granulocytes: 0.52 K/uL — ABNORMAL HIGH (ref 0.00–0.07)
Basophils Absolute: 0 K/uL (ref 0.0–0.1)
Basophils Relative: 0 %
Eosinophils Absolute: 0 K/uL (ref 0.0–0.5)
Eosinophils Relative: 0 %
HCT: 59.7 % — ABNORMAL HIGH (ref 39.0–52.0)
Hemoglobin: 21.5 g/dL (ref 13.0–17.0)
Immature Granulocytes: 2 %
Lymphocytes Relative: 5 %
Lymphs Abs: 1.8 K/uL (ref 0.7–4.0)
MCH: 31.6 pg (ref 26.0–34.0)
MCHC: 36 g/dL (ref 30.0–36.0)
MCV: 87.8 fL (ref 80.0–100.0)
Monocytes Absolute: 1.7 K/uL — ABNORMAL HIGH (ref 0.1–1.0)
Monocytes Relative: 5 %
Neutro Abs: 30.5 K/uL — ABNORMAL HIGH (ref 1.7–7.7)
Neutrophils Relative %: 88 %
Platelets: 373 K/uL (ref 150–400)
RBC: 6.8 MIL/uL — ABNORMAL HIGH (ref 4.22–5.81)
RDW: 12.2 % (ref 11.5–15.5)
Smear Review: NORMAL
WBC: 34.6 K/uL — ABNORMAL HIGH (ref 4.0–10.5)
nRBC: 0 % (ref 0.0–0.2)

## 2024-04-18 LAB — BASIC METABOLIC PANEL WITH GFR
Anion gap: 17 — ABNORMAL HIGH (ref 5–15)
BUN: 58 mg/dL — ABNORMAL HIGH (ref 6–20)
CO2: 28 mmol/L (ref 22–32)
Calcium: 8.7 mg/dL — ABNORMAL LOW (ref 8.9–10.3)
Chloride: 88 mmol/L — ABNORMAL LOW (ref 98–111)
Creatinine, Ser: 4.42 mg/dL — ABNORMAL HIGH (ref 0.61–1.24)
GFR, Estimated: 17 mL/min — ABNORMAL LOW (ref >60)
Glucose, Bld: 149 mg/dL — ABNORMAL HIGH (ref 70–99)
Potassium: 3.3 mmol/L — ABNORMAL LOW (ref 3.5–5.1)
Sodium: 133 mmol/L — ABNORMAL LOW (ref 135–145)

## 2024-04-18 LAB — CBC
HCT: 47.2 % (ref 39.0–52.0)
Hemoglobin: 16.9 g/dL (ref 13.0–17.0)
MCH: 31.3 pg (ref 26.0–34.0)
MCHC: 35.8 g/dL (ref 30.0–36.0)
MCV: 87.4 fL (ref 80.0–100.0)
Platelets: 295 K/uL (ref 150–400)
RBC: 5.4 MIL/uL (ref 4.22–5.81)
RDW: 12 % (ref 11.5–15.5)
WBC: 25.4 K/uL — ABNORMAL HIGH (ref 4.0–10.5)
nRBC: 0 % (ref 0.0–0.2)

## 2024-04-18 LAB — URINALYSIS, MICROSCOPIC (REFLEX)

## 2024-04-18 LAB — LACTIC ACID, PLASMA
Lactic Acid, Venous: 5.4 mmol/L (ref 0.5–1.9)
Lactic Acid, Venous: 3.7 mmol/L (ref 0.5–1.9)
Lactic Acid, Venous: 1.7 mmol/L (ref 0.5–1.9)

## 2024-04-18 LAB — CK: Total CK: 2948 U/L — ABNORMAL HIGH (ref 49–397)

## 2024-04-18 LAB — TROPONIN T, HIGH SENSITIVITY
Troponin T High Sensitivity: 60 ng/L — ABNORMAL HIGH (ref <19)
Troponin T High Sensitivity: 44 ng/L — ABNORMAL HIGH (ref <19)

## 2024-04-18 LAB — GLUCOSE, CAPILLARY
Glucose-Capillary: 151 mg/dL — ABNORMAL HIGH (ref 70–99)
Glucose-Capillary: 151 mg/dL — ABNORMAL HIGH (ref 70–99)
Glucose-Capillary: 176 mg/dL — ABNORMAL HIGH (ref 70–99)

## 2024-04-18 LAB — BETA-HYDROXYBUTYRIC ACID: Beta-Hydroxybutyric Acid: 0.25 mmol/L (ref 0.05–0.27)

## 2024-04-18 LAB — ETHANOL: Alcohol, Ethyl (B): <15 mg/dL (ref <15)

## 2024-04-18 LAB — MAGNESIUM: Magnesium: 1.7 mg/dL (ref 1.7–2.4)

## 2024-04-18 LAB — LIPASE, BLOOD: Lipase: 35 U/L (ref 11–51)

## 2024-04-18 MED ORDER — LORAZEPAM 2 MG/ML IJ SOLN: 0.0000 mg | INTRAMUSCULAR | Status: DC

## 2024-04-18 MED ORDER — HYDRALAZINE HCL 20 MG/ML IJ SOLN
5.0000 mg | INTRAMUSCULAR | Status: DC | PRN
  Administered 2024-04-19 (×2): 5 mg via INTRAVENOUS
  Filled 2024-04-18: qty 1

## 2024-04-18 MED ORDER — PROMETHAZINE HCL 25 MG/ML IJ SOLN
INTRAMUSCULAR | Status: AC
  Filled 2024-04-18: qty 1

## 2024-04-18 MED ORDER — ONDANSETRON HCL 4 MG/2ML IJ SOLN
4.0000 mg | Freq: Once | INTRAMUSCULAR | Status: AC
  Administered 2024-04-18: 4 mg via INTRAVENOUS
  Filled 2024-04-18: qty 2

## 2024-04-18 MED ORDER — LORAZEPAM 2 MG/ML IJ SOLN: 0.0000 mg | Freq: Three times a day (TID) | INTRAMUSCULAR | Status: DC

## 2024-04-18 MED ORDER — LACTATED RINGERS IV BOLUS
1000.0000 mL | Freq: Once | INTRAVENOUS | Status: AC
  Administered 2024-04-18: 1000 mL via INTRAVENOUS

## 2024-04-18 MED ORDER — METRONIDAZOLE 500 MG/100ML IV SOLN
500.0000 mg | Freq: Once | INTRAVENOUS | Status: AC
  Administered 2024-04-18: 500 mg via INTRAVENOUS
  Filled 2024-04-18: qty 100

## 2024-04-18 MED ORDER — SODIUM CHLORIDE 0.9 % IV BOLUS
1000.0000 mL | Freq: Once | INTRAVENOUS | Status: AC
  Administered 2024-04-18: 1000 mL via INTRAVENOUS

## 2024-04-18 MED ORDER — ENOXAPARIN SODIUM 40 MG/0.4ML IJ SOSY: 40.0000 mg | PREFILLED_SYRINGE | INTRAMUSCULAR | Status: DC

## 2024-04-18 MED ORDER — HYDROMORPHONE HCL 1 MG/ML IJ SOLN
0.5000 mg | INTRAMUSCULAR | Status: DC | PRN
  Administered 2024-04-18 – 2024-04-19 (×8): 0.5 mg via INTRAVENOUS
  Filled 2024-04-18 (×5): qty 0.5

## 2024-04-18 MED ORDER — LACTATED RINGERS IV SOLN: INTRAVENOUS | Status: AC

## 2024-04-18 MED ORDER — LORAZEPAM 1 MG PO TABS
1.0000 mg | ORAL_TABLET | ORAL | Status: DC | PRN
  Administered 2024-04-19 (×4): 1 mg via ORAL
  Filled 2024-04-18 (×2): qty 1

## 2024-04-18 MED ORDER — HEPARIN SODIUM (PORCINE) 5000 UNIT/ML IJ SOLN
5000.0000 [IU] | Freq: Three times a day (TID) | INTRAMUSCULAR | Status: DC
  Administered 2024-04-18 – 2024-04-20 (×10): 5000 [IU] via SUBCUTANEOUS
  Filled 2024-04-18 (×6): qty 1

## 2024-04-18 MED ORDER — MORPHINE SULFATE (PF) 4 MG/ML IV SOLN
4.0000 mg | Freq: Once | INTRAVENOUS | Status: AC
  Administered 2024-04-18: 4 mg via INTRAVENOUS
  Filled 2024-04-18: qty 1

## 2024-04-18 MED ORDER — ADULT MULTIVITAMIN W/MINERALS CH
1.0000 | ORAL_TABLET | Freq: Every day | ORAL | Status: DC
  Administered 2024-04-18 – 2024-04-20 (×5): 1 via ORAL
  Filled 2024-04-18 (×3): qty 1

## 2024-04-18 MED ORDER — SODIUM CHLORIDE 0.9 % IV SOLN
12.5000 mg | Freq: Once | INTRAVENOUS | Status: AC
  Administered 2024-04-18: 12.5 mg via INTRAVENOUS
  Filled 2024-04-18: qty 0.5

## 2024-04-18 MED ORDER — THIAMINE MONONITRATE 100 MG PO TABS
100.0000 mg | ORAL_TABLET | Freq: Every day | ORAL | Status: DC
  Administered 2024-04-18 – 2024-04-20 (×5): 100 mg via ORAL
  Filled 2024-04-18 (×3): qty 1

## 2024-04-18 MED ORDER — FOLIC ACID 1 MG PO TABS
1.0000 mg | ORAL_TABLET | Freq: Every day | ORAL | Status: DC
  Administered 2024-04-18 – 2024-04-20 (×5): 1 mg via ORAL
  Filled 2024-04-18 (×3): qty 1

## 2024-04-18 MED ORDER — LACTATED RINGERS IV BOLUS
500.0000 mL | Freq: Once | INTRAVENOUS | Status: AC
  Administered 2024-04-18: 500 mL via INTRAVENOUS

## 2024-04-18 MED ORDER — PROCHLORPERAZINE EDISYLATE 10 MG/2ML IJ SOLN
10.0000 mg | Freq: Four times a day (QID) | INTRAMUSCULAR | Status: DC | PRN
  Administered 2024-04-19 (×2): 10 mg via INTRAVENOUS
  Filled 2024-04-18: qty 2

## 2024-04-18 MED ORDER — INSULIN ASPART 100 UNIT/ML IJ SOLN
0.0000 [IU] | INTRAMUSCULAR | Status: DC
  Administered 2024-04-18 (×2): 2 [IU] via SUBCUTANEOUS
  Administered 2024-04-19 (×2): 1 [IU] via SUBCUTANEOUS

## 2024-04-18 MED ORDER — SODIUM CHLORIDE 0.9 % IV SOLN
2.0000 g | Freq: Once | INTRAVENOUS | Status: AC
  Administered 2024-04-18: 2 g via INTRAVENOUS
  Filled 2024-04-18: qty 12.5

## 2024-04-18 MED ORDER — VANCOMYCIN HCL IN DEXTROSE 1-5 GM/200ML-% IV SOLN
1000.0000 mg | Freq: Once | INTRAVENOUS | Status: AC
  Administered 2024-04-18: 1000 mg via INTRAVENOUS
  Filled 2024-04-18: qty 200

## 2024-04-18 MED ORDER — THIAMINE HCL 100 MG/ML IJ SOLN: 100.0000 mg | Freq: Every day | INTRAMUSCULAR | Status: DC

## 2024-04-18 MED ORDER — LORAZEPAM 2 MG/ML IJ SOLN: 1.0000 mg | INTRAMUSCULAR | Status: DC | PRN

## 2024-04-18 NOTE — Sepsis Progress Note (Signed)
 Sepsis protocol is being followed by [facility].

## 2024-04-18 NOTE — ED Notes (Signed)
 Override due to self-mix.

## 2024-04-18 NOTE — ED Notes (Signed)
 Pt refused to use urinal in room. Pt educated about risk of falls. Pt expressed understanding of information. Pt given urine cup and unhooked to use restroom

## 2024-04-18 NOTE — ED Notes (Signed)
 Pt was given an apple juice without issue. Pt then given a sprite and threw it up due to the carbonation.

## 2024-04-18 NOTE — Plan of Care (Signed)

## 2024-04-18 NOTE — ED Notes (Signed)
 Patient transported to CT on cardiac monitor with [facility]

## 2024-04-18 NOTE — Plan of Care (Signed)
 Hospital Medicine Transfer Accept Note Patient Name/Age: Carlos Guzman / 38 y.o. MRN: [id number] Admission Date: 04/18/2024  Once successfully transferred to the appropriate floor, TRH will assume care for the patient above.  A/P: 89M h/o HIV, alcohol abuse, cannabis hyperemesis syndrome p/w ARF and alcohol withdrawal. Labs c/f severe volume depletion vs sepsis given elev WBC 34, lactic acid 5.4. Will need aggressive IVF, renal consult for HD, and  CIWA on arrival.  Past Medical History:  Diagnosis Date   Asthma    Cannabinoid hyperemesis syndrome    HIV (human immunodeficiency virus infection) (HCC)    Pancreatitis    Substance abuse (HCC)

## 2024-04-18 NOTE — ED Triage Notes (Signed)
 Epigastric pain and n/v since Wed. States has been drinking ETOH more than usual due to stress of having his daughter murdered last month. Appears ill

## 2024-04-18 NOTE — ED Provider Notes (Signed)
 [facility] Provider Note   CSN: [id number] Arrival date & time: 04/18/24  9266     Patient presents with: Abdominal Pain (N/V)   Carlos Guzman is a 38 y.o. male.   Patient is a 38 year old male with a history of alcohol abuse, cannabis hyperemesis syndrome, asthma, HIV.  He reports with upper abdominal pain and vomiting.  He has had similar symptoms in the past.  He denies any fevers.  No upper respiratory symptoms.  No hematemesis or blood in his stool.  No diarrhea.  He says the pain is similar to his prior episodes.  He says he has been drinking more heavily recently because his daughter got murdered on July 2.  He had a 3-day history of nausea vomiting with the upper abdominal pain.  He says he was previously just drinking regular liquor that he would buy in the store.  He denies any homemade moonshine type products or other things such as antifreeze.       Prior to Admission medications   Medication Sig Start Date End Date Taking? Authorizing Provider  amLODipine  (NORVASC ) 5 MG tablet Take 1 tablet (5 mg total) by mouth daily. Patient not taking: Reported on 07/18/2022 05/08/22   Ruthell Lonni FALCON, PA-C  DOVATO  50-300 MG tablet Take 1 tablet by mouth daily. 07/16/21   [provider]  metoCLOPramide  (REGLAN ) 10 MG tablet Take 1 tablet (10 mg total) by mouth 3 (three) times daily with meals. 01/15/23 01/15/24  Sofia, Leslie K, PA-C  ondansetron  (ZOFRAN ) 4 MG tablet Take 1 tablet (4 mg total) by mouth every 6 (six) hours. 12/08/23   Donnajean Lynwood DEL, PA-C  promethazine  (PHENERGAN ) 25 MG suppository Place 1 suppository (25 mg total) rectally every 6 (six) hours as needed for nausea or vomiting. Patient not taking: Reported on 08/23/2022 08/18/22   Beverley Doffing A, PA-C  sucralfate  (CARAFATE ) 1 g tablet Take 1 tablet (1 g total) by mouth 4 (four) times daily -  with meals and at bedtime. 06/10/19 06/10/19  Molpus, John, MD     Allergies: Droperidol     Review of Systems  Constitutional:  Positive for fatigue. Negative for chills, diaphoresis and fever.  HENT:  Negative for congestion, rhinorrhea and sneezing.   Eyes: Negative.   Respiratory:  Negative for cough, chest tightness and shortness of breath.   Cardiovascular:  Negative for chest pain and leg swelling.  Gastrointestinal:  Positive for abdominal pain, nausea and vomiting. Negative for blood in stool and diarrhea.  Genitourinary:  Negative for difficulty urinating, flank pain, frequency and hematuria.  Musculoskeletal:  Negative for arthralgias and back pain.  Skin:  Negative for rash.  Neurological:  Negative for dizziness, speech difficulty, weakness, numbness and headaches.    Updated Vital Signs BP (!) 171/113   Pulse (!) 101   Temp 97.9 F (36.6 C) (Oral)   Resp 13   Wt 78.5 kg   SpO2 100%   BMI 22.82 kg/m   Physical Exam Constitutional:      Appearance: He is well-developed.  HENT:     Head: Normocephalic and atraumatic.  Eyes:     Pupils: Pupils are equal, round, and reactive to light.  Cardiovascular:     Rate and Rhythm: Normal rate and regular rhythm.     Heart sounds: Normal heart sounds.  Pulmonary:     Effort: Pulmonary effort is normal. No respiratory distress.     Breath sounds: Normal breath sounds. No wheezing  or rales.  Chest:     Chest wall: No tenderness.  Abdominal:     General: Bowel sounds are normal.     Palpations: Abdomen is soft.     Tenderness: There is abdominal tenderness in the epigastric area. There is no guarding or rebound.  Musculoskeletal:        General: Normal range of motion.     Cervical back: Normal range of motion and neck supple.  Lymphadenopathy:     Cervical: No cervical adenopathy.  Skin:    General: Skin is warm and dry.     Findings: No rash.  Neurological:     Mental Status: He is alert and oriented to person, place, and time.     (all labs ordered are listed, but only  abnormal results are displayed) Labs Reviewed  CBC WITH DIFFERENTIAL/PLATELET - Abnormal; Notable for the following components:      Result Value   WBC 34.6 (*)    RBC 6.80 (*)    Hemoglobin 21.5 (*)    HCT 59.7 (*)    Neutro Abs 30.5 (*)    Monocytes Absolute 1.7 (*)    Abs Immature Granulocytes 0.52 (*)    All other components within normal limits  COMPREHENSIVE METABOLIC PANEL WITH GFR - Abnormal; Notable for the following components:   Sodium 132 (*)    Chloride 74 (*)    Glucose, Bld 210 (*)    BUN 61 (*)    Creatinine, Ser 8.44 (*)    Calcium  10.4 (*)    Total Protein 10.8 (*)    Albumin 5.8 (*)    AST 56 (*)    GFR, Estimated 8 (*)    Anion gap 36 (*)    All other components within normal limits  LACTIC ACID, PLASMA - Abnormal; Notable for the following components:   Lactic Acid, Venous 5.4 (*)    All other components within normal limits  I-STAT VENOUS BLOOD GAS, ED - Abnormal; Notable for the following components:   pCO2, Ven 40.0 (*)    pO2, Ven 31 (*)    Sodium 129 (*)    Potassium 3.4 (*)    Calcium , Ion 0.83 (*)    HCT 61.0 (*)    Hemoglobin 20.7 (*)    All other components within normal limits  TROPONIN T, HIGH SENSITIVITY - Abnormal; Notable for the following components:   Troponin T High Sensitivity 60 (*)    All other components within normal limits  TROPONIN T, HIGH SENSITIVITY - Abnormal; Notable for the following components:   Troponin T High Sensitivity 44 (*)    All other components within normal limits  CULTURE, BLOOD (ROUTINE X 2)  CULTURE, BLOOD (ROUTINE X 2)  LIPASE, BLOOD  ETHANOL  LACTIC ACID, PLASMA  URINALYSIS, ROUTINE W REFLEX MICROSCOPIC  BETA-HYDROXYBUTYRIC ACID    EKG: EKG Interpretation Date/Time:  Sunday April 18 2024 08:12:31 EDT Ventricular Rate:  129 PR Interval:  126 QRS Duration:  84 QT Interval:  312 QTC Calculation: 457 R Axis:   78  Text Interpretation: Sinus tachycardia LAE, consider biatrial enlargement  Probable left ventricular hypertrophy Nonspecific T abnormalities, lateral leads Anterior ST elevation, probably due to LVH Artifact in lead(s) II III aVR aVL aVF V1 V2 V4 V5 V6 Confirmed by Lenor Hollering [phone]) on 04/18/2024 8:31:58 AM  Radiology: CT ABDOMEN PELVIS WO CONTRAST Result Date: 04/18/2024 CLINICAL DATA:  Acute epigastric abdominal pain for several days. Nausea and vomiting. EXAM: CT ABDOMEN AND PELVIS  WITHOUT CONTRAST TECHNIQUE: Multidetector CT imaging of the abdomen and pelvis was performed following the standard protocol without IV contrast. RADIATION DOSE REDUCTION: This exam was performed according to the departmental dose-optimization program which includes automated exposure control, adjustment of the mA and/or kV according to patient size and/or use of iterative reconstruction technique. COMPARISON:  01/20/2024 FINDINGS: Lower chest: No acute findings. Hepatobiliary: No mass visualized on this unenhanced exam. Gallbladder is unremarkable. No evidence of biliary ductal dilatation. Pancreas: No mass or inflammatory process visualized on this unenhanced exam. Spleen:  Within normal limits in size. Adrenals/Urinary tract: Punctate calculus noted in lower pole of left kidney. No evidence of ureteral calculi or hydronephrosis. Unremarkable unopacified urinary bladder. Stomach/Bowel: No evidence of obstruction, inflammatory process, or abnormal fluid collections. Vascular/Lymphatic: No pathologically enlarged lymph nodes identified. No evidence of abdominal aortic aneurysm. Reproductive:  No mass or other significant abnormality. Other:  None. Musculoskeletal:  No suspicious bone lesions identified. IMPRESSION: No acute findings. Tiny left renal calculus. No evidence of ureteral calculi or hydronephrosis. Electronically Signed   By: Norleen DELENA Kil M.D.   On: 04/18/2024 10:22   DG Chest Portable 1 View Result Date: 04/18/2024 CLINICAL DATA:  Shortness of breath. EXAM: PORTABLE CHEST 1 VIEW  COMPARISON:  03/10/2022 FINDINGS: The lungs are clear without focal pneumonia, edema, pneumothorax or pleural effusion. The cardiopericardial silhouette is within normal limits for size. No acute bony abnormality. Telemetry leads overlie the chest. IMPRESSION: No active disease. Electronically Signed   By: Camellia Candle M.D.   On: 04/18/2024 08:34     Procedures   Medications Ordered in the ED  lactated ringers  infusion ( Intravenous New Bag/Given 04/18/24 1016)  promethazine  (PHENERGAN ) 25 MG/ML injection (  See Procedure Record 04/18/24 1050)  heparin  injection 5,000 Units (has no administration in time range)  sodium chloride  0.9 % bolus 1,000 mL (0 mLs Intravenous Stopped 04/18/24 1014)  ondansetron  (ZOFRAN ) injection 4 mg (4 mg Intravenous Given 04/18/24 0857)  morphine  (PF) 4 MG/ML injection 4 mg (4 mg Intravenous Given 04/18/24 0857)  morphine  (PF) 4 MG/ML injection 4 mg (4 mg Intravenous Given 04/18/24 0948)  ceFEPIme  (MAXIPIME ) 2 g in sodium chloride  0.9 % 100 mL IVPB (2 g Intravenous New Bag/Given 04/18/24 1103)  metroNIDAZOLE  (FLAGYL ) IVPB 500 mg (0 mg Intravenous Stopped 04/18/24 1102)  vancomycin  (VANCOCIN ) IVPB 1000 mg/200 mL premix (0 mg Intravenous Stopped 04/18/24 1102)  lactated ringers  bolus 1,000 mL (1,000 mLs Intravenous New Bag/Given 04/18/24 0946)  promethazine  (PHENERGAN ) 12.5 mg in sodium chloride  0.9 % 50 mL IVPB (0 mg Intravenous Stopped 04/18/24 1102)  lactated ringers  bolus 500 mL (500 mLs Intravenous Bolus from Bag 04/18/24 1111)                                    Medical Decision Making Amount and/or Complexity of Data Reviewed Labs: ordered. Radiology: ordered.  Risk Prescription drug management. Decision regarding hospitalization.   This patient presents to the ED for concern of abdominal pain, vomiting, this involves an extensive number of treatment options, and is a complaint that carries with it a high risk of complications and morbidity.  I considered the  following differential and admission for this acute, potentially life threatening condition.  The differential diagnosis includes sepsis, pancreatitis, cholecystitis, bowel obstruction, gastritis, alcohol withdrawal syndrome, renal failure, UTI/pyelonephritis  MDM:    Patient is a 38 year old who presents with upper abdominal pain associated with  vomiting.  He is ill-appearing on arrival with tachycardia with heart rates in the 120s and 130s.  He appears to be uncomfortable and is having dry heaves.  He has some upper abdominal tenderness.  He was started on IV fluids and medications for his pain and vomiting.  His labs are concerning with evidence of hemoconcentration with a markedly elevated hematocrit.  His WBC count is elevated at 34,000 with an elevated lactate over 5.  Given this, he was started on septic protocol with IV fluids and IV antibiotics.  CT scan was performed of his abdomen pelvis which do not show any evidence of acute abnormality.  No acute infection is noted.  Chest x-ray does not show any evidence of pneumonia.  He has a marked anion gap which likely represents starvation ketosis/alcoholic ketosis.  He denies any ingestions that would be more concerning for toxic alcohol ingestion.  His glucose is elevated but not hugely elevated.  Will check a beta hydroxy butyric acid level as well.  His creatinine is markedly elevated at 8.  His potassium is normal.  No signs of fluid overload.  On chart review, it looks like his creatinine has been gradually worsening over the last several months.  He has refused the last 2 admissions with elevated creatinines after recent ED visits.  Today he is willing to be admitted.  Discussed with Dr. Georgina with the hospitalist service who will admit the patient for further treatment.  CRITICAL CARE Performed by: Andrea Ness Total critical care time: 70 minutes Critical care time was exclusive of separately billable procedures and treating other  patients. Critical care was necessary to treat or prevent imminent or life-threatening deterioration. Critical care was time spent personally by me on the following activities: development of treatment plan with patient and/or surrogate as well as nursing, discussions with consultants, evaluation of patient's response to treatment, examination of patient, obtaining history from patient or surrogate, ordering and performing treatments and interventions, ordering and review of laboratory studies, ordering and review of radiographic studies, pulse oximetry and re-evaluation of patient's condition.   (Labs, imaging, consults)  Labs: I Ordered, and personally interpreted labs.  The pertinent results include: Elevated WBC count, elevated lactate, renal failure, anion gap  Imaging Studies ordered: I ordered imaging studies including CT abdomen pelvis, chest x-ray I independently visualized and interpreted imaging. I agree with the radiologist interpretation  Additional history obtained from friend at bedside.  External records from outside source obtained and reviewed including history  Cardiac Monitoring: The patient was maintained on a cardiac monitor.  If on the cardiac monitor, I personally viewed and interpreted the cardiac monitored which showed an underlying rhythm of: Sinus tachycardia  Reevaluation: After the interventions noted above, I reevaluated the patient and found that they have :improved  Social Determinants of Health:  alcohol use  Disposition: Admit to hospital  Co morbidities that complicate the patient evaluation  Past Medical History:  Diagnosis Date   Asthma    Cannabinoid hyperemesis syndrome    HIV (human immunodeficiency virus infection) (HCC)    Pancreatitis    Substance abuse (HCC)      Medicines Meds ordered this encounter  Medications   sodium chloride  0.9 % bolus 1,000 mL   ondansetron  (ZOFRAN ) injection 4 mg   morphine  (PF) 4 MG/ML injection 4 mg    morphine  (PF) 4 MG/ML injection 4 mg   lactated ringers  infusion   ceFEPIme  (MAXIPIME ) 2 g in sodium chloride  0.9 % 100 mL IVPB  Antibiotic Indication::   Other Indication (list below)    Other Indication::   Unknown Source.   metroNIDAZOLE  (FLAGYL ) IVPB 500 mg    Antibiotic Indication::   Other Indication (list below)    Other Indication::   Unknown Source.   vancomycin  (VANCOCIN ) IVPB 1000 mg/200 mL premix    Indication::   Other Indication (list below)    Other Indication::   Unknown Source.   lactated ringers  bolus 1,000 mL   promethazine  (PHENERGAN ) 12.5 mg in sodium chloride  0.9 % 50 mL IVPB   promethazine  (PHENERGAN ) 25 MG/ML injection    Austin Gravel D: cabinet override   lactated ringers  bolus 500 mL   DISCONTD: enoxaparin  (LOVENOX ) injection 40 mg   heparin  injection 5,000 Units    I have reviewed the patients home medicines and have made adjustments as needed  Problem List / ED Course: Problem List Items Addressed This Visit       Genitourinary   ARF (acute renal failure) (HCC)   Other Visit Diagnoses       Lactic acidosis    -  Primary     Dehydration                    Final diagnoses:  Lactic acidosis  Dehydration  Acute renal failure, unspecified acute renal failure type [facility])    ED Discharge Orders     None          Lenor Hollering, MD 04/18/24 1141

## 2024-04-18 NOTE — H&P (Addendum)
 History and Physical    [name] FMW:[id number] DOB: July 29, 1986 DOA: 04/18/2024  I have briefly reviewed the patient's prior medical records in [facility]  PCP: Patient, No Pcp Per  Patient coming from: med center  Chief Complaint: N/V  HPI: Carlos Guzman is a 38 y.o. male with medical history significant of polysubstance abuse, cannabinoid hyperemesis syndrome and HIV.  He comes in to the ER with complaints of not feeling well as well as nausea and vomiting.  Patient states that over the last month he has had an increase amount of alcohol use due to his daughter's murder.  Chart review shows multiple ER visits in the last year for cannabis hyperemesis, nausea vomiting, and acute kidney injury.  He states 3 days ago he developed severe nausea/vomiting with upper abdominal pain.  In the ER, he had multiple abnormal lab values including creatinine, glucose, hemoglobin, white blood cell count.  He had a CT scan that showed no abnormalities.  Urinalysis was remarkable for large hemoglobin but no RBCs.  (No CK level done yet) he was given IV fluid, morphine , Zofran , IV antibiotics, Phenergan .  TRH was consulted to admit patient for lab abnormalities and symptoms of nausea/vomiting.  Currently patient asking for liquids and to take a hot shower as he says this helps.  Overall he states he is slightly better since treatment has begun.    Review of Systems: As per HPI otherwise 10 point review of systems negative.   Past Medical History:  Diagnosis Date   Asthma    Cannabinoid hyperemesis syndrome    HIV (human immunodeficiency virus infection) (HCC)    Pancreatitis    Substance abuse (HCC)     Past Surgical History:  Procedure Laterality Date   NO PAST SURGERIES       reports that he has been smoking cigarettes. He has never used smokeless tobacco. He reports current alcohol use. He reports current drug use. Drugs: Marijuana and Cocaine.  Allergies  Allergen Reactions    Droperidol  Other (See Comments)    Extrapyramidal reaction - medicate with benadryl  first (Droperidol  is a medication that prevents nausea and vomiting caused by surgical procedures. The brand name of this medication is Inapsine .)    Family History  Problem Relation Age of Onset   CAD Neg Hx    Diabetes Neg Hx     Prior to Admission medications   Medication Sig Start Date End Date Taking? Authorizing Provider  amLODipine  (NORVASC ) 5 MG tablet Take 1 tablet (5 mg total) by mouth daily. Patient not taking: Reported on 07/18/2022 05/08/22   Ruthell Lonni FALCON, PA-C  DOVATO  50-300 MG tablet Take 1 tablet by mouth daily. 07/16/21   [provider]  metoCLOPramide  (REGLAN ) 10 MG tablet Take 1 tablet (10 mg total) by mouth 3 (three) times daily with meals. 01/15/23 01/15/24  Sofia, Leslie K, PA-C  ondansetron  (ZOFRAN ) 4 MG tablet Take 1 tablet (4 mg total) by mouth every 6 (six) hours. 12/08/23   Donnajean Lynwood DEL, PA-C  promethazine  (PHENERGAN ) 25 MG suppository Place 1 suppository (25 mg total) rectally every 6 (six) hours as needed for nausea or vomiting. Patient not taking: Reported on 08/23/2022 08/18/22   Beverley Leita LABOR, PA-C  sucralfate  (CARAFATE ) 1 g tablet Take 1 tablet (1 g total) by mouth 4 (four) times daily -  with meals and at bedtime. 06/10/19 06/10/19  Molpus, Norleen, MD    Physical Exam: Vitals:   04/18/24 1145 04/18/24 1200 04/18/24 1215 04/18/24  1422  BP:  (!) 158/143 (!) 160/124 (!) 160/112  Pulse:   (!) 111   Resp: (!) 23 13 12    Temp:    98.1 F (36.7 C)  TempSrc:    Oral  SpO2:   100%   Weight:          Constitutional: NAD, appears uncomfortable-- unable to stay still ENMT: Mucous membranes are moist. Posterior pharynx clear of any exudate or lesions.Normal dentition.  Neck: normal, supple, no masses, no thyromegaly Respiratory: clear to auscultation bilaterally, no wheezing, no crackles. Normal respiratory effort. No accessory muscle use.  Cardiovascular:  sinus tachy Abdomen: no tenderness, no masses palpated. Bowel sounds positive.  Musculoskeletal: no clubbing / cyanosis. Normal muscle tone.  Skin: no rashes, lesions, ulcers. No induration Neurologic: CN 2-12 grossly intact. Strength 5/5 in all 4.  Psychiatric: Normal judgment and insight. Alert and oriented x 3. Normal mood.   Labs on Admission: I have personally reviewed following labs and imaging studies  CBC: Recent Labs  Lab 04/18/24 0843 04/18/24 0959  WBC 34.6*  --   NEUTROABS 30.5*  --   HGB 21.5* 20.7*  HCT 59.7* 61.0*  MCV 87.8  --   PLT 373  --    Basic Metabolic Panel: Recent Labs  Lab 04/18/24 0843 04/18/24 0959  NA 132* 129*  K 3.9 3.4*  CL 74*  --   CO2 22  --   GLUCOSE 210*  --   BUN 61*  --   CREATININE 8.44*  --   CALCIUM  10.4*  --    GFR: Estimated Creatinine Clearance: 13 mL/min (A) (by C-G formula based on SCr of 8.44 mg/dL (H)). Liver Function Tests: Recent Labs  Lab 04/18/24 0843  AST 56*  ALT 42  ALKPHOS 87  BILITOT 1.0  PROT 10.8*  ALBUMIN 5.8*   Recent Labs  Lab 04/18/24 0843  LIPASE 35   No results for input(s): AMMONIA in the last 168 hours. Coagulation Profile: No results for input(s): INR, PROTIME in the last 168 hours. Cardiac Enzymes: No results for input(s): CKTOTAL, CKMB, CKMBINDEX, TROPONINI in the last 168 hours. BNP (last 3 results) No results for input(s): PROBNP in the last 8760 hours. HbA1C: No results for input(s): HGBA1C in the last 72 hours. CBG: No results for input(s): GLUCAP in the last 168 hours. Lipid Profile: No results for input(s): CHOL, HDL, LDLCALC, TRIG, CHOLHDL, LDLDIRECT in the last 72 hours. Thyroid  Function Tests: No results for input(s): TSH, T4TOTAL, FREET4, T3FREE, THYROIDAB in the last 72 hours. Anemia Panel: No results for input(s): VITAMINB12, FOLATE, FERRITIN, TIBC, IRON, RETICCTPCT in the last 72 hours. Urine analysis:     Component Value Date/Time   COLORURINE YELLOW 04/18/2024 1114   APPEARANCEUR CLEAR 04/18/2024 1114   APPEARANCEUR Clear 11/24/2013 1333   LABSPEC 1.025 04/18/2024 1114   LABSPEC 1.030 11/24/2013 1333   PHURINE 5.5 04/18/2024 1114   GLUCOSEU NEGATIVE 04/18/2024 1114   GLUCOSEU Negative 11/24/2013 1333   HGBUR LARGE (A) 04/18/2024 1114   BILIRUBINUR SMALL (A) 04/18/2024 1114   BILIRUBINUR 1+ 11/24/2013 1333   KETONESUR NEGATIVE 04/18/2024 1114   PROTEINUR 100 (A) 04/18/2024 1114   UROBILINOGEN 1.0 01/04/2015 1651   NITRITE NEGATIVE 04/18/2024 1114   LEUKOCYTESUR NEGATIVE 04/18/2024 1114   LEUKOCYTESUR Negative 11/24/2013 1333     Radiological Exams on Admission: CT ABDOMEN PELVIS WO CONTRAST Result Date: 04/18/2024 CLINICAL DATA:  Acute epigastric abdominal pain for several days. Nausea and vomiting. EXAM: CT ABDOMEN AND  PELVIS WITHOUT CONTRAST TECHNIQUE: Multidetector CT imaging of the abdomen and pelvis was performed following the standard protocol without IV contrast. RADIATION DOSE REDUCTION: This exam was performed according to the departmental dose-optimization program which includes automated exposure control, adjustment of the mA and/or kV according to patient size and/or use of iterative reconstruction technique. COMPARISON:  01/20/2024 FINDINGS: Lower chest: No acute findings. Hepatobiliary: No mass visualized on this unenhanced exam. Gallbladder is unremarkable. No evidence of biliary ductal dilatation. Pancreas: No mass or inflammatory process visualized on this unenhanced exam. Spleen:  Within normal limits in size. Adrenals/Urinary tract: Punctate calculus noted in lower pole of left kidney. No evidence of ureteral calculi or hydronephrosis. Unremarkable unopacified urinary bladder. Stomach/Bowel: No evidence of obstruction, inflammatory process, or abnormal fluid collections. Vascular/Lymphatic: No pathologically enlarged lymph nodes identified. No evidence of abdominal aortic  aneurysm. Reproductive:  No mass or other significant abnormality. Other:  None. Musculoskeletal:  No suspicious bone lesions identified. IMPRESSION: No acute findings. Tiny left renal calculus. No evidence of ureteral calculi or hydronephrosis. Electronically Signed   By: Norleen DELENA Kil M.D.   On: 04/18/2024 10:22   DG Chest Portable 1 View Result Date: 04/18/2024 CLINICAL DATA:  Shortness of breath. EXAM: PORTABLE CHEST 1 VIEW COMPARISON:  03/10/2022 FINDINGS: The lungs are clear without focal pneumonia, edema, pneumothorax or pleural effusion. The cardiopericardial silhouette is within normal limits for size. No acute bony abnormality. Telemetry leads overlie the chest. IMPRESSION: No active disease. Electronically Signed   By: Camellia Candle M.D.   On: 04/18/2024 08:34      Assessment/Plan Principal Problem:   Acute renal failure (ARF) (HCC)   N/V-- h/o hyperemesis syndrome -CT scan unrevealing -lipase normal -IV emetics -hot shower (says this helped at home) -PRN pain meds  Lactic acidosis -IVF and recheck  Alcohol abuse -has increased drinking since daughter was murdered in July -CIWA protocol -denies methanol /isopropyl alcohol ingestion   AKI-- ? Rhabdo -IVF -if not improved with IVF will get renal U/S -U/A-- large hgb but no RBC so will get CK -repeat labs-- do not think renal needs consulted for HD (as of yet)  HIV -on dovato  (will hold until rhabdo resolved)  Hypokalemia -replete  Hyperglycemia -SSI for now- check HgbA1c  Leukocytosis -no clear infection -? Reactive so will hold on further abx (got doses in ER) -trend  Mild elevation of troponin -suspect demand -check UDS-suspect cocaine may have hastened this demand  Increased Hgb -? From volume contraction -baseline: 17-18--- can have further work up outpatient  Polysubstance abuse - Check UDS     DVT prophylaxis: heparin   Code Status: full        Harlene RAYMOND [facility]   How to contact the [facility] Attending or Consulting provider 7A - 7P or covering provider during after hours 7P -7A, for this patient?  Check the care team in [place] and look for a) attending/consulting [facility] provider listed and b) the [facility] team listed Log into [email or web address] and use [place]'s universal password to access. If you do not have the password, please contact the hospital operator. Locate the [facility] provider you are looking for under [facility] and page to a number that you can be directly reached. If you still have difficulty reaching the provider, please page the [facility] (Director on Call) for the Hospitalists listed on amion for assistance.  04/18/2024, 2:46 PM

## 2024-04-18 NOTE — ED Notes (Signed)
 Returned from CT. Pt cables had to be reconnected. Pt placed in position of comfort, he's shivering but denies being hot or cold. Pt is most comfortable sitting straight up in the air. Active emesis.

## 2024-04-19 DIAGNOSIS — N179 Acute kidney failure, unspecified: Secondary | ICD-10-CM | POA: Diagnosis not present

## 2024-04-19 LAB — HEMOGLOBIN A1C
Hgb A1c MFr Bld: 5.5 % (ref 4.8–5.6)
Mean Plasma Glucose: 111 mg/dL

## 2024-04-19 LAB — COMPREHENSIVE METABOLIC PANEL WITH GFR
ALT: 32 U/L (ref 0–44)
AST: 49 U/L — ABNORMAL HIGH (ref 15–41)
Albumin: 3.7 g/dL (ref 3.5–5.0)
Alkaline Phosphatase: 48 U/L (ref 38–126)
Anion gap: 15 (ref 5–15)
BUN: 44 mg/dL — ABNORMAL HIGH (ref 6–20)
CO2: 25 mmol/L (ref 22–32)
Calcium: 8.8 mg/dL — ABNORMAL LOW (ref 8.9–10.3)
Chloride: 90 mmol/L — ABNORMAL LOW (ref 98–111)
Creatinine, Ser: 2.28 mg/dL — ABNORMAL HIGH (ref 0.61–1.24)
GFR, Estimated: 37 mL/min — ABNORMAL LOW (ref >60)
Glucose, Bld: 109 mg/dL — ABNORMAL HIGH (ref 70–99)
Potassium: 4 mmol/L (ref 3.5–5.1)
Sodium: 130 mmol/L — ABNORMAL LOW (ref 135–145)
Total Bilirubin: 2.2 mg/dL — ABNORMAL HIGH (ref 0.0–1.2)
Total Protein: 7 g/dL (ref 6.5–8.1)

## 2024-04-19 LAB — CBC
HCT: 45.2 % (ref 39.0–52.0)
Hemoglobin: 16.1 g/dL (ref 13.0–17.0)
MCH: 31.9 pg (ref 26.0–34.0)
MCHC: 35.6 g/dL (ref 30.0–36.0)
MCV: 89.7 fL (ref 80.0–100.0)
Platelets: 242 K/uL (ref 150–400)
RBC: 5.04 MIL/uL (ref 4.22–5.81)
RDW: 12.4 % (ref 11.5–15.5)
WBC: 16.5 K/uL — ABNORMAL HIGH (ref 4.0–10.5)
nRBC: 0 % (ref 0.0–0.2)

## 2024-04-19 LAB — GLUCOSE, CAPILLARY
Glucose-Capillary: 100 mg/dL — ABNORMAL HIGH (ref 70–99)
Glucose-Capillary: 105 mg/dL — ABNORMAL HIGH (ref 70–99)
Glucose-Capillary: 116 mg/dL — ABNORMAL HIGH (ref 70–99)
Glucose-Capillary: 133 mg/dL — ABNORMAL HIGH (ref 70–99)
Glucose-Capillary: 158 mg/dL — ABNORMAL HIGH (ref 70–99)
Glucose-Capillary: 87 mg/dL (ref 70–99)

## 2024-04-19 LAB — T-HELPER CELLS (CD4) COUNT (NOT AT ARMC)
CD4 % Helper T Cell: 45 % (ref 33–65)
CD4 T Cell Abs: 870 /uL (ref 400–1790)

## 2024-04-19 LAB — CK: Total CK: 1530 U/L — ABNORMAL HIGH (ref 49–397)

## 2024-04-19 MED ORDER — PANTOPRAZOLE SODIUM 40 MG PO TBEC
40.0000 mg | DELAYED_RELEASE_TABLET | Freq: Every day | ORAL | Status: DC
  Administered 2024-04-19 – 2024-04-20 (×4): 40 mg via ORAL
  Filled 2024-04-19 (×2): qty 1

## 2024-04-19 MED ORDER — INSULIN ASPART 100 UNIT/ML IJ SOLN
0.0000 [IU] | Freq: Three times a day (TID) | INTRAMUSCULAR | Status: DC
  Administered 2024-04-19 – 2024-04-20 (×4): 2 [IU] via SUBCUTANEOUS

## 2024-04-19 MED ORDER — METOCLOPRAMIDE HCL 10 MG PO TABS
10.0000 mg | ORAL_TABLET | Freq: Three times a day (TID) | ORAL | Status: DC
  Administered 2024-04-19 – 2024-04-20 (×4): 10 mg via ORAL
  Filled 2024-04-19 (×2): qty 1

## 2024-04-19 MED ORDER — LACTATED RINGERS IV SOLN: INTRAVENOUS | Status: DC

## 2024-04-19 MED ORDER — POTASSIUM CHLORIDE CRYS ER 20 MEQ PO TBCR
40.0000 meq | EXTENDED_RELEASE_TABLET | Freq: Once | ORAL | Status: AC
  Administered 2024-04-19 (×2): 40 meq via ORAL
  Filled 2024-04-19: qty 2

## 2024-04-19 NOTE — Progress Notes (Signed)
 PROGRESS NOTE    Tijuan Dantes  FMW:[id number] DOB: 1986-06-12 DOA: 04/18/2024 PCP: Patient, No Pcp Per     Brief Narrative:  Harinder Romas is a 38 y.o. male with medical history significant of polysubstance abuse, cannabinoid hyperemesis syndrome and HIV.  He comes in to the ER with complaints of not feeling well as well as nausea and vomiting.  Patient states that over the last month he has had an increase amount of alcohol use due to his daughter's murder.  Chart review shows multiple ER visits in the last year for cannabis hyperemesis, nausea vomiting, and acute kidney injury.  He states 3 days ago he developed severe nausea/vomiting with upper abdominal pain.   In the ER, he had multiple abnormal lab values including creatinine, glucose, hemoglobin, white blood cell count.  He had a CT scan that showed no abnormalities.  Urinalysis was remarkable for large hemoglobin but no RBCs.  (No CK level done yet) he was given IV fluid, morphine , Zofran , IV antibiotics, Phenergan .  TRH was consulted to admit patient for lab abnormalities and symptoms of nausea/vomiting.   New events last 24 hours / Subjective: Patient reports that he is feeling a little bit better this morning.  Assessment & Plan:  Principal Problem:   Acute renal failure (ARF) (HCC) Active Problems:   AKI (acute kidney injury) (HCC)   HIV (human immunodeficiency virus infection) (HCC)   Hypokalemia   Leukocytosis   Rhabdomyolysis   Intractable nausea and vomiting   Polysubstance abuse (HCC)   Nausea and vomiting in setting of hyperemesis syndrome - Continue supportive care  AKI - Baseline creatinine ranges between 1.4-2.6 - In setting of prerenal etiology, nausea, vomiting - Improving on IV fluids.  Continue  Mild rhabdomyolysis - CK 2948 - Repeat CK today  SIRS - Presented with tachycardia, tachypnea, leukocytosis, lactic acidosis - Lactic acidosis resolved.  Leukocytosis improving on fluid - UA negative -  Blood cultures pending - CT abdomen pelvis unremarkable - Chest x-ray unremarkable - No evidence of infectious process.  This is likely concentration in setting of dehydration  Alcohol abuse - CIWA protocol  HIV - CD4 and RNA pending - Dovato  outpatient  Hyperglycemia - A1c pending  Hypokalemia - Replace  Demand ischemia -Troponin 44   DVT prophylaxis:  heparin  injection 5,000 Units Start: 04/18/24 1400  Code Status: Full code Family Communication: None Disposition Plan: Home Status is: Inpatient Remains inpatient appropriate because: IV fluid    Antimicrobials:  Anti-infectives (From admission, onward)    Start     Dose/Rate Route Frequency Ordered Stop   04/18/24 0945  ceFEPIme  (MAXIPIME ) 2 g in sodium chloride  0.9 % 100 mL IVPB        2 g 200 mL/hr over 30 Minutes Intravenous  Once 04/18/24 0941 04/18/24 1200   04/18/24 0945  metroNIDAZOLE  (FLAGYL ) IVPB 500 mg        500 mg 100 mL/hr over 60 Minutes Intravenous  Once 04/18/24 0941 04/18/24 1102   04/18/24 0945  vancomycin  (VANCOCIN ) IVPB 1000 mg/200 mL premix        1,000 mg 200 mL/hr over 60 Minutes Intravenous  Once 04/18/24 0941 04/18/24 1102        Objective: Vitals:   04/19/24 0743 04/19/24 0800 04/19/24 0850 04/19/24 1000  BP: (!) 183/102 (!) 154/98 (!) 153/79 (!) 166/86  Pulse: 83  84   Resp: 16 (!) 25  14  Temp: 98.2 F (36.8 C)     TempSrc: Oral  SpO2:      Weight:      Height:        Intake/Output Summary (Last 24 hours) at 04/19/2024 1130 Last data filed at 04/19/2024 1100 Gross per 24 hour  Intake 2959.04 ml  Output --  Net 2959.04 ml   Filed Weights   04/18/24 0945  Weight: 78.5 kg    Examination:  General exam: Appears calm and comfortable  Respiratory system: Clear to auscultation. Respiratory effort normal. No respiratory distress. No conversational dyspnea.  Cardiovascular system: S1 & S2 heard, RRR. No murmurs. No pedal edema. Gastrointestinal system: Abdomen is  nondistended, soft and nontender. Normal bowel sounds heard. Central nervous system: Alert and oriented. No focal neurological deficits. Speech clear.  Extremities: Symmetric in appearance  Skin: No rashes, lesions or ulcers on exposed skin  Psychiatry: Judgement and insight appear normal. Mood & affect appropriate.   Data Reviewed: I have personally reviewed following labs and imaging studies  CBC: Recent Labs  Lab 04/18/24 0843 04/18/24 0959 04/18/24 2115 04/19/24 0833  WBC 34.6*  --  25.4* 16.5*  NEUTROABS 30.5*  --   --   --   HGB 21.5* 20.7* 16.9 16.1  HCT 59.7* 61.0* 47.2 45.2  MCV 87.8  --  87.4 89.7  PLT 373  --  295 242   Basic Metabolic Panel: Recent Labs  Lab 04/18/24 0843 04/18/24 0959 04/18/24 2115 04/19/24 0833  NA 132* 129* 133* 130*  K 3.9 3.4* 3.3* 4.0  CL 74*  --  88* 90*  CO2 22  --  28 25  GLUCOSE 210*  --  149* 109*  BUN 61*  --  58* 44*  CREATININE 8.44*  --  4.42* 2.28*  CALCIUM  10.4*  --  8.7* 8.8*  MG  --   --  1.7  --    GFR: Estimated Creatinine Clearance: 48.3 mL/min (A) (by C-G formula based on SCr of 2.28 mg/dL (H)). Liver Function Tests: Recent Labs  Lab 04/18/24 0843 04/19/24 0833  AST 56* 49*  ALT 42 32  ALKPHOS 87 48  BILITOT 1.0 2.2*  PROT 10.8* 7.0  ALBUMIN 5.8* 3.7   Recent Labs  Lab 04/18/24 0843  LIPASE 35   No results for input(s): AMMONIA in the last 168 hours. Coagulation Profile: No results for input(s): INR, PROTIME in the last 168 hours. Cardiac Enzymes: Recent Labs  Lab 04/18/24 2115  CKTOTAL 2,948*   BNP (last 3 results) No results for input(s): PROBNP in the last 8760 hours. HbA1C: No results for input(s): HGBA1C in the last 72 hours. CBG: Recent Labs  Lab 04/18/24 1526 04/18/24 2032 04/18/24 2341 04/19/24 0358 04/19/24 0743  GLUCAP 176* 151* 151* 133* 116*   Lipid Profile: No results for input(s): CHOL, HDL, LDLCALC, TRIG, CHOLHDL, LDLDIRECT in the last 72  hours. Thyroid  Function Tests: No results for input(s): TSH, T4TOTAL, FREET4, T3FREE, THYROIDAB in the last 72 hours. Anemia Panel: No results for input(s): VITAMINB12, FOLATE, FERRITIN, TIBC, IRON, RETICCTPCT in the last 72 hours. Sepsis Labs: Recent Labs  Lab 04/18/24 9156 04/18/24 1135 04/18/24 2115  LATICACIDVEN 5.4* 3.7* 1.7    Recent Results (from the past 240 hours)  Blood culture (routine x 2)     Status: None (Preliminary result)   Collection Time: 04/18/24  8:43 AM   Specimen: BLOOD  Result Value Ref Range Status   Specimen Description   Final    BLOOD RIGHT ANTECUBITAL Performed at [facility], [address] [place], [place] [address]    Special Requests   Final    BOTTLES DRAWN AEROBIC AND ANAEROBIC Blood Culture adequate volume Performed at [facility], [address] [place], [place] [address]    Culture   Final    NO GROWTH < 24 HOURS Performed at [facility], [address] [place], [place] [address]    Report Status PENDING  Incomplete  Blood culture (routine x 2)     Status: None (Preliminary result)   Collection Time: 04/18/24  9:14 AM   Specimen: BLOOD  Result Value Ref Range Status   Specimen Description   Final    BLOOD LEFT ANTECUBITAL Performed at [facility], [address] [place], [place] [address]    Special Requests   Final    BOTTLES DRAWN AEROBIC AND ANAEROBIC Blood Culture results may not be optimal due to an inadequate volume of blood received in culture bottles Performed at [facility], [address] [place], [place] [address]    Culture   Final    NO GROWTH < 24 HOURS Performed at [facility], [address] [place], [place] [address]    Report Status PENDING  Incomplete      Radiology Studies: CT ABDOMEN PELVIS WO CONTRAST Result Date: 04/18/2024 CLINICAL DATA:  Acute epigastric abdominal pain for several days. Nausea and vomiting. EXAM:  CT ABDOMEN AND PELVIS WITHOUT CONTRAST TECHNIQUE: Multidetector CT imaging of the abdomen and pelvis was performed following the standard protocol without IV contrast. RADIATION DOSE REDUCTION: This exam was performed according to the departmental dose-optimization program which includes automated exposure control, adjustment of the mA and/or kV according to patient size and/or use of iterative reconstruction technique. COMPARISON:  01/20/2024 FINDINGS: Lower chest: No acute findings. Hepatobiliary: No mass visualized on this unenhanced exam. Gallbladder is unremarkable. No evidence of biliary ductal dilatation. Pancreas: No mass or inflammatory process visualized on this unenhanced exam. Spleen:  Within normal limits in size. Adrenals/Urinary tract: Punctate calculus noted in lower pole of left kidney. No evidence of ureteral calculi or hydronephrosis. Unremarkable unopacified urinary bladder. Stomach/Bowel: No evidence of obstruction, inflammatory process, or abnormal fluid collections. Vascular/Lymphatic: No pathologically enlarged lymph nodes identified. No evidence of abdominal aortic aneurysm. Reproductive:  No mass or other significant abnormality. Other:  None. Musculoskeletal:  No suspicious bone lesions identified. IMPRESSION: No acute findings. Tiny left renal calculus. No evidence of ureteral calculi or hydronephrosis. Electronically Signed   By: Norleen DELENA Kil M.D.   On: 04/18/2024 10:22   DG Chest Portable 1 View Result Date: 04/18/2024 CLINICAL DATA:  Shortness of breath. EXAM: PORTABLE CHEST 1 VIEW COMPARISON:  03/10/2022 FINDINGS: The lungs are clear without focal pneumonia, edema, pneumothorax or pleural effusion. The cardiopericardial silhouette is within normal limits for size. No acute bony abnormality. Telemetry leads overlie the chest. IMPRESSION: No active disease. Electronically Signed   By: Camellia Candle M.D.   On: 04/18/2024 08:34      Scheduled Meds:  folic acid   1 mg Oral Daily    heparin   5,000 Units Subcutaneous Q8H   insulin  aspart  0-9 Units Subcutaneous Q4H   LORazepam   0-4 mg Intravenous Q4H   Followed by   NOREEN ON 04/20/2024] LORazepam   0-4 mg Intravenous Q8H   multivitamin with minerals  1 tablet Oral Daily   thiamine   100 mg Oral Daily   Or   thiamine   100 mg Intravenous  Daily   Continuous Infusions:  lactated ringers  125 mL/hr at 04/19/24 0848     LOS: 1 day   Time spent: 25 minutes   Delon Hoe, DO [facility] 04/19/2024, 11:30 AM   Available via Epic secure chat 7am-7pm After these hours, please refer to coverage provider listed on [email or web address]

## 2024-04-19 NOTE — TOC Initial Note (Addendum)
 Transition of Care [facility]) - Initial/Assessment Note    Patient Details  Name: Carlos Guzman MRN: [id number] Date of Birth: Jun 21, 1986  Transition of Care [facility]) CM/SW Contact:    Sherline Clack, LCSWA Phone Number: 04/19/2024, 1:10 PM  Clinical Narrative:                  CSW spoke with pt about substance use. Pt shared with CSW he drinks alcohol and smokes marijuana. When asked if he would like resources for substance use, pt declined. CSW also asked pt about his primary care provider. Pt states he does not have PCP and expressed interest in getting connected to a provider. CSW will reach out to CM to find a PCP for pt.      Patient Goals and CMS Choice       Expected Discharge Plan and Services                                              Prior Living Arrangements/Services                      Activities of Daily Living   ADL Screening (condition at time of admission) Independently performs ADLs?: Yes (appropriate for developmental age) Is the patient deaf or have difficulty hearing?: No Does the patient have difficulty seeing, even when wearing glasses/contacts?: No Does the patient have difficulty concentrating, remembering, or making decisions?: No  Permission Sought/Granted                  Emotional Assessment              Admission diagnosis:  Dehydration [E86.0] Lactic acidosis [E87.20] Acute renal failure (ARF) (HCC) [N17.9] Acute renal failure, unspecified acute renal failure type (HCC) [N17.9] Patient Active Problem List   Diagnosis Date Noted   Acute renal failure (ARF) (HCC) 04/18/2024   Polysubstance abuse (HCC) 10/25/2021   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   Acute kidney failure (HCC) 10/24/2021   Intractable nausea and vomiting 09/06/2021   CKD (chronic kidney disease), stage III (HCC) 09/06/2021   Prolonged QT interval 09/06/2021   Cocaine abuse (HCC) 09/06/2021   ARF (acute renal failure) (HCC)  07/29/2019   Hyperkalemia 07/29/2019   Left upper quadrant abdominal pain 07/29/2019   Metabolic acidosis, increased anion gap 07/29/2019   Hyperbilirubinemia 07/29/2019   Hyponatremia 07/29/2019   Rhabdomyolysis 07/24/2019   AKI (acute kidney injury) (HCC) 07/23/2019   HIV (human immunodeficiency virus infection) (HCC) 07/23/2019   Cannabinoid hyperemesis syndrome 07/23/2019   Hypokalemia 07/23/2019   Leukocytosis 07/23/2019   Tobacco abuse 07/23/2019   Alcohol abuse, in remission 07/23/2019   PCP:  Patient, No Pcp Per Pharmacy:   [id number] - [place], [place] - [address] [place] [address] Phone: [phone] Fax: [phone]  [id number] - [place], [place] - [address] [place] [address] Phone: [phone] Fax: [phone]  [facility] [place] [facility] [address] [place] [address] Phone: [phone] Fax: [phone]     Social Drivers of Health (SDOH) Social History: SDOH Screenings   Food Insecurity: No Food Insecurity (04/18/2024)  Housing: Unknown (04/18/2024)  Transportation Needs: Patient Declined (04/18/2024)  Utilities: Not At Risk (04/18/2024)  Tobacco Use: High Risk (04/18/2024)   SDOH Interventions:     Readmission Risk Interventions     No data to display

## 2024-04-20 DIAGNOSIS — N179 Acute kidney failure, unspecified: Secondary | ICD-10-CM | POA: Diagnosis not present

## 2024-04-20 LAB — BASIC METABOLIC PANEL WITH GFR
Anion gap: 11 (ref 5–15)
BUN: 25 mg/dL — ABNORMAL HIGH (ref 6–20)
CO2: 28 mmol/L (ref 22–32)
Calcium: 9.1 mg/dL (ref 8.9–10.3)
Chloride: 94 mmol/L — ABNORMAL LOW (ref 98–111)
Creatinine, Ser: 1.29 mg/dL — ABNORMAL HIGH (ref 0.61–1.24)
GFR, Estimated: >60 mL/min (ref >60)
Glucose, Bld: 105 mg/dL — ABNORMAL HIGH (ref 70–99)
Potassium: 3.6 mmol/L (ref 3.5–5.1)
Sodium: 133 mmol/L — ABNORMAL LOW (ref 135–145)

## 2024-04-20 LAB — CBC
HCT: 44.9 % (ref 39.0–52.0)
Hemoglobin: 15.4 g/dL (ref 13.0–17.0)
MCH: 31.6 pg (ref 26.0–34.0)
MCHC: 34.3 g/dL (ref 30.0–36.0)
MCV: 92.2 fL (ref 80.0–100.0)
Platelets: 219 K/uL (ref 150–400)
RBC: 4.87 MIL/uL (ref 4.22–5.81)
RDW: 12.2 % (ref 11.5–15.5)
WBC: 10.7 K/uL — ABNORMAL HIGH (ref 4.0–10.5)
nRBC: 0 % (ref 0.0–0.2)

## 2024-04-20 LAB — HIV-1 RNA QUANT-NO REFLEX-BLD
HIV 1 RNA Quant: <20 {copies}/mL
LOG10 HIV-1 RNA: UNDETERMINED {Log_copies}/mL

## 2024-04-20 LAB — GLUCOSE, CAPILLARY: Glucose-Capillary: 151 mg/dL — ABNORMAL HIGH (ref 70–99)

## 2024-04-20 NOTE — TOC Transition Note (Signed)
 Transition of Care [facility]) - Discharge Note   Patient Details  Name: Carlos Guzman MRN: [id number] Date of Birth: 02/03/1986  Transition of Care [facility]) CM/SW Contact:  Marval Gell, RN Phone Number: 04/20/2024, 8:46 AM   Clinical Narrative:      Met with patient at bedside.  Reviewed that grief resources are placed on AVS, reminded to call Insurance to see who his assigned PCP is through [place], offered alternative to call [facility] to schedule PCP.  Final next level of care: Home/Self Care Barriers to Discharge: No Barriers Identified   Patient Goals and CMS Choice Patient states their goals for this hospitalization and ongoing recovery are:: To feel better and return home          Discharge Placement                       Discharge Plan and Services Additional resources added to the After Visit Summary for                  DME Arranged: N/A         HH Arranged: NA          Social Drivers of Health (SDOH) Interventions SDOH Screenings   Food Insecurity: No Food Insecurity (04/18/2024)  Housing: Unknown (04/18/2024)  Transportation Needs: Patient Declined (04/18/2024)  Utilities: Not At Risk (04/18/2024)  Tobacco Use: High Risk (04/18/2024)     Readmission Risk Interventions     No data to display

## 2024-04-20 NOTE — Discharge Summary (Signed)
 Physician Discharge Summary  Carlos Guzman FMW:[id number] DOB: [birth year] DOA: 04/18/2024  PCP: Patient, No Pcp Per  Admit date: 04/18/2024 Discharge date: 04/20/2024  Admitted From: Home Disposition:  Home   Recommendations for Outpatient Follow-up:  Follow up with PCP   Discharge Condition: Stable CODE STATUS: Full  Diet recommendation: Regular   Brief/Interim Summary: Carlos Guzman is a 38 y.o. male with medical history significant of polysubstance abuse, cannabinoid hyperemesis syndrome and HIV.  He comes in to the ER with complaints of not feeling well as well as nausea and vomiting.  Patient states that over the last month he has had an increase amount of alcohol use due to his daughter's murder.  Chart review shows multiple ER visits in the last year for cannabis hyperemesis, nausea vomiting, and acute kidney injury.  He states 3 days ago he developed severe nausea/vomiting with upper abdominal pain.   In the ER, he had multiple abnormal lab values including creatinine, glucose, hemoglobin, white blood cell count.  He had a CT scan that showed no abnormalities.  Urinalysis was remarkable for large hemoglobin but no RBCs.  (No CK level done yet) he was given IV fluid, morphine , Zofran , IV antibiotics, Phenergan .  TRH was consulted to admit patient for lab abnormalities and symptoms of nausea/vomiting.   Patient was given IVF with rapid improvement in Cr, CK and WBC.   Discharge Diagnoses:   Principal Problem:   Acute renal failure (ARF) (HCC) Active Problems:   AKI (acute kidney injury) (HCC)   HIV (human immunodeficiency virus infection) (HCC)   Hypokalemia   Leukocytosis   Rhabdomyolysis   Intractable nausea and vomiting   Polysubstance abuse (HCC)   Nausea and vomiting in setting of hyperemesis syndrome - Continue supportive care - Improved    AKI - Baseline creatinine ranges between 1.4-2.6 - In setting of prerenal etiology, nausea, vomiting - Resolved with IVF     Mild rhabdomyolysis - CK 2948-->1530 with IVF  SIRS - Presented with tachycardia, tachypnea, leukocytosis, lactic acidosis - Lactic acidosis resolved.  Leukocytosis improving on fluid - UA negative - Blood cultures pending - CT abdomen pelvis unremarkable - Chest x-ray unremarkable - No evidence of infectious process.  This is likely concentration in setting of dehydration   Alcohol abuse - CIWA protocol   HIV - Dovato  outpatient   Hyperglycemia - A1c 5.5    Hypokalemia - Replaced   Demand ischemia -Troponin 44  Discharge Instructions  Discharge Instructions     Call MD for:  difficulty breathing, headache or visual disturbances   Complete by: As directed    Call MD for:  extreme fatigue   Complete by: As directed    Call MD for:  persistant dizziness or light-headedness   Complete by: As directed    Call MD for:  persistant nausea and vomiting   Complete by: As directed    Call MD for:  severe uncontrolled pain   Complete by: As directed    Call MD for:  temperature >100.4   Complete by: As directed    Diet general   Complete by: As directed    Discharge instructions   Complete by: As directed    You were cared for by a hospitalist during your hospital stay. If you have any questions about your discharge medications or the care you received while you were in the hospital after you are discharged, you can call the unit and ask to speak with the hospitalist on call if the hospitalist  that took care of you is not available. Once you are discharged, your primary care physician will handle any further medical issues. Please note that NO REFILLS for any discharge medications will be authorized once you are discharged, as it is imperative that you return to your primary care physician (or establish a relationship with a primary care physician if you do not have one) for your aftercare needs so that they can reassess your need for medications and monitor your lab values.    Increase activity slowly   Complete by: As directed       Allergies as of 04/20/2024       Reactions   Droperidol  Other (See Comments)   Extrapyramidal reaction - medicate with benadryl  first (Droperidol  is a medication that prevents nausea and vomiting caused by surgical procedures. The brand name of this medication is Inapsine .)        Medication List     TAKE these medications    amLODipine  5 MG tablet Commonly known as: NORVASC  Take 1 tablet (5 mg total) by mouth daily.   Dovato  50-300 MG tablet Generic drug: dolutegravir -lamiVUDine  Take 1 tablet by mouth daily.   metoCLOPramide  10 MG tablet Commonly known as: REGLAN  Take 1 tablet (10 mg total) by mouth 3 (three) times daily with meals.   omeprazole  40 MG capsule Commonly known as: PRILOSEC Take 40 mg by mouth daily.   ondansetron  4 MG tablet Commonly known as: ZOFRAN  Take 1 tablet (4 mg total) by mouth every 6 (six) hours.   promethazine  25 MG suppository Commonly known as: PHENERGAN  Place 1 suppository (25 mg total) rectally every 6 (six) hours as needed for nausea or vomiting.        Follow-up Information     [facility] Follow up.   Contact information: [address] [employer] [address] [place]  [address] [phone]        [facility] Follow up.   Why: [email or web address]               Allergies  Allergen Reactions   Droperidol  Other (See Comments)    Extrapyramidal reaction - medicate with benadryl  first (Droperidol  is a medication that prevents nausea and vomiting caused by surgical procedures. The brand name of this medication is Inapsine .)     Procedures/Studies: CT ABDOMEN PELVIS WO CONTRAST Result Date: 04/18/2024 CLINICAL DATA:  Acute epigastric abdominal pain for several days. Nausea and vomiting. EXAM: CT ABDOMEN AND PELVIS WITHOUT CONTRAST TECHNIQUE: Multidetector CT imaging of the  abdomen and pelvis was performed following the standard protocol without IV contrast. RADIATION DOSE REDUCTION: This exam was performed according to the departmental dose-optimization program which includes automated exposure control, adjustment of the mA and/or kV according to patient size and/or use of iterative reconstruction technique. COMPARISON:  01/20/2024 FINDINGS: Lower chest: No acute findings. Hepatobiliary: No mass visualized on this unenhanced exam. Gallbladder is unremarkable. No evidence of biliary ductal dilatation. Pancreas: No mass or inflammatory process visualized on this unenhanced exam. Spleen:  Within normal limits in size. Adrenals/Urinary tract: Punctate calculus noted in lower pole of left kidney. No evidence of ureteral calculi or hydronephrosis. Unremarkable unopacified urinary bladder. Stomach/Bowel: No evidence of obstruction, inflammatory process, or abnormal fluid collections. Vascular/Lymphatic: No pathologically enlarged lymph nodes identified. No evidence of abdominal aortic aneurysm. Reproductive:  No mass or other significant abnormality. Other:  None. Musculoskeletal:  No suspicious bone lesions identified. IMPRESSION: No acute findings. Tiny left renal calculus. No evidence of ureteral  calculi or hydronephrosis. Electronically Signed   By: Norleen DELENA Kil M.D.   On: 04/18/2024 10:22   DG Chest Portable 1 View Result Date: 04/18/2024 CLINICAL DATA:  Shortness of breath. EXAM: PORTABLE CHEST 1 VIEW COMPARISON:  03/10/2022 FINDINGS: The lungs are clear without focal pneumonia, edema, pneumothorax or pleural effusion. The cardiopericardial silhouette is within normal limits for size. No acute bony abnormality. Telemetry leads overlie the chest. IMPRESSION: No active disease. Electronically Signed   By: Camellia Candle M.D.   On: 04/18/2024 08:34      Discharge Exam: Vitals:   04/20/24 0400 04/20/24 0736  BP: 137/86 (!) 169/88  Pulse:  87  Resp: 16 (!) 24  Temp:  98 F  (36.7 C)  SpO2:  99%    General: Pt is alert, awake, not in acute distress Cardiovascular: RRR, S1/S2 +, no edema Respiratory: CTA bilaterally, no wheezing, no rhonchi, no respiratory distress, no conversational dyspnea  Abdominal: Soft, NT, ND, bowel sounds + Extremities: no edema, no cyanosis Psych: Normal mood and affect, stable judgement and insight     The results of significant diagnostics from this hospitalization (including imaging, microbiology, ancillary and laboratory) are listed below for reference.     Microbiology: Recent Results (from the past 240 hours)  Blood culture (routine x 2)     Status: None (Preliminary result)   Collection Time: 04/18/24  8:43 AM   Specimen: BLOOD  Result Value Ref Range Status   Specimen Description   Final    BLOOD RIGHT ANTECUBITAL Performed at [facility], [address] [place], [place] [address]    Special Requests   Final    BOTTLES DRAWN AEROBIC AND ANAEROBIC Blood Culture adequate volume Performed at [facility], [address] [place], [place] [address]    Culture   Final    NO GROWTH 2 DAYS Performed at [facility], [address] [place], [place] [address]    Report Status PENDING  Incomplete  Blood culture (routine x 2)     Status: None (Preliminary result)   Collection Time: 04/18/24  9:14 AM   Specimen: BLOOD  Result Value Ref Range Status   Specimen Description   Final    BLOOD LEFT ANTECUBITAL Performed at [facility], [address] [place], [place] [address]    Special Requests   Final    BOTTLES DRAWN AEROBIC AND ANAEROBIC Blood Culture results may not be optimal due to an inadequate volume of blood received in culture bottles Performed at [facility], [address] [place], [place] [address]    Culture   Final    NO GROWTH 2 DAYS Performed at [facility], [address] [place], [place] [address]    Report Status PENDING  Incomplete      Labs: BNP (last 3 results) No results for input(s): BNP in the last 8760 hours. Basic Metabolic Panel: Recent Labs  Lab 04/18/24 0843 04/18/24 0959 04/18/24 2115 04/19/24 0833 04/20/24 0525  NA 132* 129* 133* 130* 133*  K 3.9 3.4* 3.3* 4.0 3.6  CL 74*  --  88* 90* 94*  CO2 22  --  28 25 28   GLUCOSE 210*  --  149* 109* 105*  BUN 61*  --  58* 44* 25*  CREATININE 8.44*  --  4.42* 2.28* 1.29*  CALCIUM  10.4*  --  8.7* 8.8* 9.1  MG  --   --  1.7  --   --    Liver Function Tests: Recent Labs  Lab 04/18/24 0843 04/19/24 0833  AST 56* 49*  ALT 42 32  ALKPHOS 87 48  BILITOT 1.0 2.2*  PROT 10.8* 7.0  ALBUMIN 5.8* 3.7   Recent Labs  Lab 04/18/24 0843  LIPASE 35   No results for input(s): AMMONIA in the last 168 hours. CBC: Recent Labs  Lab 04/18/24 0843 04/18/24 0959 04/18/24 2115 04/19/24 0833 04/20/24 0525  WBC 34.6*  --  25.4* 16.5* 10.7*  NEUTROABS 30.5*  --   --   --   --   HGB 21.5* 20.7* 16.9 16.1 15.4  HCT 59.7* 61.0* 47.2 45.2 44.9  MCV 87.8  --  87.4 89.7 92.2  PLT 373  --  295 242 219   Cardiac Enzymes: Recent Labs  Lab 04/18/24 2115 04/19/24 1228  CKTOTAL 2,948* 1,530*   BNP: Invalid input(s): POCBNP CBG: Recent Labs  Lab 04/19/24 1206 04/19/24 1607 04/19/24 2125 04/19/24 2345 04/20/24 0733  GLUCAP 158* 100* 87 105* 151*   D-Dimer No results for input(s): DDIMER in the last 72 hours. Hgb A1c Recent Labs    04/18/24 2115  HGBA1C 5.5   Lipid Profile No results for input(s): CHOL, HDL, LDLCALC, TRIG, CHOLHDL, LDLDIRECT in the last 72 hours. Thyroid  function studies No results for input(s): TSH, T4TOTAL, T3FREE, THYROIDAB in the last 72 hours.  Invalid input(s): FREET3 Anemia work up No results for input(s): VITAMINB12, FOLATE, FERRITIN, TIBC, IRON, RETICCTPCT in the last 72 hours. Urinalysis    Component Value Date/Time   COLORURINE YELLOW 04/18/2024 1114   APPEARANCEUR CLEAR  04/18/2024 1114   APPEARANCEUR Clear 11/24/2013 1333   LABSPEC 1.025 04/18/2024 1114   LABSPEC 1.030 11/24/2013 1333   PHURINE 5.5 04/18/2024 1114   GLUCOSEU NEGATIVE 04/18/2024 1114   GLUCOSEU Negative 11/24/2013 1333   HGBUR LARGE (A) 04/18/2024 1114   BILIRUBINUR SMALL (A) 04/18/2024 1114   BILIRUBINUR 1+ 11/24/2013 1333   KETONESUR NEGATIVE 04/18/2024 1114   PROTEINUR 100 (A) 04/18/2024 1114   UROBILINOGEN 1.0 01/04/2015 1651   NITRITE NEGATIVE 04/18/2024 1114   LEUKOCYTESUR NEGATIVE 04/18/2024 1114   LEUKOCYTESUR Negative 11/24/2013 1333   Sepsis Labs Recent Labs  Lab 04/18/24 0843 04/18/24 2115 04/19/24 0833 04/20/24 0525  WBC 34.6* 25.4* 16.5* 10.7*   Microbiology Recent Results (from the past 240 hours)  Blood culture (routine x 2)     Status: None (Preliminary result)   Collection Time: 04/18/24  8:43 AM   Specimen: BLOOD  Result Value Ref Range Status   Specimen Description   Final    BLOOD RIGHT ANTECUBITAL Performed at [facility], [address] [place], [place] [address]    Special Requests   Final    BOTTLES DRAWN AEROBIC AND ANAEROBIC Blood Culture adequate volume Performed at [facility], [address] [place], [place] [address]    Culture   Final    NO GROWTH 2 DAYS Performed at [facility], [address], [place], [place] [address]    Report Status PENDING  Incomplete  Blood culture (routine x 2)     Status: None (Preliminary result)   Collection Time: 04/18/24  9:14 AM   Specimen: BLOOD  Result Value Ref Range Status   Specimen Description   Final    BLOOD LEFT ANTECUBITAL Performed at [facility], [address], [place], [place] [address]    Special  Requests   Final    BOTTLES DRAWN AEROBIC AND ANAEROBIC Blood Culture results may not be optimal due to an inadequate volume of blood received in culture bottles Performed at [facility], [address] [place], [place] [address]     Culture   Final    NO GROWTH 2 DAYS Performed at [facility], [address] [place], [place] [address]    Report Status PENDING  Incomplete     Patient was seen and examined on the day of discharge and was found to be in stable condition. Time coordinating discharge: 25 minutes including assessment and coordination of care, as well as examination of the patient.   SIGNED:  Delon Hoe, DO [facility] 04/20/2024, 8:49 AM

## 2024-04-20 NOTE — Plan of Care (Signed)

## 2024-04-23 LAB — CULTURE, BLOOD (ROUTINE X 2)
Culture: NO GROWTH
Culture: NO GROWTH
Special Requests: ADEQUATE

## 2024-05-05 ENCOUNTER — Encounter (HOSPITAL_BASED_OUTPATIENT_CLINIC_OR_DEPARTMENT_OTHER): Payer: Self-pay

## 2024-05-05 ENCOUNTER — Emergency Department (HOSPITAL_BASED_OUTPATIENT_CLINIC_OR_DEPARTMENT_OTHER)
Admission: EM | Admit: 2024-05-05 | Discharge: 2024-05-05 | Disposition: A | Discharge status: Home / Self Care | Attending: Emergency Medicine | Admitting: Emergency Medicine

## 2024-05-05 ENCOUNTER — Other Ambulatory Visit: Payer: Self-pay

## 2024-05-05 DIAGNOSIS — R112 Nausea with vomiting, unspecified: Secondary | ICD-10-CM | POA: Insufficient documentation

## 2024-05-05 DIAGNOSIS — R109 Unspecified abdominal pain: Secondary | ICD-10-CM | POA: Diagnosis not present

## 2024-05-05 DIAGNOSIS — R1013 Epigastric pain: Secondary | ICD-10-CM | POA: Diagnosis not present

## 2024-05-05 LAB — COMPREHENSIVE METABOLIC PANEL WITH GFR
ALT: 19 U/L (ref 0–44)
AST: 24 U/L (ref 15–41)
Albumin: 5.1 g/dL — ABNORMAL HIGH (ref 3.5–5.0)
Alkaline Phosphatase: 72 U/L (ref 38–126)
Anion gap: 16 — ABNORMAL HIGH (ref 5–15)
BUN: 14 mg/dL (ref 6–20)
CO2: 21 mmol/L — ABNORMAL LOW (ref 22–32)
Calcium: 10.9 mg/dL — ABNORMAL HIGH (ref 8.9–10.3)
Chloride: 100 mmol/L (ref 98–111)
Creatinine, Ser: 1.37 mg/dL — ABNORMAL HIGH (ref 0.61–1.24)
GFR, Estimated: >60 mL/min (ref >60)
Glucose, Bld: 115 mg/dL — ABNORMAL HIGH (ref 70–99)
Potassium: 4.4 mmol/L (ref 3.5–5.1)
Sodium: 137 mmol/L (ref 135–145)
Total Bilirubin: 0.5 mg/dL (ref 0.0–1.2)
Total Protein: 8.8 g/dL — ABNORMAL HIGH (ref 6.5–8.1)

## 2024-05-05 LAB — CBC
HCT: 52.2 % — ABNORMAL HIGH (ref 39.0–52.0)
Hemoglobin: 17.8 g/dL — ABNORMAL HIGH (ref 13.0–17.0)
MCH: 31.2 pg (ref 26.0–34.0)
MCHC: 34.1 g/dL (ref 30.0–36.0)
MCV: 91.6 fL (ref 80.0–100.0)
Platelets: 333 K/uL (ref 150–400)
RBC: 5.7 MIL/uL (ref 4.22–5.81)
RDW: 12.4 % (ref 11.5–15.5)
WBC: 16.4 K/uL — ABNORMAL HIGH (ref 4.0–10.5)
nRBC: 0 % (ref 0.0–0.2)

## 2024-05-05 LAB — URINALYSIS, ROUTINE W REFLEX MICROSCOPIC
Glucose, UA: NEGATIVE mg/dL
Hgb urine dipstick: NEGATIVE
Ketones, ur: NEGATIVE mg/dL
Nitrite: NEGATIVE
Protein, ur: 100 mg/dL — AB
Specific Gravity, Urine: 1.02 (ref 1.005–1.030)
pH: 7 (ref 5.0–8.0)

## 2024-05-05 LAB — URINALYSIS, MICROSCOPIC (REFLEX)

## 2024-05-05 LAB — LIPASE, BLOOD: Lipase: 26 U/L (ref 11–51)

## 2024-05-05 MED ORDER — ONDANSETRON 4 MG PO TBDP
4.0000 mg | ORAL_TABLET | Freq: Three times a day (TID) | ORAL | 0 refills | Status: DC | PRN
Start: 2024-05-05 — End: 2024-08-07

## 2024-05-05 MED ORDER — DICYCLOMINE HCL 10 MG/ML IM SOLN
20.0000 mg | Freq: Once | INTRAMUSCULAR | Status: AC
  Administered 2024-05-05: 20 mg via INTRAMUSCULAR
  Filled 2024-05-05: qty 2

## 2024-05-05 MED ORDER — MORPHINE SULFATE (PF) 4 MG/ML IV SOLN
4.0000 mg | Freq: Once | INTRAVENOUS | Status: AC
  Administered 2024-05-05: 4 mg via INTRAVENOUS
  Filled 2024-05-05: qty 1

## 2024-05-05 MED ORDER — SODIUM CHLORIDE 0.9 % IV BOLUS
1000.0000 mL | Freq: Once | INTRAVENOUS | Status: AC
  Administered 2024-05-05: 1000 mL via INTRAVENOUS

## 2024-05-05 MED ORDER — ONDANSETRON HCL 4 MG/2ML IJ SOLN
4.0000 mg | Freq: Once | INTRAMUSCULAR | Status: AC
  Administered 2024-05-05: 4 mg via INTRAVENOUS
  Filled 2024-05-05: qty 2

## 2024-05-05 NOTE — Discharge Instructions (Addendum)
 It was a pleasure taking care of you today.  As discussed, your workup is reassuring.  I am sending you home with nausea medication.  Take as needed for nausea.  Return to the ER for any worsening symptoms.

## 2024-05-05 NOTE — ED Notes (Signed)
 Pt asked for abd pain medicine, EDP updated.

## 2024-05-05 NOTE — ED Notes (Signed)

## 2024-05-05 NOTE — ED Provider Notes (Signed)
 [facility] Provider Note   CSN: [id number] Arrival date & time: 05/05/24  1330     Patient presents with: Abdominal Pain and Flank Pain   Carlos Guzman is a 38 y.o. male with a past medical history significant for polysubstance abuse, cannabinoid hyperemesis syndrome, and HIV who presents to the ED due to nausea and vomiting for the past few days.  Patient admits to numerous episodes of nonbloody, nonbilious emesis.  Recently admitted to the hospital for the same.  Admits to smoking marijuana.  Also has a history of alcohol abuse however, notes he has not had any alcohol since his recent admission on 8/10.  No fever or chills.  Admits to bilateral flank pain.  No urinary symptoms.  Denies diarrhea.  History obtained from patient and past medical records. No interpreter used during encounter.       Prior to Admission medications   Medication Sig Start Date End Date Taking? Authorizing Provider  ondansetron  (ZOFRAN -ODT) 4 MG disintegrating tablet Take 1 tablet (4 mg total) by mouth every 8 (eight) hours as needed for nausea or vomiting. 05/05/24  Yes Kerri Asche, Aleck BROCKS, PA-C  amLODipine  (NORVASC ) 5 MG tablet Take 1 tablet (5 mg total) by mouth daily. Patient not taking: Reported on 07/18/2022 05/08/22   Ruthell Lonni FALCON, PA-C  DOVATO  50-300 MG tablet Take 1 tablet by mouth daily. 07/16/21   [provider]  metoCLOPramide  (REGLAN ) 10 MG tablet Take 1 tablet (10 mg total) by mouth 3 (three) times daily with meals. 01/15/23 01/15/24  Flint Sonny POUR, PA-C  omeprazole  (PRILOSEC) 40 MG capsule Take 40 mg by mouth daily.    [provider]  ondansetron  (ZOFRAN ) 4 MG tablet Take 1 tablet (4 mg total) by mouth every 6 (six) hours. Patient not taking: Reported on 04/18/2024 12/08/23   Donnajean Lynwood DEL, PA-C  promethazine  (PHENERGAN ) 25 MG suppository Place 1 suppository (25 mg total) rectally every 6 (six) hours as needed for nausea or  vomiting. Patient not taking: Reported on 08/23/2022 08/18/22   Beverley Doffing A, PA-C  sucralfate  (CARAFATE ) 1 g tablet Take 1 tablet (1 g total) by mouth 4 (four) times daily -  with meals and at bedtime. 06/10/19 06/10/19  Molpus, John, MD    Allergies: Droperidol     Review of Systems  Constitutional:  Negative for chills and fever.  Respiratory:  Negative for shortness of breath.   Cardiovascular:  Negative for chest pain.  Gastrointestinal:  Positive for abdominal pain, nausea and vomiting. Negative for diarrhea.  Genitourinary:  Negative for dysuria.    Updated Vital Signs BP (!) 149/102 (BP Location: Left Arm)   Pulse 84   Temp (!) 97.3 F (36.3 C) (Oral)   Resp 18   Wt 79.4 kg   SpO2 100%   BMI 23.09 kg/m   Physical Exam Vitals and nursing note reviewed.  Constitutional:      General: He is not in acute distress.    Appearance: He is not ill-appearing.  HENT:     Head: Normocephalic.  Eyes:     Pupils: Pupils are equal, round, and reactive to light.  Cardiovascular:     Rate and Rhythm: Normal rate and regular rhythm.     Pulses: Normal pulses.     Heart sounds: Normal heart sounds. No murmur heard.    No friction rub. No gallop.  Pulmonary:     Effort: Pulmonary effort is normal.     Breath  sounds: Normal breath sounds.  Abdominal:     General: Abdomen is flat. There is no distension.     Palpations: Abdomen is soft.     Tenderness: There is abdominal tenderness. There is no guarding or rebound.  Musculoskeletal:        General: Normal range of motion.     Cervical back: Neck supple.  Skin:    General: Skin is warm and dry.  Neurological:     General: No focal deficit present.     Mental Status: He is alert.  Psychiatric:        Mood and Affect: Mood normal.        Behavior: Behavior normal.     (all labs ordered are listed, but only abnormal results are displayed) Labs Reviewed  COMPREHENSIVE METABOLIC PANEL WITH GFR - Abnormal; Notable for the  following components:      Result Value   CO2 21 (*)    Glucose, Bld 115 (*)    Creatinine, Ser 1.37 (*)    Calcium  10.9 (*)    Total Protein 8.8 (*)    Albumin 5.1 (*)    Anion gap 16 (*)    All other components within normal limits  CBC - Abnormal; Notable for the following components:   WBC 16.4 (*)    Hemoglobin 17.8 (*)    HCT 52.2 (*)    All other components within normal limits  URINALYSIS, ROUTINE W REFLEX MICROSCOPIC - Abnormal; Notable for the following components:   Color, Urine AMBER (*)    Bilirubin Urine SMALL (*)    Protein, ur 100 (*)    Leukocytes,Ua SMALL (*)    All other components within normal limits  URINALYSIS, MICROSCOPIC (REFLEX) - Abnormal; Notable for the following components:   Bacteria, UA FEW (*)    All other components within normal limits  LIPASE, BLOOD    EKG: None  Radiology: No results found.   Procedures   Medications Ordered in the ED  sodium chloride  0.9 % bolus 1,000 mL (0 mLs Intravenous Stopped 05/05/24 1525)  ondansetron  (ZOFRAN ) injection 4 mg (4 mg Intravenous Given 05/05/24 1424)  morphine  (PF) 4 MG/ML injection 4 mg (4 mg Intravenous Given 05/05/24 1429)  sodium chloride  0.9 % bolus 1,000 mL (0 mLs Intravenous Stopped 05/05/24 1700)  dicyclomine  (BENTYL ) injection 20 mg (20 mg Intramuscular Given 05/05/24 1558)    Clinical Course as of 05/05/24 1746  Wed May 05, 2024  1402 WBC(!): 16.4 [CA]  1520 Reassessed patient at bedside.  Patient notes improvement in symptoms.  Patient resting in bed.  Will give another liter IV fluids. [CA]  1737 Bacteria, UA(!): FEW [CA]  [address]): SMALL [CA]  1740 Reassessed patient.  Patient admits to significant improvement.  Is ready to be discharged.  Will p.o. challenge and discharge. [CA]    Clinical Course User Index [CA] Lorelle Aleck BROCKS, PA-C                                 Medical Decision Making Amount and/or Complexity of Data Reviewed External Data Reviewed:  notes.    Details: Recent admission notes Labs: ordered. Decision-making details documented in ED Course. ECG/medicine tests: ordered and independent interpretation performed. Decision-making details documented in ED Course.  Risk Prescription drug management.   This patient presents to the ED for concern of N/V this involves an extensive number of treatment options, and is a  complaint that carries with it a high risk of complications and morbidity.  The differential diagnosis includes cannabinoid hyperemesis syndrome, gastroenteritis, bowel obstruction, pancreatitis, etc  38 year old male presents to the ED due to nausea and vomiting for the past few days.  Recently admitted to the hospital on 8/10 to 8/12 for nausea and vomiting in the setting of hyperemesis syndrome. CT unremarkable then. No fever or chills.  Does endorse bilateral flank pain.  Upon arrival patient afebrile, not tachycardic or hypoxic.  Patient actively vomiting during initial evaluation.  Diffuse abdominal tenderness.  Routine labs ordered.  IV fluids and Zofran  given.  Will hold off on CT abdomen at this time given patient had 1 during his most recent admission which was unremarkable.  Suspect symptoms likely related to hyperemesis syndrome.  CBC significant for leukocytosis at 16.4 likely reactive.  CMP with elevated creatinine 1.37.  Normal BUN.  Does have an anion gap of 16 likely due to starvation ketosis. IVFs given.  Lipase normal.  Low suspicion for pancreatitis.  UA with small leukocytes and few bacteria.  No urinary symptoms.  Low suspicion for UTI.  Does demonstrate proteinuria likely due to dehydration from vomiting.  EKG normal sinus rhythm.  No signs of acute ischemia.  Low suspicion for ACS.  Upon reassessment, abdomen soft, nondistended, nontender.  Low suspicion for acute abdomen.  Patient able to tolerate p.o. without difficulty.  Suspect symptoms likely related to cannabinoid hyperemesis syndrome.  Patient  request to be discharged.  Will discharge patient with Zofran .  Patient stable for discharge. Strict ED precautions discussed with patient. Patient states understanding and agrees to plan. Patient discharged home in no acute distress and stable vitals  Co morbidities that complicate the patient evaluation  HIV Cardiac Monitoring: / EKG:  The patient was maintained on a cardiac monitor.  I personally viewed and interpreted the cardiac monitored which showed an underlying rhythm of: NSR  Social Determinants of Health:  Polysubstance abuse  Test / Admission - Considered:  Patient does not require admission, able to tolerate po.      Final diagnoses:  Nausea and vomiting, unspecified vomiting type    ED Discharge Orders          Ordered    ondansetron  (ZOFRAN -ODT) 4 MG disintegrating tablet  Every 8 hours PRN        05/05/24 1745               Patricia Fargo C, PA-C 05/05/24 1746    Freddi Hamilton, MD 05/06/24 [phone]

## 2024-05-05 NOTE — ED Notes (Signed)
 Pt given ice water for PO challenge.

## 2024-05-05 NOTE — ED Triage Notes (Signed)
 Generalized abd pain, BIL flank pain for 2 days. NV.  Denies diarrhea, constipation, urinary symptoms

## 2024-08-07 ENCOUNTER — Encounter (HOSPITAL_BASED_OUTPATIENT_CLINIC_OR_DEPARTMENT_OTHER): Payer: Self-pay | Admitting: Emergency Medicine

## 2024-08-07 ENCOUNTER — Emergency Department (HOSPITAL_BASED_OUTPATIENT_CLINIC_OR_DEPARTMENT_OTHER): Payer: Self-pay

## 2024-08-07 ENCOUNTER — Emergency Department (HOSPITAL_BASED_OUTPATIENT_CLINIC_OR_DEPARTMENT_OTHER)
Admission: EM | Admit: 2024-08-07 | Discharge: 2024-08-07 | Disposition: A | Discharge status: Home / Self Care | Payer: Self-pay | Attending: Emergency Medicine | Admitting: Emergency Medicine

## 2024-08-07 DIAGNOSIS — N3000 Acute cystitis without hematuria: Secondary | ICD-10-CM | POA: Insufficient documentation

## 2024-08-07 DIAGNOSIS — R112 Nausea with vomiting, unspecified: Secondary | ICD-10-CM

## 2024-08-07 DIAGNOSIS — N183 Chronic kidney disease, stage 3 unspecified: Secondary | ICD-10-CM | POA: Insufficient documentation

## 2024-08-07 DIAGNOSIS — F17210 Nicotine dependence, cigarettes, uncomplicated: Secondary | ICD-10-CM | POA: Insufficient documentation

## 2024-08-07 DIAGNOSIS — Z21 Asymptomatic human immunodeficiency virus [HIV] infection status: Secondary | ICD-10-CM | POA: Insufficient documentation

## 2024-08-07 DIAGNOSIS — R1116 Cannabis hyperemesis syndrome: Secondary | ICD-10-CM | POA: Insufficient documentation

## 2024-08-07 DIAGNOSIS — J45909 Unspecified asthma, uncomplicated: Secondary | ICD-10-CM | POA: Insufficient documentation

## 2024-08-07 LAB — URINALYSIS, ROUTINE W REFLEX MICROSCOPIC
Glucose, UA: NEGATIVE mg/dL
Ketones, ur: 15 mg/dL — AB
Leukocytes,Ua: NEGATIVE
Nitrite: NEGATIVE
Protein, ur: >=300 mg/dL — AB
Specific Gravity, Urine: >=1.03 (ref 1.005–1.030)
pH: 5.5 (ref 5.0–8.0)

## 2024-08-07 LAB — CBC WITH DIFFERENTIAL/PLATELET
Abs Immature Granulocytes: 0.13 K/uL — ABNORMAL HIGH (ref 0.00–0.07)
Basophils Absolute: 0 K/uL (ref 0.0–0.1)
Basophils Relative: 0 %
Eosinophils Absolute: 0.2 K/uL (ref 0.0–0.5)
Eosinophils Relative: 1 %
HCT: 48.8 % (ref 39.0–52.0)
Hemoglobin: 16.7 g/dL (ref 13.0–17.0)
Immature Granulocytes: 1 %
Lymphocytes Relative: 14 %
Lymphs Abs: 2.1 K/uL (ref 0.7–4.0)
MCH: 30.8 pg (ref 26.0–34.0)
MCHC: 34.2 g/dL (ref 30.0–36.0)
MCV: 89.9 fL (ref 80.0–100.0)
Monocytes Absolute: 0.6 K/uL (ref 0.1–1.0)
Monocytes Relative: 4 %
Neutro Abs: 12.5 K/uL — ABNORMAL HIGH (ref 1.7–7.7)
Neutrophils Relative %: 80 %
Platelets: 255 K/uL (ref 150–400)
RBC: 5.43 MIL/uL (ref 4.22–5.81)
RDW: 13.6 % (ref 11.5–15.5)
WBC: 15.6 K/uL — ABNORMAL HIGH (ref 4.0–10.5)
nRBC: 0 % (ref 0.0–0.2)

## 2024-08-07 LAB — COMPREHENSIVE METABOLIC PANEL WITH GFR
ALT: 180 U/L — ABNORMAL HIGH (ref 0–44)
AST: 105 U/L — ABNORMAL HIGH (ref 15–41)
Albumin: 5.2 g/dL — ABNORMAL HIGH (ref 3.5–5.0)
Alkaline Phosphatase: 72 U/L (ref 38–126)
Anion gap: 19 — ABNORMAL HIGH (ref 5–15)
BUN: 20 mg/dL (ref 6–20)
CO2: 21 mmol/L — ABNORMAL LOW (ref 22–32)
Calcium: 10.6 mg/dL — ABNORMAL HIGH (ref 8.9–10.3)
Chloride: 101 mmol/L (ref 98–111)
Creatinine, Ser: 1.99 mg/dL — ABNORMAL HIGH (ref 0.61–1.24)
GFR, Estimated: 43 mL/min — ABNORMAL LOW (ref >60)
Glucose, Bld: 157 mg/dL — ABNORMAL HIGH (ref 70–99)
Potassium: 4.1 mmol/L (ref 3.5–5.1)
Sodium: 140 mmol/L (ref 135–145)
Total Bilirubin: 0.3 mg/dL (ref 0.0–1.2)
Total Protein: 9.6 g/dL — ABNORMAL HIGH (ref 6.5–8.1)

## 2024-08-07 LAB — URINALYSIS, MICROSCOPIC (REFLEX)

## 2024-08-07 LAB — LIPASE, BLOOD: Lipase: 22 U/L (ref 11–51)

## 2024-08-07 MED ORDER — SODIUM CHLORIDE 0.9 % IV BOLUS: 2000.0000 mL | Freq: Once | INTRAVENOUS | Status: DC

## 2024-08-07 MED ORDER — DROPERIDOL 2.5 MG/ML IJ SOLN
2.5000 mg | Freq: Once | INTRAMUSCULAR | Status: AC
  Administered 2024-08-07: 2.5 mg via INTRAVENOUS
  Filled 2024-08-07: qty 2

## 2024-08-07 MED ORDER — SODIUM CHLORIDE 0.9 % IV BOLUS
1000.0000 mL | Freq: Once | INTRAVENOUS | Status: AC
  Administered 2024-08-07: 1000 mL via INTRAVENOUS

## 2024-08-07 MED ORDER — ZIKS ARTHRITIS PAIN RELIEF 0.025-1-12 % EX CREA: 1.0000 | TOPICAL_CREAM | Freq: Three times a day (TID) | CUTANEOUS | 0 refills | Status: AC | PRN

## 2024-08-07 MED ORDER — CEFUROXIME AXETIL 500 MG PO TABS: 500.0000 mg | ORAL_TABLET | Freq: Two times a day (BID) | ORAL | 0 refills | Status: AC

## 2024-08-07 MED ORDER — ONDANSETRON 4 MG PO TBDP: 4.0000 mg | ORAL_TABLET | Freq: Three times a day (TID) | ORAL | 0 refills | Status: DC | PRN

## 2024-08-07 MED ORDER — DIPHENHYDRAMINE HCL 50 MG/ML IJ SOLN
50.0000 mg | Freq: Once | INTRAMUSCULAR | Status: AC
  Administered 2024-08-07: 50 mg via INTRAVENOUS
  Filled 2024-08-07: qty 1

## 2024-08-07 MED ORDER — IOHEXOL 300 MG/ML  SOLN
80.0000 mL | Freq: Once | INTRAMUSCULAR | Status: AC | PRN
  Administered 2024-08-07: 80 mL via INTRAVENOUS

## 2024-08-07 MED ORDER — SODIUM CHLORIDE 0.9 % IV SOLN
2.0000 g | Freq: Once | INTRAVENOUS | Status: AC
  Administered 2024-08-07: 2 g via INTRAVENOUS
  Filled 2024-08-07: qty 20

## 2024-08-07 MED ORDER — PROMETHAZINE HCL 25 MG RE SUPP: 25.0000 mg | Freq: Four times a day (QID) | RECTAL | 0 refills | Status: DC | PRN

## 2024-08-07 MED ORDER — SODIUM CHLORIDE 0.9 % IV BOLUS: 1000.0000 mL | Freq: Once | INTRAVENOUS | Status: DC

## 2024-08-07 NOTE — ED Notes (Signed)
 Patient transported to CT

## 2024-08-07 NOTE — Discharge Instructions (Signed)
 It was a pleasure caring for you today in the emergency department.  Recommend you follow a very bland diet for the next 2 weeks, drink plenty of clear liquids.  Avoid NSAIDs, spicy or fried food, alcohol, tobacco, THC.  Recommend complete abstinence from [facility].   Please return to the emergency department for any worsening or worrisome symptoms.

## 2024-08-07 NOTE — ED Provider Notes (Signed)
 [facility] Provider Note  CSN: [id number] Arrival date & time: 08/07/24 1105  Chief Complaint(s) Emesis  HPI Carlos Guzman is a 38 y.o. male with past medical history as below, significant for asthma, cannabinoid hyperemesis syndrome, HIV, pancreatitis, substance abuse including cocaine, THC who presents to the ED with complaint of abd pain nausea vomiting  Patient with history of the same.  Reports last THC use was this morning.  Reports he smokes THC to increase his appetite.  Generalized abdominal pain, nausea vomiting over the past 24 hours.  Feels similar to prior episodes.  Denies any changes to his urination.  Does report diarrhea yesterday has since improved.  Emesis nonbloody nonbilious.  No recent travel or sick contacts, no fevers.  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   Acute kidney failure 10/24/2021   Intractable nausea and vomiting 09/06/2021   CKD (chronic kidney disease), stage III (HCC) 09/06/2021   Prolonged QT interval 09/06/2021   Cocaine abuse (HCC) 09/06/2021   ARF (acute renal failure) 07/29/2019   Hyperkalemia 07/29/2019   Left upper quadrant abdominal pain 07/29/2019   Metabolic acidosis, increased anion gap 07/29/2019   Hyperbilirubinemia 07/29/2019   Hyponatremia 07/29/2019   Rhabdomyolysis 07/24/2019   AKI (acute kidney injury) 07/23/2019   HIV (human immunodeficiency virus infection) (HCC) 07/23/2019   Cannabinoid hyperemesis syndrome 07/23/2019   Hypokalemia 07/23/2019   Leukocytosis 07/23/2019   Tobacco abuse 07/23/2019   Alcohol abuse, 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 Authorizing Provider  Capsaicin -Menthol-Methyl Sal (CAPSAICIN -METHYL SAL-MENTHOL) 0.025-1-12 % CREA Apply 1 Application topically 3 (three) times daily as needed. 08/07/24  Yes Elnor Savant A, DO  cefUROXime (CEFTIN) 500 MG tablet Take 1 tablet (500 mg total) by mouth 2 (two) times daily with a meal for 7 days. 08/07/24 08/14/24 Yes Elnor Savant LABOR, DO  promethazine  (PHENERGAN ) 25 MG suppository Place 1 suppository (25 mg total) rectally every 6 (six) hours as needed for nausea or vomiting. 08/07/24  Yes Elnor Savant A, DO  amLODipine  (NORVASC ) 5 MG tablet Take 1 tablet (5 mg total) by mouth daily. Patient not taking: Reported on 07/18/2022 05/08/22   Ruthell Lonni FALCON, PA-C  DOVATO  50-300 MG tablet Take 1 tablet by mouth daily. 07/16/21   [provider]  metoCLOPramide  (REGLAN ) 10 MG tablet Take 1 tablet (10 mg total) by mouth 3 (three) times daily with meals. 01/15/23 01/15/24  Flint Sonny POUR, PA-C  omeprazole  (PRILOSEC) 40 MG capsule Take 40 mg by mouth daily.    [provider]  ondansetron  (ZOFRAN -ODT) 4 MG disintegrating tablet Take 1 tablet (4 mg total) by mouth every 8 (eight) hours as needed for nausea or vomiting. 08/07/24   Elnor Savant LABOR, DO  sucralfate  (CARAFATE ) 1 g tablet Take 1 tablet (1 g total) by mouth 4 (four) times daily -  with meals and at bedtime. 06/10/19 06/10/19  Molpus, Norleen, MD  Past Surgical History Past Surgical History:  Procedure Laterality Date   NO PAST SURGERIES     Family History Family History  Problem Relation Age of Onset   CAD Neg Hx    Diabetes Neg Hx     Social History Social History   Tobacco Use   Smoking status: Every Day    Current packs/day: 1.00    Types: Cigarettes   Smokeless tobacco: Never  Vaping Use    Vaping status: Never Used  Substance Use Topics   Alcohol use: Yes    Comment: daily   Drug use: Yes    Types: Marijuana, Cocaine   Allergies Droperidol   Review of Systems A thorough review of systems was obtained and all systems are negative except as noted in the HPI and PMH.   Physical Exam Vital Signs  I have reviewed the triage vital signs BP (!) 167/116   Pulse 84   Temp 98.1 F (36.7 C) (Oral)   Resp 18   Ht 6' 1 (1.854 m)   Wt 79.4 kg   SpO2 100%   BMI 23.09 kg/m  Physical Exam Vitals and nursing note reviewed.  Constitutional:      General: He is not in acute distress.    Appearance: Normal appearance. He is well-developed. He is not ill-appearing.  HENT:     Head: Normocephalic and atraumatic.     Right Ear: External ear normal.     Left Ear: External ear normal.     Nose: Nose normal.     Mouth/Throat:     Mouth: Mucous membranes are moist.  Eyes:     General: No scleral icterus.       Right eye: No discharge.        Left eye: No discharge.  Cardiovascular:     Rate and Rhythm: Normal rate.  Pulmonary:     Effort: Pulmonary effort is normal. No respiratory distress.     Breath sounds: No stridor.  Abdominal:     General: Abdomen is flat. There is no distension.     Palpations: Abdomen is soft.     Tenderness: There is generalized abdominal tenderness. There is no guarding.  Musculoskeletal:        General: No deformity.     Cervical back: No rigidity.  Skin:    General: Skin is warm and dry.     Coloration: Skin is not cyanotic, jaundiced or pale.  Neurological:     Mental Status: He is alert.  Psychiatric:        Speech: Speech normal.        Behavior: Behavior normal. Behavior is cooperative.     ED Results and Treatments Labs (all labs ordered are listed, but only abnormal results are displayed) Labs Reviewed  COMPREHENSIVE METABOLIC PANEL WITH GFR - Abnormal; Notable for the following components:      Result Value   CO2 21 (*)     Glucose, Bld 157 (*)    Creatinine, Ser 1.99 (*)    Calcium  10.6 (*)    Total Protein 9.6 (*)    Albumin 5.2 (*)    AST 105 (*)    ALT 180 (*)    GFR, Estimated 43 (*)    Anion gap 19 (*)    All other components within normal limits  URINALYSIS, ROUTINE W REFLEX MICROSCOPIC - Abnormal; Notable for the following components:   APPearance HAZY (*)    Hgb urine dipstick SMALL (*)    Bilirubin  Urine MODERATE (*)    Ketones, ur 15 (*)    Protein, ur >=300 (*)    All other components within normal limits  CBC WITH DIFFERENTIAL/PLATELET - Abnormal; Notable for the following components:   WBC 15.6 (*)    Neutro Abs 12.5 (*)    Abs Immature Granulocytes 0.13 (*)    All other components within normal limits  URINALYSIS, MICROSCOPIC (REFLEX) - Abnormal; Notable for the following components:   Bacteria, UA MANY (*)    All other components within normal limits  URINE CULTURE  LIPASE, BLOOD                                                                                                                          Radiology CT ABDOMEN PELVIS W CONTRAST Result Date: 08/07/2024 CLINICAL DATA:  Generalized abdominal pain.  Vomiting for 1 day. EXAM: CT ABDOMEN AND PELVIS WITH CONTRAST TECHNIQUE: Multidetector CT imaging of the abdomen and pelvis was performed using the standard protocol following bolus administration of intravenous contrast. RADIATION DOSE REDUCTION: This exam was performed according to the departmental dose-optimization program which includes automated exposure control, adjustment of the mA and/or kV according to patient size and/or use of iterative reconstruction technique. CONTRAST:  80mL OMNIPAQUE  IOHEXOL  300 MG/ML  SOLN COMPARISON:  04/18/2024. FINDINGS: Lower chest: Clear lung bases. Hepatobiliary: No focal liver abnormality is seen. No gallstones, gallbladder wall thickening, or biliary dilatation. Pancreas: Unremarkable. No pancreatic ductal dilatation or surrounding inflammatory  changes. Spleen: Normal in size without focal abnormality. Adrenals/Urinary Tract: Adrenal glands are unremarkable. Kidneys are normal, without renal calculi, focal lesion, or hydronephrosis. Bladder is unremarkable. Stomach/Bowel: Stomach is within normal limits. Appendix appears normal. No evidence of bowel wall thickening, distention, or inflammatory changes. Vascular/Lymphatic: No significant vascular findings are present. No enlarged abdominal or pelvic lymph nodes. Reproductive: Unremarkable. Other: None. Musculoskeletal: Normal. IMPRESSION: 1. Normal enhanced CT scan of the abdomen and pelvis. Electronically Signed   By: Alm Parkins M.D.   On: 08/07/2024 13:09    Pertinent labs & imaging results that were available during my care of the patient were reviewed by me and considered in my medical decision making (see MDM for details).  Medications Ordered in ED Medications  droperidol  (INAPSINE ) 2.5 MG/ML injection 2.5 mg (2.5 mg Intravenous Given 08/07/24 1226)  diphenhydrAMINE  (BENADRYL ) injection 50 mg (50 mg Intravenous Given 08/07/24 1225)  sodium chloride  0.9 % bolus 1,000 mL (0 mLs Intravenous Stopped 08/07/24 1430)  iohexol  (OMNIPAQUE ) 300 MG/ML solution 80 mL (80 mLs Intravenous Contrast Given 08/07/24 1235)  cefTRIAXone  (ROCEPHIN ) 2 g in sodium chloride  0.9 % 100 mL IVPB (0 g Intravenous Stopped 08/07/24 1430)  Procedures Procedures  (including critical care time)  Medical Decision Making / ED Course    Medical Decision Making:    Carlos Guzman is a 38 y.o. male with past medical history as below, significant for asthma, cannabinoid hyperemesis syndrome, HIV, pancreatitis, substance abuse including cocaine, THC who presents to the ED with complaint of abd pain nausea vomiting. The complaint involves an extensive differential diagnosis and also  carries with it a high risk of complications and morbidity.  Serious etiology was considered. Ddx includes but is not limited to: Differential diagnosis includes but is not exclusive to acute cholecystitis, intrathoracic causes for epigastric abdominal pain, gastritis, duodenitis, pancreatitis, small bowel or large bowel obstruction, abdominal aortic aneurysm, hernia, gastritis, etc.   Complete initial physical exam performed, notably the patient was in no acute distress, appears uncomfortable.    Reviewed and confirmed nursing documentation for past medical history, family history, social history.  Vital signs reviewed.    Generalized abdominal pain Nausea/vomiting Cannabinoid hyperemesis syndrome Cystitis > - History of CHS, last THC use was this morning. -Prior EKG reviewed, ordered droperidol /Benadryl /fluids - Labs do show elevated creatinine; history of renal insufficiency.  He has leukocytosis, no fever, favor demargination in setting of vomiting. - given ua c/w cystitis in conjunction with his n/v and dysuria/flank pain will treat for pyelonephritis. He is tolerating PO w/o difficulty. His CHS is likely a factor in his n/v as well.  - CT is stable - feeling much better  Clinical Course as of 08/07/24 1502  Sat Aug 07, 2024  1318 UA concerning for infection, he does report some increased frequency, but no dysuria. Has left sided flank pain. Send urine culture and give dose rocephin . [SG]  1434 He is feeling better, attempting PO challenge [SG]    Clinical Course User Index [SG] Elnor Jayson LABOR, DO     3:02 PM:  I have discussed the diagnosis/risks/treatment options with the patient.  Evaluation and diagnostic testing in the emergency department does not suggest an emergent condition requiring admission or immediate intervention beyond what has been performed at this time.  They will follow up with PCP. We also discussed returning to the ED immediately if new or worsening sx occur. We  discussed the sx which are most concerning (e.g., sudden worsening pain, fever, inability to tolerate by mouth) that necessitate immediate return.    The patient appears reasonably screened and/or stabilized for discharge and I doubt any other medical condition or other [facility] requiring further screening, evaluation, or treatment in the ED at this time prior to discharge.                 Additional history obtained: -Additional history obtained from na -External records from outside source obtained and reviewed including: Chart review including previous notes, labs, imaging, consultation notes including  Prior er eval Home medications  Allergies    Lab Tests: -I ordered, reviewed, and interpreted labs.   The pertinent results include:   Labs Reviewed  COMPREHENSIVE METABOLIC PANEL WITH GFR - Abnormal; Notable for the following components:      Result Value   CO2 21 (*)    Glucose, Bld 157 (*)    Creatinine, Ser 1.99 (*)    Calcium  10.6 (*)    Total Protein 9.6 (*)    Albumin 5.2 (*)    AST 105 (*)    ALT 180 (*)    GFR, Estimated 43 (*)    Anion gap 19 (*)    All  other components within normal limits  URINALYSIS, ROUTINE W REFLEX MICROSCOPIC - Abnormal; Notable for the following components:   APPearance HAZY (*)    Hgb urine dipstick SMALL (*)    Bilirubin Urine MODERATE (*)    Ketones, ur 15 (*)    Protein, ur >=300 (*)    All other components within normal limits  CBC WITH DIFFERENTIAL/PLATELET - Abnormal; Notable for the following components:   WBC 15.6 (*)    Neutro Abs 12.5 (*)    Abs Immature Granulocytes 0.13 (*)    All other components within normal limits  URINALYSIS, MICROSCOPIC (REFLEX) - Abnormal; Notable for the following components:   Bacteria, UA MANY (*)    All other components within normal limits  URINE CULTURE  LIPASE, BLOOD    Notable for as above  EKG   EKG Interpretation Date/Time:    Ventricular Rate:    PR Interval:    QRS  Duration:    QT Interval:    QTC Calculation:   R Axis:      Text Interpretation:           Imaging Studies ordered: I ordered imaging studies including CTAP I independently visualized the following imaging with scope of interpretation limited to determining acute life threatening conditions related to emergency care; findings noted above I agree with the radiologist interpretation If any imaging was obtained with contrast I closely monitored patient for any possible adverse reaction a/w contrast administration in the emergency department   Medicines ordered and prescription drug management: Meds ordered this encounter  Medications   droperidol  (INAPSINE ) 2.5 MG/ML injection 2.5 mg   diphenhydrAMINE  (BENADRYL ) injection 50 mg   DISCONTD: sodium chloride  0.9 % bolus 1,000 mL   DISCONTD: sodium chloride  0.9 % bolus 2,000 mL   sodium chloride  0.9 % bolus 1,000 mL   iohexol  (OMNIPAQUE ) 300 MG/ML solution 80 mL   cefTRIAXone (ROCEPHIN) 2 g in sodium chloride  0.9 % 100 mL IVPB    Antibiotic Indication::   Other Indication (list below)    Other Indication::   pyelonephritis   Capsaicin -Menthol-Methyl Sal (CAPSAICIN -METHYL SAL-MENTHOL) 0.025-1-12 % CREA    Sig: Apply 1 Application topically 3 (three) times daily as needed.    Dispense:  56.6 g    Refill:  0   promethazine  (PHENERGAN ) 25 MG suppository    Sig: Place 1 suppository (25 mg total) rectally every 6 (six) hours as needed for nausea or vomiting.    Dispense:  12 each    Refill:  0   cefUROXime (CEFTIN) 500 MG tablet    Sig: Take 1 tablet (500 mg total) by mouth 2 (two) times daily with a meal for 7 days.    Dispense:  14 tablet    Refill:  0   ondansetron  (ZOFRAN -ODT) 4 MG disintegrating tablet    Sig: Take 1 tablet (4 mg total) by mouth every 8 (eight) hours as needed for nausea or vomiting.    Dispense:  20 tablet    Refill:  0    -I have reviewed the patients home medicines and have made adjustments as  needed   Consultations Obtained: na   Cardiac Monitoring: The patient was maintained on a cardiac monitor.  I personally viewed and interpreted the cardiac monitored which showed an underlying rhythm of: NSR Continuous pulse oximetry interpreted by myself, 100% on RA.    Social Determinants of Health:  Diagnosis or treatment significantly limited by social determinants of health: THC use > strongly  encouraged cessation   Reevaluation: After the interventions noted above, I reevaluated the patient and found that they have improved  Co morbidities that complicate the patient evaluation  Past Medical History:  Diagnosis Date   Asthma    Cannabinoid hyperemesis syndrome    HIV (human immunodeficiency virus infection) (HCC)    Pancreatitis    Substance abuse (HCC)       Dispostion: Disposition decision including need for hospitalization was considered, and patient discharged from emergency department.    Final Clinical Impression(s) / ED Diagnoses Final diagnoses:  Acute cystitis without hematuria  Nausea and vomiting, unspecified vomiting type  Cannabinoid hyperemesis syndrome        Elnor Jayson LABOR, DO 08/07/24 1502

## 2024-08-07 NOTE — ED Notes (Signed)
 ED Provider at bedside.

## 2024-08-07 NOTE — ED Triage Notes (Signed)
 Pt reports vomiting x 1d; sts he has a hx of cannabinoid hyperemesis; generalized abd pain

## 2024-08-08 LAB — URINE CULTURE: Culture: NO GROWTH

## 2024-08-16 ENCOUNTER — Emergency Department (HOSPITAL_BASED_OUTPATIENT_CLINIC_OR_DEPARTMENT_OTHER)
Admission: EM | Admit: 2024-08-16 | Discharge: 2024-08-16 | Disposition: A | Discharge status: Home / Self Care | Payer: Self-pay | Attending: Emergency Medicine | Admitting: Emergency Medicine

## 2024-08-16 ENCOUNTER — Other Ambulatory Visit: Payer: Self-pay

## 2024-08-16 DIAGNOSIS — N179 Acute kidney failure, unspecified: Secondary | ICD-10-CM | POA: Diagnosis not present

## 2024-08-16 DIAGNOSIS — R112 Nausea with vomiting, unspecified: Secondary | ICD-10-CM | POA: Diagnosis present

## 2024-08-16 DIAGNOSIS — E876 Hypokalemia: Secondary | ICD-10-CM | POA: Diagnosis not present

## 2024-08-16 DIAGNOSIS — E871 Hypo-osmolality and hyponatremia: Secondary | ICD-10-CM

## 2024-08-16 DIAGNOSIS — M545 Low back pain, unspecified: Secondary | ICD-10-CM | POA: Diagnosis not present

## 2024-08-16 LAB — URINE DRUG SCREEN
Amphetamines: NEGATIVE
Barbiturates: NEGATIVE
Benzodiazepines: NEGATIVE
Cocaine: POSITIVE — AB
Fentanyl: NEGATIVE
Methadone Scn, Ur: NEGATIVE
Opiates: NEGATIVE
Tetrahydrocannabinol: POSITIVE — AB

## 2024-08-16 LAB — URINALYSIS, MICROSCOPIC (REFLEX)

## 2024-08-16 LAB — COMPREHENSIVE METABOLIC PANEL WITH GFR
ALT: 39 U/L (ref 0–44)
AST: 39 U/L (ref 15–41)
Albumin: 5.1 g/dL — ABNORMAL HIGH (ref 3.5–5.0)
Alkaline Phosphatase: 75 U/L (ref 38–126)
Anion gap: 17 — ABNORMAL HIGH (ref 5–15)
BUN: 44 mg/dL — ABNORMAL HIGH (ref 6–20)
CO2: 30 mmol/L (ref 22–32)
Calcium: 10.5 mg/dL — ABNORMAL HIGH (ref 8.9–10.3)
Chloride: 80 mmol/L — ABNORMAL LOW (ref 98–111)
Creatinine, Ser: 2.27 mg/dL — ABNORMAL HIGH (ref 0.61–1.24)
GFR, Estimated: 36 mL/min — ABNORMAL LOW (ref >60)
Glucose, Bld: 144 mg/dL — ABNORMAL HIGH (ref 70–99)
Potassium: 3 mmol/L — ABNORMAL LOW (ref 3.5–5.1)
Sodium: 127 mmol/L — ABNORMAL LOW (ref 135–145)
Total Bilirubin: 1.2 mg/dL (ref 0.0–1.2)
Total Protein: 8.9 g/dL — ABNORMAL HIGH (ref 6.5–8.1)

## 2024-08-16 LAB — URINALYSIS, ROUTINE W REFLEX MICROSCOPIC
Glucose, UA: NEGATIVE mg/dL
Ketones, ur: 15 mg/dL — AB
Leukocytes,Ua: NEGATIVE
Nitrite: NEGATIVE
Protein, ur: 100 mg/dL — AB
Specific Gravity, Urine: 1.025 (ref 1.005–1.030)
pH: 6 (ref 5.0–8.0)

## 2024-08-16 LAB — MAGNESIUM: Magnesium: 2.7 mg/dL — ABNORMAL HIGH (ref 1.7–2.4)

## 2024-08-16 LAB — CBC
HCT: 51.1 % (ref 39.0–52.0)
Hemoglobin: 18.1 g/dL — ABNORMAL HIGH (ref 13.0–17.0)
MCH: 30.2 pg (ref 26.0–34.0)
MCHC: 35.4 g/dL (ref 30.0–36.0)
MCV: 85.3 fL (ref 80.0–100.0)
Platelets: 433 K/uL — ABNORMAL HIGH (ref 150–400)
RBC: 5.99 MIL/uL — ABNORMAL HIGH (ref 4.22–5.81)
RDW: 12.3 % (ref 11.5–15.5)
WBC: 18.3 K/uL — ABNORMAL HIGH (ref 4.0–10.5)
nRBC: 0 % (ref 0.0–0.2)

## 2024-08-16 LAB — LIPASE, BLOOD: Lipase: 38 U/L (ref 11–51)

## 2024-08-16 MED ORDER — SODIUM CHLORIDE 0.9 % IV BOLUS
1000.0000 mL | Freq: Once | INTRAVENOUS | Status: AC
  Administered 2024-08-16: 1000 mL via INTRAVENOUS

## 2024-08-16 MED ORDER — POTASSIUM CHLORIDE CRYS ER 20 MEQ PO TBCR
40.0000 meq | EXTENDED_RELEASE_TABLET | Freq: Once | ORAL | Status: AC
  Administered 2024-08-16: 40 meq via ORAL
  Filled 2024-08-16: qty 2

## 2024-08-16 MED ORDER — METOCLOPRAMIDE HCL 5 MG/ML IJ SOLN
10.0000 mg | Freq: Once | INTRAMUSCULAR | Status: AC
  Administered 2024-08-16: 10 mg via INTRAVENOUS
  Filled 2024-08-16: qty 2

## 2024-08-16 MED ORDER — HYDROMORPHONE HCL 1 MG/ML IJ SOLN
0.5000 mg | Freq: Once | INTRAMUSCULAR | Status: AC
  Administered 2024-08-16: 0.5 mg via INTRAVENOUS
  Filled 2024-08-16: qty 1

## 2024-08-16 MED ORDER — SODIUM CHLORIDE 0.9 % IV BOLUS
500.0000 mL | Freq: Once | INTRAVENOUS | Status: AC
  Administered 2024-08-16: 500 mL via INTRAVENOUS

## 2024-08-16 NOTE — ED Provider Notes (Cosign Needed Addendum)
 [place] [facility] Provider Note   CSN: [id number] Arrival date & time: 08/16/24  1226     Patient presents with: Abdominal Pain   Carlos Guzman is a 38 y.o. male who presents to the emergency department with a chief complaint of ongoing nausea, vomiting, as well as lower back pain.  Patient was seen recently for same approximately 1 week ago and states that he has had similar episodes in the past as well. States that normally when he comes in he improves with medications and fluids however when returning home he states that his symptoms restarted. During previous encounter patient was diagnosed with a possible urinary tract infection, was given fluids, nausea medication, as well as pain medication, and ultimately discharged after feeling better.  Patient states that once he left the emergency department the nausea and vomiting returned and that he has been unable to keep anything down for multiple days.  Denies fever, chills, chest pain, shortness of breath, urinary symptoms.  Patient denies abdominal pain also.  Past medical history significant for polysubstance abuse, acute kidney injury, HIV, cannabinoid hyperemesis syndrome, hypokalemia, rhabdomyolysis, intractable nausea and vomiting, prolonged QT syndrome, etc. Patient states that he did not realize he was sent home with antibiotics for possible UTI and never picked them up from the pharmacy. Patient does appreciate substance use, specifically marijuana stating that he last smoked 2 days ago. Patient states he currently does not have a primary care provider. Denies IV drug use.     Abdominal Pain      Prior to Admission medications   Medication Sig Start Date End Date Taking? Authorizing Provider  amLODipine  (NORVASC ) 5 MG tablet Take 1 tablet (5 mg total) by mouth daily. Patient not taking: Reported on 07/18/2022 05/08/22   Ruthell Lonni FALCON, PA-C  Capsaicin -Menthol-Methyl Sal (CAPSAICIN -METHYL  SAL-MENTHOL) 0.025-1-12 % CREA Apply 1 Application topically 3 (three) times daily as needed. 08/07/24   Elnor Savant A, DO  DOVATO  50-300 MG tablet Take 1 tablet by mouth daily. 07/16/21   [provider]  metoCLOPramide  (REGLAN ) 10 MG tablet Take 1 tablet (10 mg total) by mouth 3 (three) times daily with meals. 01/15/23 01/15/24  Flint Sonny POUR, PA-C  omeprazole  (PRILOSEC) 40 MG capsule Take 40 mg by mouth daily.    [provider]  ondansetron  (ZOFRAN -ODT) 4 MG disintegrating tablet Take 1 tablet (4 mg total) by mouth every 8 (eight) hours as needed for nausea or vomiting. 08/07/24   Elnor Savant LABOR, DO  promethazine  (PHENERGAN ) 25 MG suppository Place 1 suppository (25 mg total) rectally every 6 (six) hours as needed for nausea or vomiting. 08/07/24   Elnor Savant LABOR, DO  sucralfate  (CARAFATE ) 1 g tablet Take 1 tablet (1 g total) by mouth 4 (four) times daily -  with meals and at bedtime. 06/10/19 06/10/19  Molpus, John, MD    Allergies: Droperidol     Review of Systems  Gastrointestinal:  Positive for abdominal pain.    Updated Vital Signs BP (!) 150/94   Pulse 88   Temp 98.2 F (36.8 C) (Oral)   Resp 18   Ht 6' 1 (1.854 m)   Wt 79.4 kg   SpO2 100%   BMI 23.09 kg/m   Physical Exam Vitals and nursing note reviewed.  Constitutional:      General: He is awake. He is not in acute distress.    Appearance: Normal appearance. He is well-developed. He is not toxic-appearing or diaphoretic.  Comments: Patient visually uncomfortable, patient appears dry  HENT:     Head: Normocephalic and atraumatic.  Eyes:     General: No scleral icterus. Cardiovascular:     Rate and Rhythm: Normal rate and regular rhythm.  Pulmonary:     Effort: Pulmonary effort is normal. No respiratory distress.     Breath sounds: No wheezing, rhonchi or rales.  Abdominal:     General: Abdomen is flat. There is no distension.     Palpations: Abdomen is soft.     Tenderness: There is no  abdominal tenderness. There is no right CVA tenderness, left CVA tenderness or guarding.  Musculoskeletal:        General: Normal range of motion.     Right lower leg: No edema.     Left lower leg: No edema.     Comments: Grossly normal range of motion of all 4 extremities, mild tenderness with palpation of generalized low back  Skin:    General: Skin is warm and dry.     Capillary Refill: Capillary refill takes less than 2 seconds.  Neurological:     General: No focal deficit present.     Mental Status: He is alert and oriented to person, place, and time.     Gait: Gait normal.  Psychiatric:        Mood and Affect: Mood normal.        Behavior: Behavior normal. Behavior is cooperative.     (all labs ordered are listed, but only abnormal results are displayed) Labs Reviewed  CBC - Abnormal; Notable for the following components:      Result Value   WBC 18.3 (*)    RBC 5.99 (*)    Hemoglobin 18.1 (*)    Platelets 433 (*)    All other components within normal limits  URINALYSIS, ROUTINE W REFLEX MICROSCOPIC - Abnormal; Notable for the following components:   Hgb urine dipstick TRACE (*)    Bilirubin Urine SMALL (*)    Ketones, ur 15 (*)    Protein, ur 100 (*)    All other components within normal limits  URINE DRUG SCREEN - Abnormal; Notable for the following components:   Cocaine POSITIVE (*)    Tetrahydrocannabinol POSITIVE (*)    All other components within normal limits  COMPREHENSIVE METABOLIC PANEL WITH GFR - Abnormal; Notable for the following components:   Sodium 127 (*)    Potassium 3.0 (*)    Chloride 80 (*)    Glucose, Bld 144 (*)    BUN 44 (*)    Creatinine, Ser 2.27 (*)    Calcium  10.5 (*)    Total Protein 8.9 (*)    Albumin 5.1 (*)    GFR, Estimated 36 (*)    Anion gap 17 (*)    All other components within normal limits  URINALYSIS, MICROSCOPIC (REFLEX) - Abnormal; Notable for the following components:   Bacteria, UA RARE (*)    All other components  within normal limits  MAGNESIUM  - Abnormal; Notable for the following components:   Magnesium  2.7 (*)    All other components within normal limits  LIPASE, BLOOD    EKG: EKG Interpretation Date/Time:  Monday August 16 2024 13:23:01 EST Ventricular Rate:  110 PR Interval:    QRS Duration:  99 QT Interval:  397 QTC Calculation: 538 R Axis:   86  Text Interpretation: Sinus tachycardia Non-specific ST-t changes Confirmed by Bernard Drivers ([id number]) on 08/16/2024 1:37:54 PM  Radiology: No results found.  Procedures   Medications Ordered in the ED  sodium chloride  0.9 % bolus 1,000 mL (0 mLs Intravenous Stopped 08/16/24 1657)  HYDROmorphone  (DILAUDID ) injection 0.5 mg (0.5 mg Intravenous Given 08/16/24 1346)  metoCLOPramide  (REGLAN ) injection 10 mg (10 mg Intravenous Given 08/16/24 1344)  sodium chloride  0.9 % bolus 1,000 mL (0 mLs Intravenous Stopped 08/16/24 1702)  potassium chloride  SA (KLOR-CON  M) CR tablet 40 mEq (40 mEq Oral Given 08/16/24 1516)  sodium chloride  0.9 % bolus 500 mL (0 mLs Intravenous Stopped 08/16/24 1744)  HYDROmorphone  (DILAUDID ) injection 0.5 mg (0.5 mg Intravenous Given 08/16/24 1701)                                    Medical Decision Making Amount and/or Complexity of Data Reviewed Labs: ordered.  Risk Prescription drug management.   Patient presents to the ED for concern of nausea, vomiting, low back pain, this involves an extensive number of treatment options, and is a complaint that carries with it a high risk of complications and morbidity.  The differential diagnosis includes cannabis hyperemesis syndrome, lactic acidosis, pyelonephritis, urinary tract infection, kidney stone, etc.   Co morbidities that complicate the patient evaluation  polysubstance abuse, acute kidney injury, HIV, cannabinoid hyperemesis syndrome, hypokalemia, rhabdomyolysis, intractable nausea and vomiting, prolonged QT syndrome   Additional history obtained:  Note from  08/07/2024 reviewed where patient was diagnosed with acute cystitis and sent home with antibiotics, however he never picked these antibiotics from the pharmacy.  When reviewing urine culture it does not appear to have any growth.  Patient presented at this time with similar symptoms to today including nausea, vomiting, as well as back pain.  Patient states that his back pain feels the same as it did 1 week ago. Denies urinary symptoms.    Lab Tests:  I Ordered, and personally interpreted labs.  The pertinent results include: CBC significant for leukocytosis 18.3, hemoglobin 18.1, platelets 433, sodium 127, potassium 3, chloride 80, creatinine 2.27, BUN 44, anion gap 17, magnesium  2.7, lipase 38, urinalysis significant for trace hemoglobin on urine dipstick, small bilirubin, 15 ketones, 100 protein, rare bacteria, UDS positive for cocaine and THC   Medicines ordered and prescription drug management:  I ordered medication including IV fluids for dehydration, Dilaudid  for pain, potassium for hypokalemia, Reglan  for nausea and vomiting Reevaluation of the patient after these medicines showed that the patient improved I have reviewed the patients home medicines and have made adjustments as needed   Test Considered:  CT abdomen pelvis: Deferred at this time as patient recently had a CT of his abdomen pelvis approximately 1 week ago with similar symptoms, patient states that symptoms have not worsened they just returned, based off of history, physical exam, as well as workup I think is unlikely that we find anything acute on the scan, patient is also improved with symptomatic treatment, denied significant pain on last reassessment   Critical Interventions:  None   Problem List / ED Course:  38 year old male, vital signs stable, presents to the emergency department with a chief complaint of ongoing nausea, vomiting, as well as low back pain On physical exam patient chronically ill-appearing,  abdomen is soft and nontender, generalized low back pain, patient does appear dry Labs significant for leukocytosis which I believe is due to vomiting as opposed to infection, hyponatremia, hypokalemia, acute kidney injury, as well as other findings including cocaine and THC positive, urine  is not consistent with UTI today, patient does also not have UTI-like symptoms, urine culture from a week ago showed no growth Patient never picked up antibiotics from the pharmacy, I will instruct the patient that he can pick up this prescription and finish it if he would like however urine today not overwhelming for infection Extensive fluid resuscitation completed with patient including 2-1/2 L of NS fluid for dehydration and hyponatremia, potassium also replaced, patient symptomatically treated with Reglan  with improvement, patient tolerating p.o. prior to discharge Considered admission for this patient however after extensive discussion patient would favor going home and states he will return if symptoms worsen again, I am okay with this plan as patient is tolerating p.o. at this time with stable vital signs, I did counsel the patient on drug use and stated that cannabis may be relating to his symptoms, I also notified the patient about his elevated kidney function however he still states he would prefer to go home as opposed to admission At this time I suspect that the low back pain may be due to excessive retching from vomiting as the patient states he has had extensive vomiting over the last week, no hematemesis however, again reassuring with negative imaging recently, considered possible spinous abscess but patient denies IV drug use and is afebrile, back pain is also generalized and improved while in the ED Return precautions given Patient discharged Most likely diagnosis at this time is possible cannabis hyperemesis syndrome resulting in acidosis, dehydration, AKI, hyponatremia, hypokalemia, patient improved  with symptomatic treatment, given strict return precautions, patient also given resources for PCP as well as nephrology    Reevaluation:  After the interventions noted above, I reevaluated the patient and found that they have :improved   Social Determinants of Health:  Polysubstance abuse   Dispostion:  After consideration of the diagnostic results and the patients response to treatment, I feel that the patient would benefit from discharge and outpatient therapies described, close monitoring of symptoms, follow-up with primary care provider and make them aware of all workup and findings, follow-up with specialist as well.     Final diagnoses:  Nausea and vomiting, unspecified vomiting type  Low back pain without sciatica, unspecified back pain laterality, unspecified chronicity  Acute kidney injury  Hypokalemia  Hyponatremia    ED Discharge Orders     None          Janetta Terrall FALCON, PA-C 08/16/24 2142    Janetta Terrall FALCON, [place] 08/16/24 2154

## 2024-08-16 NOTE — ED Triage Notes (Signed)
 Pt c/o ongoing abd pain, n/v x 2 weeks.  Recently seen here for same. Denies fever, diarrhea.

## 2024-08-16 NOTE — ED Notes (Signed)
 Asked Pt for urine, stated that  he does not have urinate at the moment will check back shortly

## 2024-08-16 NOTE — ED Notes (Signed)
 Pt alert and oriented X 4 at the time of discharge. RR even and unlabored. No acute distress noted. Pt verbalized understanding of discharge instructions as discussed. Pt ambulatory to lobby at time of discharge.

## 2024-08-16 NOTE — Discharge Instructions (Addendum)
 It was a pleasure taking care of you today.  Based on your history, physical exam, as well as your labs I feel you are safe for discharge at this time.  You were offered admission to the hospital however you declined.  Today you appeared very dehydrated due to your nausea and vomiting, because of this you received quite a bit of IV fluid.  Please continue to monitor your symptoms at home, if you develop severe nausea and vomiting again you will need to return to the emergency department.  I do recommend that you establish care with a primary care provider as soon as possible, I have also attached the information for a nephrologist to monitor your kidney function.  You may pick up your previously prescribed antibiotic from the pharmacy and take it as prescribed for your possible urinary tract infection.  Please continue to monitor your symptoms, if you develop any the following symptoms including but limited to fever, chills, worsening abdominal pain, worsening back pain, excessive nausea or vomiting or diarrhea, dehydration, or other concerning symptom please return to the emergency department or seek further medical care.  If symptoms worsen recommend follow-up within 48 hours.  Please try to establish care with a primary care provider within the next 1 to 2 weeks and have a visit. Please make sure you are eating a good diet and staying hydrated.

## 2024-08-19 NOTE — Telephone Encounter (Signed)
 Please line up his appointments next week- currently scheduled for Wednesday am therapy and Thursday pm urgent care.  Can we consolidate them to make less travel?  Can overbook [facility] urgent care (there are return pts booked into [facility] spots) or move therapy visit if that works

## 2024-10-07 ENCOUNTER — Other Ambulatory Visit: Payer: Self-pay

## 2024-10-07 ENCOUNTER — Encounter (HOSPITAL_BASED_OUTPATIENT_CLINIC_OR_DEPARTMENT_OTHER): Payer: Self-pay

## 2024-10-07 ENCOUNTER — Emergency Department (HOSPITAL_BASED_OUTPATIENT_CLINIC_OR_DEPARTMENT_OTHER)

## 2024-10-07 ENCOUNTER — Emergency Department (HOSPITAL_BASED_OUTPATIENT_CLINIC_OR_DEPARTMENT_OTHER)
Admission: EM | Admit: 2024-10-07 | Discharge: 2024-10-07 | Disposition: A | Discharge status: Home / Self Care | Attending: Emergency Medicine | Admitting: Emergency Medicine

## 2024-10-07 ENCOUNTER — Other Ambulatory Visit (HOSPITAL_BASED_OUTPATIENT_CLINIC_OR_DEPARTMENT_OTHER): Payer: Self-pay

## 2024-10-07 DIAGNOSIS — R112 Nausea with vomiting, unspecified: Secondary | ICD-10-CM | POA: Insufficient documentation

## 2024-10-07 DIAGNOSIS — W000XXA Fall on same level due to ice and snow, initial encounter: Secondary | ICD-10-CM | POA: Insufficient documentation

## 2024-10-07 DIAGNOSIS — R911 Solitary pulmonary nodule: Secondary | ICD-10-CM | POA: Insufficient documentation

## 2024-10-07 DIAGNOSIS — Z21 Asymptomatic human immunodeficiency virus [HIV] infection status: Secondary | ICD-10-CM | POA: Diagnosis not present

## 2024-10-07 DIAGNOSIS — S298XXA Other specified injuries of thorax, initial encounter: Secondary | ICD-10-CM

## 2024-10-07 DIAGNOSIS — S20219A Contusion of unspecified front wall of thorax, initial encounter: Secondary | ICD-10-CM | POA: Diagnosis not present

## 2024-10-07 DIAGNOSIS — R918 Other nonspecific abnormal finding of lung field: Secondary | ICD-10-CM

## 2024-10-07 DIAGNOSIS — M545 Low back pain, unspecified: Secondary | ICD-10-CM | POA: Insufficient documentation

## 2024-10-07 DIAGNOSIS — R10A2 Flank pain, left side: Secondary | ICD-10-CM | POA: Diagnosis not present

## 2024-10-07 DIAGNOSIS — S299XXA Unspecified injury of thorax, initial encounter: Secondary | ICD-10-CM | POA: Diagnosis present

## 2024-10-07 LAB — CBC WITH DIFFERENTIAL/PLATELET
Abs Immature Granulocytes: 0.12 10*3/uL — ABNORMAL HIGH (ref 0.00–0.07)
Basophils Absolute: 0.1 10*3/uL (ref 0.0–0.1)
Basophils Relative: 0 %
Eosinophils Absolute: 0 10*3/uL (ref 0.0–0.5)
Eosinophils Relative: 0 %
HCT: 49.6 % (ref 39.0–52.0)
Hemoglobin: 17.6 g/dL — ABNORMAL HIGH (ref 13.0–17.0)
Immature Granulocytes: 1 %
Lymphocytes Relative: 14 %
Lymphs Abs: 3 10*3/uL (ref 0.7–4.0)
MCH: 31.2 pg (ref 26.0–34.0)
MCHC: 35.5 g/dL (ref 30.0–36.0)
MCV: 87.8 fL (ref 80.0–100.0)
Monocytes Absolute: 1.1 10*3/uL — ABNORMAL HIGH (ref 0.1–1.0)
Monocytes Relative: 5 %
Neutro Abs: 17.2 10*3/uL — ABNORMAL HIGH (ref 1.7–7.7)
Neutrophils Relative %: 80 %
Platelets: 409 10*3/uL — ABNORMAL HIGH (ref 150–400)
RBC: 5.65 MIL/uL (ref 4.22–5.81)
RDW: 12.9 % (ref 11.5–15.5)
WBC: 21.5 10*3/uL — ABNORMAL HIGH (ref 4.0–10.5)
nRBC: 0 % (ref 0.0–0.2)

## 2024-10-07 LAB — COMPREHENSIVE METABOLIC PANEL WITH GFR
ALT: 19 U/L (ref 0–44)
AST: 19 U/L (ref 15–41)
Albumin: 5.3 g/dL — ABNORMAL HIGH (ref 3.5–5.0)
Alkaline Phosphatase: 79 U/L (ref 38–126)
Anion gap: 22 — ABNORMAL HIGH (ref 5–15)
BUN: 34 mg/dL — ABNORMAL HIGH (ref 6–20)
CO2: 18 mmol/L — ABNORMAL LOW (ref 22–32)
Calcium: 10.8 mg/dL — ABNORMAL HIGH (ref 8.9–10.3)
Chloride: 93 mmol/L — ABNORMAL LOW (ref 98–111)
Creatinine, Ser: 2.09 mg/dL — ABNORMAL HIGH (ref 0.61–1.24)
GFR, Estimated: 40 mL/min — ABNORMAL LOW
Glucose, Bld: 152 mg/dL — ABNORMAL HIGH (ref 70–99)
Potassium: 3.9 mmol/L (ref 3.5–5.1)
Sodium: 133 mmol/L — ABNORMAL LOW (ref 135–145)
Total Bilirubin: 0.8 mg/dL (ref 0.0–1.2)
Total Protein: 9.3 g/dL — ABNORMAL HIGH (ref 6.5–8.1)

## 2024-10-07 LAB — LIPASE, BLOOD: Lipase: 23 U/L (ref 11–51)

## 2024-10-07 MED ORDER — ONDANSETRON HCL 4 MG/2ML IJ SOLN
4.0000 mg | Freq: Once | INTRAMUSCULAR | Status: AC
  Administered 2024-10-07: 4 mg via INTRAVENOUS
  Filled 2024-10-07: qty 2

## 2024-10-07 MED ORDER — HYDROMORPHONE HCL 1 MG/ML IJ SOLN
1.0000 mg | Freq: Once | INTRAMUSCULAR | Status: AC
  Administered 2024-10-07: 1 mg via INTRAVENOUS
  Filled 2024-10-07: qty 1

## 2024-10-07 MED ORDER — SODIUM CHLORIDE 0.9 % IV BOLUS
1000.0000 mL | Freq: Once | INTRAVENOUS | Status: AC
  Administered 2024-10-07: 1000 mL via INTRAVENOUS

## 2024-10-07 MED ORDER — PROMETHAZINE HCL 25 MG RE SUPP
25.0000 mg | Freq: Four times a day (QID) | RECTAL | 0 refills | Status: AC | PRN
  Filled 2024-10-07: qty 12, 3d supply, fill #0

## 2024-10-07 MED ORDER — CYCLOBENZAPRINE HCL 10 MG PO TABS
10.0000 mg | ORAL_TABLET | Freq: Two times a day (BID) | ORAL | 0 refills | Status: AC | PRN
  Filled 2024-10-07: qty 20, 10d supply, fill #0

## 2024-10-07 MED ORDER — ONDANSETRON 4 MG PO TBDP
4.0000 mg | ORAL_TABLET | Freq: Three times a day (TID) | ORAL | 0 refills | Status: AC | PRN
  Filled 2024-10-07: qty 20, 7d supply, fill #0

## 2024-10-07 NOTE — ED Triage Notes (Addendum)
 Pt having L lower back pain, states I feel like my ribs are broken. Pain x few days. +N/V, +cocaine, marijuana per pt

## 2024-10-07 NOTE — ED Notes (Signed)
 ED Provider at bedside.

## 2024-10-07 NOTE — ED Provider Notes (Signed)
 " [facility] Provider Note   CSN: [id number] Arrival date & time: 10/07/24  1200     Patient presents with: Flank Pain   Carlos Guzman is a 39 y.o. male.   Patient here with left flank pain after fall on ice yesterday.  He is having some discomfort in his left lower back as well.  No weakness numbness tingling.  He is having some acute on chronic abdominal pain as well with nausea and vomiting.  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  Continues to smoke and uses cocaine.  he is mostly concerned about having possibly broken ribs.  Left makes it worse or better.  Denies any fevers or chills.  Denies any pain with urination.  No kidney stone history.  History of pancreatitis polysubstance abuse HIV.  The history is provided by the patient.       Prior to Admission medications  Medication Sig Start Date End Date Taking? Authorizing Provider  cyclobenzaprine  (FLEXERIL ) 10 MG tablet Take 1 tablet (10 mg total) by mouth 2 (two) times daily as needed for muscle spasms. 10/07/24  Yes Eugena Rhue, DO  ondansetron  (ZOFRAN -ODT) 4 MG disintegrating tablet Take 1 tablet (4 mg total) by mouth every 8 (eight) hours as needed. 10/07/24  Yes Uziel Covault, DO  amLODipine  (NORVASC ) 5 MG tablet Take 1 tablet (5 mg total) by mouth daily. Patient not taking: Reported on 07/18/2022 05/08/22   Ruthell Lonni FALCON, PA-C  Capsaicin -Menthol-Methyl Sal (CAPSAICIN -METHYL SAL-MENTHOL) 0.025-1-12 % CREA Apply 1 Application topically 3 (three) times daily as needed. 08/07/24   Elnor Jayson LABOR, DO  DOVATO  50-300 MG tablet Take 1 tablet by mouth daily. 07/16/21   [provider]  metoCLOPramide  (REGLAN ) 10 MG tablet Take 1 tablet (10 mg total) by mouth 3 (three) times daily with meals. 01/15/23 01/15/24  Flint Sonny POUR, PA-C  omeprazole  (PRILOSEC) 40 MG capsule Take 40 mg by mouth daily.    [provider]  promethazine  (PHENERGAN ) 25 MG suppository Place 1  suppository (25 mg total) rectally every 6 (six) hours as needed for nausea or vomiting. 10/07/24   Jahanna Raether, DO  sucralfate  (CARAFATE ) 1 g tablet Take 1 tablet (1 g total) by mouth 4 (four) times daily -  with meals and at bedtime. 06/10/19 06/10/19  Molpus, John, MD    Allergies: Droperidol     Review of Systems  Updated Vital Signs BP (!) 149/86   Pulse (!) 101   Temp 97.8 F (36.6 C) (Oral)   Resp 12   Ht 6' 1 (1.854 m)   Wt 83.9 kg   SpO2 100%   BMI 24.41 kg/m   Physical Exam Vitals and nursing note reviewed.  Constitutional:      General: He is not in acute distress.    Appearance: He is well-developed. He is not ill-appearing.  HENT:     Head: Normocephalic and atraumatic.     Nose: Nose normal.     Mouth/Throat:     Mouth: Mucous membranes are moist.  Eyes:     Extraocular Movements: Extraocular movements intact.     Conjunctiva/sclera: Conjunctivae normal.     Pupils: Pupils are equal, round, and reactive to light.  Cardiovascular:     Rate and Rhythm: Normal rate and regular rhythm.     Pulses: Normal pulses.     Heart sounds: Normal heart sounds. No murmur heard. Pulmonary:     Effort: Pulmonary effort is normal. No respiratory distress.  Breath sounds: Normal breath sounds.  Abdominal:     General: Abdomen is flat.     Palpations: Abdomen is soft.     Tenderness: There is no abdominal tenderness.  Musculoskeletal:        General: Tenderness present. No swelling.     Cervical back: Normal range of motion and neck supple.     Comments: Tenderness to the left CVA, tenderness to the paraspinal lumbar muscles on the left  Skin:    General: Skin is warm and dry.     Capillary Refill: Capillary refill takes less than 2 seconds.  Neurological:     General: No focal deficit present.     Mental Status: He is alert and oriented to person, place, and time.     Cranial Nerves: No cranial nerve deficit.     Sensory: No sensory deficit.     Motor: No  weakness.     Coordination: Coordination normal.  Psychiatric:        Mood and Affect: Mood normal.     (all labs ordered are listed, but only abnormal results are displayed) Labs Reviewed  CBC WITH DIFFERENTIAL/PLATELET - Abnormal; Notable for the following components:      Result Value   WBC 21.5 (*)    Hemoglobin 17.6 (*)    Platelets 409 (*)    Neutro Abs 17.2 (*)    Monocytes Absolute 1.1 (*)    Abs Immature Granulocytes 0.12 (*)    All other components within normal limits  COMPREHENSIVE METABOLIC PANEL WITH GFR - Abnormal; Notable for the following components:   Sodium 133 (*)    Chloride 93 (*)    CO2 18 (*)    Glucose, Bld 152 (*)    BUN 34 (*)    Creatinine, Ser 2.09 (*)    Calcium  10.8 (*)    Total Protein 9.3 (*)    Albumin 5.3 (*)    GFR, Estimated 40 (*)    Anion gap 22 (*)    All other components within normal limits  LIPASE, BLOOD    EKG: EKG Interpretation Date/Time:  Thursday October 07 2024 12:34:02 EST Ventricular Rate:  111 PR Interval:  144 QRS Duration:  97 QT Interval:  339 QTC Calculation: 461 R Axis:   75  Text Interpretation: Sinus tachycardia Confirmed by Ruthe Cornet [phone]) on 10/07/2024 12:36:53 PM  Radiology: CT Chest Wo Contrast Result Date: 10/07/2024 CLINICAL DATA:  Four day history of left rib pain EXAM: CT CHEST WITHOUT CONTRAST TECHNIQUE: Multidetector CT imaging of the chest was performed following the standard protocol without IV contrast. RADIATION DOSE REDUCTION: This exam was performed according to the departmental dose-optimization program which includes automated exposure control, adjustment of the mA and/or kV according to patient size and/or use of iterative reconstruction technique. COMPARISON:  None Available. FINDINGS: Cardiovascular: Normal heart size. No significant pericardial fluid/thickening. Great vessels are normal in course and caliber. Mediastinum/Nodes: Imaged thyroid  gland without nodules meeting criteria  for imaging follow-up by size. Normal esophagus. No pathologically enlarged axillary, supraclavicular, mediastinal, or hilar lymph nodes. Lungs/Pleura: The central airways are patent. Diffuse, ill-defined centrilobular nodules throughout both lungs. No focal consolidation. Additional scattered solid pulmonary nodules measuring up to 2 mm, for example right upper lobe (3:81). No pneumothorax. No pleural effusion. Upper abdomen: Please see separately dictated CT abdomen and pelvis report for detailed findings. Musculoskeletal: Sclerotic focus in the left lateral sixth rib (3:89), likely benign bone island. IMPRESSION: 1. Diffuse, ill-defined centrilobular nodules throughout  both lungs, which can be seen in the setting of smoking-related respiratory bronchiolitis. 2. Additional scattered solid pulmonary nodules measuring up to 2 mm. No follow-up needed if patient is low-risk (and has no known or suspected primary neoplasm). Non-contrast chest CT can be considered in 12 months if patient is high-risk. This recommendation follows the consensus statement: Guidelines for Management of Incidental Pulmonary Nodules Detected on CT Images: From the Fleischner Society 2017; Radiology 2017; 284:228-243. 3. No acute displaced rib fractures. Electronically Signed   By: Limin  Xu M.D.   On: 10/07/2024 14:04   CT Renal Stone Study Result Date: 10/07/2024 CLINICAL DATA:  Left flank pain associated with nausea and vomiting for approximately 4 days EXAM: CT ABDOMEN AND PELVIS WITHOUT CONTRAST TECHNIQUE: Multidetector CT imaging of the abdomen and pelvis was performed following the standard protocol without IV contrast. RADIATION DOSE REDUCTION: This exam was performed according to the departmental dose-optimization program which includes automated exposure control, adjustment of the mA and/or kV according to patient size and/or use of iterative reconstruction technique. COMPARISON:  CT abdomen and pelvis with IV contrast 08/07/2024  FINDINGS: Lower chest: The lung bases are clear bilaterally. No mass or pleural effusion. Hepatobiliary: Unremarkable liver. No evidence of intrahepatic biliary dilatation. Gallbladder unremarkable and without gallstones. Pancreas: Unremarkable in size and configuration in this study without IV contrast. Spleen: Normal in size. Adrenals/Urinary Tract: No hydronephrosis or hydroureter. No evidence of kidney stones. No evidence of bladder stones. Stomach/Bowel: Grossly unremarkable without oral contrast. Vascular/Lymphatic: No abnormal lymphadenopathy. No significant subcutaneous edema. Reproductive: Grossly unremarkable this unenhanced study. Other: Musculoskeletal: No abnormal mass or acute destruction of the musculoskeletal system. IMPRESSION: Grossly unremarkable. No hydronephrosis or evidence of urinary tract stone. Electronically Signed   By: Cordella Banner   On: 10/07/2024 13:37     Procedures   Medications Ordered in the ED  HYDROmorphone  (DILAUDID ) injection 1 mg (1 mg Intravenous Given 10/07/24 1228)  ondansetron  (ZOFRAN ) injection 4 mg (4 mg Intravenous Given 10/07/24 1227)  sodium chloride  0.9 % bolus 1,000 mL (1,000 mLs Intravenous New Bag/Given 10/07/24 1320)  HYDROmorphone  (DILAUDID ) injection 1 mg (1 mg Intravenous Given 10/07/24 1335)                                    Medical Decision Making Amount and/or Complexity of Data Reviewed Labs: ordered. Radiology: ordered.  Risk Prescription drug management.   Carlos Guzman is here with left-sided flank pain after fall but also some nausea and vomiting chronic abdominal pain.  Continues to use marijuana cocaine.  History of HIV with last counts being unremarkable, hyperemesis from marijuana.  Fell on ice yesterday mostly hit in the left lower ribs and back.  Differential diagnosis likely contusion versus rib fracture.  Abdominal pain somewhat chronic.  Is not very tender on exam but somewhat difficult to examine him due to rib pain.   Will check abdominal labs with CBC CMP lipase get CT scan of his chest abdomen and pelvis to look for traumatic processes and other intra-abdominal processes.  Will give him Dilaudid  Zofran  and fluid bolus and reevaluate.  He did not hit his head or lose consciousness.  Is not on any blood thinners.  Mild leukocytosis but baseline per patient basically.  Lab work is otherwise unremarkable.  Maybe little bit of dehydration on labs but overall close to his baseline.  No rib fractures on CT.  Intra-abdominal CT is  unremarkable as well.  He does have some scattered pulmonary nodules.  Made aware of this and need for follow-up CT likely in the future.  Pain is much improved will prescribe nausea medicine muscle relaxant for home.  I do think he has rib contusion.  Will prescribe his nausea medicine for his chronic abdominal pain and nausea and vomiting.  Discharged in good condition.  This chart was dictated using voice recognition software.  Despite best efforts to proofread,  errors can occur which can change the documentation meaning.      Final diagnoses:  Left flank pain  Contusion of rib, initial encounter  Nausea and vomiting, unspecified vomiting type  Pulmonary nodules    ED Discharge Orders          Ordered    ondansetron  (ZOFRAN -ODT) 4 MG disintegrating tablet  Every 8 hours PRN        10/07/24 1353    promethazine  (PHENERGAN ) 25 MG suppository  Every 6 hours PRN        10/07/24 1353    cyclobenzaprine  (FLEXERIL ) 10 MG tablet  2 times daily PRN        10/07/24 1353               Ruthe Cornet, DO 10/07/24 1412  "

## 2024-10-07 NOTE — Discharge Instructions (Addendum)
 Tylenol  1000 mg every 6 hours as needed for pain.  I prescribed you muscle relaxant called Flexeril  to take for pain as well.  I have written you for refills of Zofran  and Phenergan .  Also we did incidentally find some pulmonary nodules on your CT of your chest today.  Talk with your primary care doctor about following up these on CT scan in the future to make sure that these stay benign appearing.
